# Patient Record
Sex: Female | Born: 1935 | Race: White | Hispanic: No | Smoking: Never smoker
Health system: Southern US, Community
[De-identification: ages and names within clinical notes are randomized; demographics above are authoritative.]

## PROBLEM LIST (undated history)

## (undated) DIAGNOSIS — F329 Major depressive disorder, single episode, unspecified: Secondary | ICD-10-CM

## (undated) DIAGNOSIS — F32A Depression, unspecified: Secondary | ICD-10-CM

## (undated) DIAGNOSIS — C300 Malignant neoplasm of nasal cavity: Secondary | ICD-10-CM

## (undated) DIAGNOSIS — E785 Hyperlipidemia, unspecified: Secondary | ICD-10-CM

## (undated) DIAGNOSIS — I10 Essential (primary) hypertension: Secondary | ICD-10-CM

## (undated) HISTORY — PX: CARPAL TUNNEL RELEASE: SHX101

## (undated) HISTORY — DX: Major depressive disorder, single episode, unspecified: F32.9

## (undated) HISTORY — PX: CATARACT EXTRACTION: SUR2

## (undated) HISTORY — DX: Hyperlipidemia, unspecified: E78.5

## (undated) HISTORY — DX: Depression, unspecified: F32.A

## (undated) HISTORY — DX: Essential (primary) hypertension: I10

---

## 2004-11-05 ENCOUNTER — Ambulatory Visit: Payer: Self-pay | Admitting: Internal Medicine

## 2005-03-05 ENCOUNTER — Ambulatory Visit: Payer: Self-pay | Admitting: Internal Medicine

## 2005-07-12 ENCOUNTER — Ambulatory Visit: Payer: Self-pay | Admitting: Internal Medicine

## 2005-08-28 ENCOUNTER — Ambulatory Visit: Payer: Self-pay | Admitting: Internal Medicine

## 2005-11-12 ENCOUNTER — Ambulatory Visit: Payer: Self-pay | Admitting: Internal Medicine

## 2006-02-11 ENCOUNTER — Ambulatory Visit: Payer: Self-pay | Admitting: Internal Medicine

## 2006-05-13 ENCOUNTER — Ambulatory Visit: Payer: Self-pay | Admitting: Internal Medicine

## 2006-08-18 ENCOUNTER — Encounter (INDEPENDENT_AMBULATORY_CARE_PROVIDER_SITE_OTHER): Payer: Self-pay | Admitting: Specialist

## 2006-08-18 ENCOUNTER — Ambulatory Visit (HOSPITAL_COMMUNITY): Admission: RE | Admit: 2006-08-18 | Discharge: 2006-08-18 | Payer: Self-pay | Admitting: Ophthalmology

## 2006-09-14 ENCOUNTER — Ambulatory Visit: Payer: Self-pay | Admitting: Internal Medicine

## 2006-09-30 ENCOUNTER — Ambulatory Visit: Payer: Self-pay | Admitting: Internal Medicine

## 2006-09-30 LAB — CONVERTED CEMR LAB
ALT: 19 units/L (ref 0–40)
AST: 20 units/L (ref 0–37)
Albumin: 3.9 g/dL (ref 3.5–5.2)
Alkaline Phosphatase: 85 units/L (ref 39–117)
BUN: 26 mg/dL — ABNORMAL HIGH (ref 6–23)
Bilirubin, Direct: 0.2 mg/dL (ref 0.0–0.3)
CO2: 24 meq/L (ref 19–32)
Calcium: 9.5 mg/dL (ref 8.4–10.5)
Chloride: 107 meq/L (ref 96–112)
Chol/HDL Ratio, serum: 3
Cholesterol: 188 mg/dL (ref 0–200)
Creatinine, Ser: 1.2 mg/dL (ref 0.4–1.2)
GFR calc non Af Amer: 47 mL/min
Glomerular Filtration Rate, Af Am: 57 mL/min/{1.73_m2}
Glucose, Bld: 142 mg/dL — ABNORMAL HIGH (ref 70–99)
HDL: 62.3 mg/dL (ref >39.0)
Hgb A1c MFr Bld: 7.1 % — ABNORMAL HIGH (ref 4.6–6.0)
LDL Cholesterol: 111 mg/dL — ABNORMAL HIGH (ref 0–99)
Potassium: 5.4 meq/L — ABNORMAL HIGH (ref 3.5–5.1)
Sodium: 139 meq/L (ref 135–145)
Total Bilirubin: 0.8 mg/dL (ref 0.3–1.2)
Total Protein: 6.7 g/dL (ref 6.0–8.3)
Triglyceride fasting, serum: 75 mg/dL (ref 0–149)
VLDL: 15 mg/dL (ref 0–40)

## 2006-12-30 ENCOUNTER — Encounter: Payer: Self-pay | Admitting: Internal Medicine

## 2006-12-30 ENCOUNTER — Ambulatory Visit: Payer: Self-pay

## 2007-02-10 ENCOUNTER — Ambulatory Visit: Payer: Self-pay | Admitting: Internal Medicine

## 2007-02-10 LAB — CONVERTED CEMR LAB
ALT: 18 units/L (ref 0–40)
AST: 21 units/L (ref 0–37)
Albumin: 3.8 g/dL (ref 3.5–5.2)
Alkaline Phosphatase: 91 units/L (ref 39–117)
BUN: 22 mg/dL (ref 6–23)
Basophils Absolute: 0 10*3/uL (ref 0.0–0.1)
Basophils Relative: 0.5 % (ref 0.0–1.0)
Bilirubin, Direct: 0.1 mg/dL (ref 0.0–0.3)
CO2: 28 meq/L (ref 19–32)
Calcium: 9.2 mg/dL (ref 8.4–10.5)
Chloride: 109 meq/L (ref 96–112)
Cholesterol: 184 mg/dL (ref 0–200)
Creatinine, Ser: 1.1 mg/dL (ref 0.4–1.2)
Eosinophils Absolute: 0.1 10*3/uL (ref 0.0–0.6)
Eosinophils Relative: 1.9 % (ref 0.0–5.0)
GFR calc Af Amer: 63 mL/min
GFR calc non Af Amer: 52 mL/min
Glucose, Bld: 155 mg/dL — ABNORMAL HIGH (ref 70–99)
HCT: 36.9 % (ref 36.0–46.0)
HDL: 57.2 mg/dL (ref >39.0)
Hemoglobin: 12.7 g/dL (ref 12.0–15.0)
LDL Cholesterol: 105 mg/dL — ABNORMAL HIGH (ref 0–99)
Lymphocytes Relative: 25.7 % (ref 12.0–46.0)
MCHC: 34.2 g/dL (ref 30.0–36.0)
MCV: 87.6 fL (ref 78.0–100.0)
Monocytes Absolute: 0.5 10*3/uL (ref 0.2–0.7)
Monocytes Relative: 7 % (ref 3.0–11.0)
Neutro Abs: 4.8 10*3/uL (ref 1.4–7.7)
Neutrophils Relative %: 64.9 % (ref 43.0–77.0)
Platelets: 245 10*3/uL (ref 150–400)
Potassium: 4.9 meq/L (ref 3.5–5.1)
RBC: 4.22 M/uL (ref 3.87–5.11)
RDW: 12.5 % (ref 11.5–14.6)
Sodium: 145 meq/L (ref 135–145)
Total Bilirubin: 0.8 mg/dL (ref 0.3–1.2)
Total CHOL/HDL Ratio: 3.2
Total Protein: 7 g/dL (ref 6.0–8.3)
Triglycerides: 109 mg/dL (ref 0–149)
VLDL: 22 mg/dL (ref 0–40)
WBC: 7.3 10*3/uL (ref 4.5–10.5)

## 2007-05-15 ENCOUNTER — Encounter: Payer: Self-pay | Admitting: Internal Medicine

## 2007-05-15 DIAGNOSIS — E1169 Type 2 diabetes mellitus with other specified complication: Secondary | ICD-10-CM | POA: Insufficient documentation

## 2007-05-15 DIAGNOSIS — E11319 Type 2 diabetes mellitus with unspecified diabetic retinopathy without macular edema: Secondary | ICD-10-CM | POA: Insufficient documentation

## 2007-05-15 DIAGNOSIS — I10 Essential (primary) hypertension: Secondary | ICD-10-CM

## 2007-05-15 DIAGNOSIS — I152 Hypertension secondary to endocrine disorders: Secondary | ICD-10-CM | POA: Insufficient documentation

## 2007-05-15 DIAGNOSIS — E785 Hyperlipidemia, unspecified: Secondary | ICD-10-CM

## 2007-06-23 ENCOUNTER — Ambulatory Visit: Payer: Self-pay | Admitting: Internal Medicine

## 2007-06-23 LAB — CONVERTED CEMR LAB
Cholesterol, target level: 200 mg/dL
HDL goal, serum: 40 mg/dL
LDL Goal: 100 mg/dL

## 2007-06-27 LAB — CONVERTED CEMR LAB
ALT: 14 units/L (ref 0–35)
AST: 19 units/L (ref 0–37)
Albumin: 4.1 g/dL (ref 3.5–5.2)
Alkaline Phosphatase: 85 units/L (ref 39–117)
BUN: 15 mg/dL (ref 6–23)
Bilirubin, Direct: 0.1 mg/dL (ref 0.0–0.3)
CO2: 27 meq/L (ref 19–32)
Calcium: 9.7 mg/dL (ref 8.4–10.5)
Chloride: 106 meq/L (ref 96–112)
Cholesterol: 188 mg/dL (ref 0–200)
Creatinine, Ser: 1.1 mg/dL (ref 0.4–1.2)
GFR calc Af Amer: 62 mL/min
GFR calc non Af Amer: 52 mL/min
Glucose, Bld: 153 mg/dL — ABNORMAL HIGH (ref 70–99)
HDL: 55.8 mg/dL (ref >39.0)
Hgb A1c MFr Bld: 7.6 % — ABNORMAL HIGH (ref 4.6–6.0)
LDL Cholesterol: 113 mg/dL — ABNORMAL HIGH (ref 0–99)
Potassium: 5.5 meq/L — ABNORMAL HIGH (ref 3.5–5.1)
Sodium: 139 meq/L (ref 135–145)
Total Bilirubin: 0.8 mg/dL (ref 0.3–1.2)
Total CHOL/HDL Ratio: 3.4
Total Protein: 7.3 g/dL (ref 6.0–8.3)
Triglycerides: 95 mg/dL (ref 0–149)
VLDL: 19 mg/dL (ref 0–40)

## 2007-08-25 ENCOUNTER — Ambulatory Visit: Payer: Self-pay | Admitting: Internal Medicine

## 2007-11-10 ENCOUNTER — Ambulatory Visit: Payer: Self-pay | Admitting: Internal Medicine

## 2007-11-10 LAB — CONVERTED CEMR LAB
Albumin: 4.1 g/dL (ref 3.5–5.2)
Alkaline Phosphatase: 73 units/L (ref 39–117)
BUN: 19 mg/dL (ref 6–23)
CO2: 28 meq/L (ref 19–32)
GFR calc Af Amer: 56 mL/min
LDL Cholesterol: 84 mg/dL (ref 0–99)
Potassium: 4.5 meq/L (ref 3.5–5.1)
Total CHOL/HDL Ratio: 3
Total Protein: 7 g/dL (ref 6.0–8.3)
Triglycerides: 98 mg/dL (ref 0–149)
VLDL: 20 mg/dL (ref 0–40)

## 2007-12-24 ENCOUNTER — Ambulatory Visit: Payer: Self-pay | Admitting: Internal Medicine

## 2007-12-24 ENCOUNTER — Inpatient Hospital Stay (HOSPITAL_COMMUNITY): Admission: EM | Admit: 2007-12-24 | Discharge: 2007-12-26 | Payer: Self-pay | Admitting: Emergency Medicine

## 2007-12-25 ENCOUNTER — Encounter: Payer: Self-pay | Admitting: Internal Medicine

## 2007-12-25 ENCOUNTER — Ambulatory Visit: Payer: Self-pay | Admitting: Vascular Surgery

## 2008-01-03 ENCOUNTER — Ambulatory Visit: Payer: Self-pay | Admitting: Internal Medicine

## 2008-01-03 DIAGNOSIS — D649 Anemia, unspecified: Secondary | ICD-10-CM | POA: Insufficient documentation

## 2008-01-04 LAB — CONVERTED CEMR LAB
Eosinophils Absolute: 0.1 10*3/uL (ref 0.0–0.6)
Eosinophils Relative: 1.3 % (ref 0.0–5.0)
Ferritin: 52.2 ng/mL (ref 10.0–291.0)
HCT: 35.6 % — ABNORMAL LOW (ref 36.0–46.0)
Iron: 29 ug/dL — ABNORMAL LOW (ref 42–145)
MCV: 90.5 fL (ref 78.0–100.0)
Neutrophils Relative %: 67.9 % (ref 43.0–77.0)
RBC: 3.93 M/uL (ref 3.87–5.11)
Saturation Ratios: 9 % — ABNORMAL LOW (ref 20.0–50.0)
Transferrin: 230.8 mg/dL (ref >=212.0)
WBC: 8.4 10*3/uL (ref 4.5–10.5)

## 2008-01-08 LAB — CONVERTED CEMR LAB
Lymphocytes Relative: 24 % (ref 12–46)
Lymphs Abs: 2 10*3/uL (ref 0.7–4.0)
Neutro Abs: 5.6 10*3/uL (ref 1.7–7.7)
Neutrophils Relative %: 67 % (ref 43–77)
Platelets: 300 10*3/uL (ref 150–400)
WBC: 8.4 10*3/uL (ref 4.0–10.5)

## 2008-01-31 ENCOUNTER — Telehealth: Payer: Self-pay | Admitting: Internal Medicine

## 2008-01-31 ENCOUNTER — Ambulatory Visit: Payer: Self-pay | Admitting: Internal Medicine

## 2008-02-01 ENCOUNTER — Telehealth: Payer: Self-pay | Admitting: Internal Medicine

## 2008-02-01 ENCOUNTER — Ambulatory Visit: Payer: Self-pay | Admitting: Internal Medicine

## 2008-02-01 LAB — CONVERTED CEMR LAB: Potassium: 5.5 meq/L — ABNORMAL HIGH (ref 3.5–5.1)

## 2008-02-06 LAB — CONVERTED CEMR LAB
ALT: 16 units/L (ref 0–35)
Basophils Absolute: 0 10*3/uL (ref 0.0–0.1)
Bilirubin, Direct: 0.1 mg/dL (ref 0.0–0.3)
CO2: 28 meq/L (ref 19–32)
Calcium: 9.5 mg/dL (ref 8.4–10.5)
Chloride: 111 meq/L (ref 96–112)
Cholesterol: 169 mg/dL (ref 0–200)
Eosinophils Absolute: 0.1 10*3/uL (ref 0.0–0.7)
GFR calc non Af Amer: 47 mL/min
Hgb A1c MFr Bld: 6.9 % — ABNORMAL HIGH (ref 4.6–6.0)
LDL Cholesterol: 93 mg/dL (ref 0–99)
Lymphocytes Relative: 28.1 % (ref 12.0–46.0)
MCHC: 32.6 g/dL (ref 30.0–36.0)
MCV: 89.3 fL (ref 78.0–100.0)
Neutro Abs: 4.8 10*3/uL (ref 1.4–7.7)
Neutrophils Relative %: 62.2 % (ref 43.0–77.0)
RDW: 13.4 % (ref 11.5–14.6)
Sodium: 143 meq/L (ref 135–145)
Total Bilirubin: 0.8 mg/dL (ref 0.3–1.2)
Triglycerides: 92 mg/dL (ref 0–149)
VLDL: 18 mg/dL (ref 0–40)

## 2008-02-09 ENCOUNTER — Ambulatory Visit: Payer: Self-pay | Admitting: Internal Medicine

## 2008-02-09 LAB — CONVERTED CEMR LAB
CO2: 30 meq/L (ref 19–32)
Chloride: 104 meq/L (ref 96–112)
Creatinine, Ser: 1 mg/dL (ref 0.4–1.2)
GFR calc non Af Amer: 58 mL/min
Potassium: 4.3 meq/L (ref 3.5–5.1)

## 2008-02-12 ENCOUNTER — Encounter: Payer: Self-pay | Admitting: Internal Medicine

## 2008-03-15 ENCOUNTER — Ambulatory Visit: Payer: Self-pay | Admitting: Gastroenterology

## 2008-03-29 ENCOUNTER — Ambulatory Visit: Payer: Self-pay | Admitting: Gastroenterology

## 2008-03-29 ENCOUNTER — Encounter: Payer: Self-pay | Admitting: Gastroenterology

## 2008-03-29 LAB — HM COLONOSCOPY

## 2008-04-03 ENCOUNTER — Encounter: Payer: Self-pay | Admitting: Gastroenterology

## 2008-05-24 ENCOUNTER — Encounter: Payer: Self-pay | Admitting: Internal Medicine

## 2008-05-31 ENCOUNTER — Ambulatory Visit: Payer: Self-pay | Admitting: Internal Medicine

## 2008-05-31 LAB — CONVERTED CEMR LAB
Albumin: 4 g/dL (ref 3.5–5.2)
Alkaline Phosphatase: 83 units/L (ref 39–117)
BUN: 17 mg/dL (ref 6–23)
Calcium: 9.3 mg/dL (ref 8.4–10.5)
Cholesterol: 150 mg/dL (ref 0–200)
Creatinine, Ser: 1 mg/dL (ref 0.4–1.2)
GFR calc Af Amer: 70 mL/min
GFR calc non Af Amer: 57 mL/min
Glucose, Bld: 118 mg/dL — ABNORMAL HIGH (ref 70–99)
HDL: 59.6 mg/dL (ref >39.0)
Hgb A1c MFr Bld: 7.2 % — ABNORMAL HIGH (ref 4.6–6.0)
LDL Cholesterol: 75 mg/dL (ref 0–99)
Potassium: 4.2 meq/L (ref 3.5–5.1)
Total Protein: 7.2 g/dL (ref 6.0–8.3)
Triglycerides: 75 mg/dL (ref 0–149)
VLDL: 15 mg/dL (ref 0–40)

## 2008-06-14 ENCOUNTER — Ambulatory Visit: Payer: Self-pay | Admitting: Internal Medicine

## 2008-06-14 DIAGNOSIS — G4700 Insomnia, unspecified: Secondary | ICD-10-CM | POA: Insufficient documentation

## 2008-06-14 DIAGNOSIS — F329 Major depressive disorder, single episode, unspecified: Secondary | ICD-10-CM | POA: Insufficient documentation

## 2008-06-14 DIAGNOSIS — Z8659 Personal history of other mental and behavioral disorders: Secondary | ICD-10-CM | POA: Insufficient documentation

## 2008-08-09 ENCOUNTER — Ambulatory Visit: Payer: Self-pay | Admitting: Internal Medicine

## 2008-09-27 ENCOUNTER — Encounter: Payer: Self-pay | Admitting: Internal Medicine

## 2008-10-04 ENCOUNTER — Ambulatory Visit: Payer: Self-pay | Admitting: Internal Medicine

## 2008-10-08 LAB — CONVERTED CEMR LAB
ALT: 12 units/L (ref 0–35)
AST: 19 units/L (ref 0–37)
Bilirubin, Direct: 0.1 mg/dL (ref 0.0–0.3)
CO2: 28 meq/L (ref 19–32)
Calcium: 9.5 mg/dL (ref 8.4–10.5)
Chloride: 106 meq/L (ref 96–112)
Creatinine, Ser: 1 mg/dL (ref 0.4–1.2)
Glucose, Bld: 129 mg/dL — ABNORMAL HIGH (ref 70–99)
HDL: 70.2 mg/dL (ref >39.0)
Hgb A1c MFr Bld: 6.9 % — ABNORMAL HIGH (ref 4.6–6.0)
Total Bilirubin: 0.8 mg/dL (ref 0.3–1.2)
Total CHOL/HDL Ratio: 2.2
Total Protein: 7.1 g/dL (ref 6.0–8.3)
Triglycerides: 77 mg/dL (ref 0–149)

## 2008-10-11 ENCOUNTER — Ambulatory Visit: Payer: Self-pay | Admitting: Internal Medicine

## 2008-10-11 DIAGNOSIS — G252 Other specified forms of tremor: Secondary | ICD-10-CM

## 2008-10-11 DIAGNOSIS — G250 Essential tremor: Secondary | ICD-10-CM | POA: Insufficient documentation

## 2008-11-15 ENCOUNTER — Ambulatory Visit: Payer: Self-pay | Admitting: Internal Medicine

## 2009-02-07 ENCOUNTER — Ambulatory Visit: Payer: Self-pay | Admitting: Internal Medicine

## 2009-02-07 LAB — CONVERTED CEMR LAB
BUN: 16 mg/dL (ref 6–23)
Bilirubin, Direct: 0.1 mg/dL (ref 0.0–0.3)
CO2: 28 meq/L (ref 19–32)
Chloride: 107 meq/L (ref 96–112)
Glucose, Bld: 112 mg/dL — ABNORMAL HIGH (ref 70–99)
HDL: 60.8 mg/dL (ref >39.00)
Potassium: 4.7 meq/L (ref 3.5–5.1)
Sodium: 141 meq/L (ref 135–145)
Total Bilirubin: 1.1 mg/dL (ref 0.3–1.2)
Total CHOL/HDL Ratio: 3
VLDL: 19.2 mg/dL (ref 0.0–40.0)

## 2009-02-10 LAB — CONVERTED CEMR LAB: Nitrite: POSITIVE

## 2009-02-21 ENCOUNTER — Ambulatory Visit: Payer: Self-pay | Admitting: Internal Medicine

## 2009-08-01 ENCOUNTER — Ambulatory Visit: Payer: Self-pay | Admitting: Internal Medicine

## 2009-08-15 ENCOUNTER — Ambulatory Visit: Payer: Self-pay | Admitting: Internal Medicine

## 2009-08-22 ENCOUNTER — Ambulatory Visit: Payer: Self-pay | Admitting: Internal Medicine

## 2009-08-22 LAB — CONVERTED CEMR LAB
AST: 20 units/L (ref 0–37)
Albumin: 4.1 g/dL (ref 3.5–5.2)
Basophils Relative: 0.1 % (ref 0.0–3.0)
CO2: 26 meq/L (ref 19–32)
Calcium: 9.4 mg/dL (ref 8.4–10.5)
Chloride: 100 meq/L (ref 96–112)
Eosinophils Absolute: 0 10*3/uL (ref 0.0–0.7)
Ferritin: 33.7 ng/mL (ref 10.0–291.0)
HCT: 34.2 % — ABNORMAL LOW (ref 36.0–46.0)
HDL: 71.2 mg/dL (ref >39.00)
Hemoglobin: 11.7 g/dL — ABNORMAL LOW (ref 12.0–15.0)
LDL Cholesterol: 92 mg/dL (ref 0–99)
Lymphocytes Relative: 6.1 % — ABNORMAL LOW (ref 12.0–46.0)
MCHC: 34.3 g/dL (ref 30.0–36.0)
Monocytes Relative: 2.3 % — ABNORMAL LOW (ref 3.0–12.0)
Neutro Abs: 12.5 10*3/uL — ABNORMAL HIGH (ref 1.4–7.7)
Potassium: 4.5 meq/L (ref 3.5–5.1)
RBC: 3.72 M/uL — ABNORMAL LOW (ref 3.87–5.11)
Saturation Ratios: 11 % — ABNORMAL LOW (ref 20.0–50.0)
Sodium: 139 meq/L (ref 135–145)
Total CHOL/HDL Ratio: 2
Triglycerides: 48 mg/dL (ref 0.0–149.0)
VLDL: 9.6 mg/dL (ref 0.0–40.0)

## 2009-09-11 ENCOUNTER — Ambulatory Visit: Payer: Self-pay | Admitting: Internal Medicine

## 2009-10-06 LAB — HM DEXA SCAN

## 2009-12-03 ENCOUNTER — Encounter: Payer: Self-pay | Admitting: Internal Medicine

## 2009-12-03 LAB — HM DIABETES EYE EXAM: HM Diabetic Eye Exam: NORMAL

## 2009-12-05 ENCOUNTER — Ambulatory Visit: Payer: Self-pay | Admitting: Internal Medicine

## 2009-12-08 ENCOUNTER — Encounter: Payer: Self-pay | Admitting: Internal Medicine

## 2009-12-10 LAB — CONVERTED CEMR LAB
AST: 20 units/L (ref 0–37)
Albumin: 4.1 g/dL (ref 3.5–5.2)
Alkaline Phosphatase: 77 units/L (ref 39–117)
Calcium: 9.5 mg/dL (ref 8.4–10.5)
Cholesterol: 162 mg/dL (ref 0–200)
Creatinine, Ser: 1.1 mg/dL (ref 0.4–1.2)
Hgb A1c MFr Bld: 7.2 % — ABNORMAL HIGH (ref 4.6–6.5)
Total Protein: 6.8 g/dL (ref 6.0–8.3)
Triglycerides: 89 mg/dL (ref 0.0–149.0)

## 2009-12-12 ENCOUNTER — Ambulatory Visit: Payer: Self-pay | Admitting: Internal Medicine

## 2009-12-12 DIAGNOSIS — M810 Age-related osteoporosis without current pathological fracture: Secondary | ICD-10-CM | POA: Insufficient documentation

## 2009-12-18 ENCOUNTER — Encounter: Payer: Self-pay | Admitting: Internal Medicine

## 2009-12-19 ENCOUNTER — Encounter: Payer: Self-pay | Admitting: Internal Medicine

## 2009-12-26 ENCOUNTER — Ambulatory Visit (HOSPITAL_COMMUNITY): Admission: RE | Admit: 2009-12-26 | Discharge: 2009-12-26 | Payer: Self-pay | Admitting: Internal Medicine

## 2010-01-09 ENCOUNTER — Ambulatory Visit: Payer: Self-pay | Admitting: Family Medicine

## 2010-01-09 LAB — CONVERTED CEMR LAB
Nitrite: NEGATIVE
Protein, U semiquant: 30
pH: 5

## 2010-04-10 ENCOUNTER — Ambulatory Visit: Payer: Self-pay | Admitting: Internal Medicine

## 2010-04-10 LAB — CONVERTED CEMR LAB
ALT: 13 units/L (ref 0–35)
AST: 21 units/L (ref 0–37)
BUN: 24 mg/dL — ABNORMAL HIGH (ref 6–23)
Bilirubin, Direct: 0.1 mg/dL (ref 0.0–0.3)
CO2: 30 meq/L (ref 19–32)
GFR calc non Af Amer: 58.49 mL/min (ref >60)
Glucose, Bld: 117 mg/dL — ABNORMAL HIGH (ref 70–99)
Potassium: 5.9 meq/L — ABNORMAL HIGH (ref 3.5–5.1)
Total Bilirubin: 0.5 mg/dL (ref 0.3–1.2)
VLDL: 15.2 mg/dL (ref 0.0–40.0)

## 2010-04-17 ENCOUNTER — Ambulatory Visit: Payer: Self-pay | Admitting: Internal Medicine

## 2010-05-29 ENCOUNTER — Ambulatory Visit: Payer: Self-pay | Admitting: Internal Medicine

## 2010-05-29 LAB — CONVERTED CEMR LAB
Ketones, urine, test strip: NEGATIVE
Nitrite: POSITIVE
Protein, U semiquant: NEGATIVE
Urobilinogen, UA: 0.2

## 2010-05-30 ENCOUNTER — Encounter: Payer: Self-pay | Admitting: Internal Medicine

## 2010-07-24 ENCOUNTER — Ambulatory Visit: Payer: Self-pay | Admitting: Internal Medicine

## 2010-10-02 ENCOUNTER — Ambulatory Visit: Payer: Self-pay | Admitting: Internal Medicine

## 2010-10-02 LAB — CONVERTED CEMR LAB
Alkaline Phosphatase: 67 units/L (ref 39–117)
Bilirubin, Direct: 0.1 mg/dL (ref 0.0–0.3)
Calcium: 9.3 mg/dL (ref 8.4–10.5)
Creatinine, Ser: 0.9 mg/dL (ref 0.4–1.2)
Direct LDL: 129.9 mg/dL
GFR calc non Af Amer: 65.28 mL/min (ref >60.00)
HDL: 56.7 mg/dL (ref >39.00)
Sodium: 143 meq/L (ref 135–145)
Total Bilirubin: 0.6 mg/dL (ref 0.3–1.2)
Total CHOL/HDL Ratio: 4
Triglycerides: 161 mg/dL — ABNORMAL HIGH (ref 0.0–149.0)
VLDL: 32.2 mg/dL (ref 0.0–40.0)

## 2010-10-09 ENCOUNTER — Ambulatory Visit: Payer: Self-pay | Admitting: Internal Medicine

## 2010-11-24 NOTE — Letter (Signed)
Summary: Diabetic Eye Exam/[facility]  Diabetic Eye Exam/[facility]   Imported By: Maryln Gottron 12/15/2009 14:30:26  _____________________________________________________________________  External Attachment:    Type:   Image     Comment:   External Document

## 2010-11-24 NOTE — Assessment & Plan Note (Signed)
Summary: flu shot/cjr  Nurse Visit   Allergies: 1)  ! Codeine Sulfate (Codeine Sulfate) 2)  ! Fosamax 3)  ! Boniva (Ibandronate Sodium)  Orders Added: 1)  Flu Vaccine 98yrs + MEDICARE PATIENTS [Q2039] 2)  Administration Flu vaccine - MCR [G0008]       Flu Vaccine Consent Questions     Do you have a history of severe allergic reactions to this vaccine? no    Any prior history of allergic reactions to egg and/or gelatin? no    Do you have a sensitivity to the preservative Thimersol? no    Do you have a past history of Guillan-Barre Syndrome? no    Do you currently have an acute febrile illness? no    Have you ever had a severe reaction to latex? no    Vaccine information given and explained to patient? yes    Are you currently pregnant? no    Lot Number:[id number]   Exp Date:04/24/2011   Site Given  Left Deltoid IM

## 2010-11-24 NOTE — Assessment & Plan Note (Signed)
Summary: 4 month fup//ccm   Vital Signs:  Patient profile:   77 year old female Weight:      150 pounds Temp:     98.8 degrees F oral Pulse rate:   80 / minute Pulse rhythm:   irregular Resp:     12 per minute BP sitting:   130 / 68  (left arm) Cuff size:   regular  Vitals Entered By: Gladis Riffle, RN (April 17, 2010 8:55 AM) CC: 4 month rov, labs done--CBGs <120 at home, states up some from knee injection Is Patient Diabetic? Yes Did you bring your meter with you today? No   CC:  4 month rov, labs done--CBGs <120 at home, and states up some from knee injection.  History of Present Illness:  Follow-Up Visit      This is a 77 year old woman who presents for Follow-up visit.  The patient denies chest pain and palpitations.  Since the last visit the patient notes no new problems or concerns and problems with medications.  The patient reports taking meds as prescribed and not taking meds as prescribed.  When questioned about possible medication side effects, the patient notes none.   steroid injection to knee caused CBS to increase to 150---now back to low 100s  All other systems reviewed and were negative   Preventive Screening-Counseling & Management  Alcohol-Tobacco     Smoking Status: never  Current Problems (verified): 1)  Osteoporosis  (ICD-733.00) 2)  Intention Tremor  (ICD-333.1) 3)  Insomnia-sleep Disorder-unspec  (ICD-780.52) 4)  Depression  (ICD-311) 5)  Anemia, Other Unspec  (ICD-285.9) 6)  Family History of Colon Ca 1st Degree Relative <60  (ICD-V16.0) 7)  Hypertension  (ICD-401.9) 8)  Hyperlipidemia  (ICD-272.4) 9)  Diabetes Mellitus, Type II  (ICD-250.00)  Current Medications (verified): 1)  Amlodipine Besylate 5 Mg  Tabs (Amlodipine Besylate) .... Take 1/2 Tablet By Mouth Once A Day 2)  Simvastatin 40 Mg Tabs (Simvastatin) .... Take 1 Tablet By Mouth At Bedtime 3)  Metformin Hcl 850 Mg Tabs (Metformin Hcl) .... Two Times A Day 4)  Caltrate 600+d 600-400  Mg-Unit  Tabs (Calcium Carbonate-Vitamin D) .... Two Times A Day 5)  Aspirin Ec 325 Mg  Tbec (Aspirin) .... Once Daily 6)  Zolpidem Tartrate 10 Mg  Tabs (Zolpidem Tartrate) .... Take 1 Tab By Mouth At Bedtime As Needed or As Directed 7)  Propranolol Hcl 10 Mg Tabs (Propranolol Hcl) .... Take 1 Tablet By Mouth Two Times A Day (Take in The Morning and 6 Hours Later) 8)  Paroxetine Hcl 40 Mg Tabs (Paroxetine Hcl) .... Take 1 Tablet By Mouth Once A Day  Allergies: 1)  ! Codeine Sulfate (Codeine Sulfate) 2)  ! Fosamax 3)  ! Boniva (Ibandronate Sodium)  Physical Exam  General:  Well-developed,well-nourished,in no acute distress; alert,appropriate and cooperative throughout examination Head:  normocephalic and atraumatic.   Eyes:  pupils equal and pupils round.   Ears:  R ear normal and L ear normal.   Neck:  No deformities, masses, or tenderness noted. Lungs:  Normal respiratory effort, chest expands symmetrically. Lungs are clear to auscultation, no crackles or wheezes. Heart:  normal rate and regular rhythm.   Abdomen:  Bowel sounds positive,abdomen soft and non-tender without masses, organomegaly or hernias noted. Msk:  no CVA tenderness. Neurologic:  cranial nerves II-XII intact and gait normal.     Impression & Recommendations:  Problem # 1:  INTENTION TREMOR (ICD-333.1) doing well on meds (propranolol)  Problem # 2:  DIABETES MELLITUS, TYPE II (ICD-250.00) reasonable control Her updated medication list for this problem includes:    Metformin Hcl 850 Mg Tabs (Metformin hcl) .Marland Kitchen..Marland Kitchen Two times a day    Aspirin Ec 325 Mg Tbec (Aspirin) ..... Once daily  Labs Reviewed: Creat: 1.0 (04/10/2010)     Last Eye Exam: normal (12/03/2009) Reviewed HgBA1c results: 7.1 (04/10/2010)  7.2 (12/05/2009)  Problem # 3:  HYPERLIPIDEMIA (ICD-272.4) controlled continue current medications  Her updated medication list for this problem includes:    Simvastatin 40 Mg Tabs (Simvastatin) .Marland Kitchen... Take 1  tablet by mouth at bedtime  Labs Reviewed: SGOT: 21 (04/10/2010)   SGPT: 13 (04/10/2010)  Lipid Goals: Chol Goal: 200 (06/23/2007)   HDL Goal: 40 (06/23/2007)   LDL Goal: 100 (06/23/2007)   TG Goal: 150 (06/23/2007)  Prior 10 Yr Risk Heart Disease: 17 % (06/23/2007)   HDL:68.40 (04/10/2010), 65.70 (12/05/2009)  LDL:84 (04/10/2010), 79 (12/05/2009)  Chol:168 (04/10/2010), 162 (12/05/2009)  Trig:76.0 (04/10/2010), 89.0 (12/05/2009)  Problem # 4:  DEPRESSION (ICD-311) doing well on meds Her updated medication list for this problem includes:    Paroxetine Hcl 40 Mg Tabs (Paroxetine hcl) .Marland Kitchen... Take 1 tablet by mouth once a day  Complete Medication List: 1)  Amlodipine Besylate 5 Mg Tabs (Amlodipine besylate) .... Take 1/2 tablet by mouth once a day 2)  Simvastatin 40 Mg Tabs (Simvastatin) .... Take 1 tablet by mouth at bedtime 3)  Metformin Hcl 850 Mg Tabs (Metformin hcl) .... Two times a day 4)  Caltrate 600+d 600-400 Mg-unit Tabs (Calcium carbonate-vitamin d) .... Two times a day 5)  Aspirin Ec 325 Mg Tbec (Aspirin) .... Once daily 6)  Zolpidem Tartrate 10 Mg Tabs (Zolpidem tartrate) .... Take 1 tab by mouth at bedtime as needed or as directed 7)  Propranolol Hcl 10 Mg Tabs (Propranolol hcl) .... Take 1 tablet by mouth two times a day (take in the morning and 6 hours later) 8)  Paroxetine Hcl 40 Mg Tabs (Paroxetine hcl) .... Take 1 tablet by mouth once a day  Patient Instructions: 1)  Please schedule a follow-up appointment in 6 months. 2)  labs one week prior to visit 3)  lipids---272.4 4)  lfts-995.2 5)  bmet-995.2 6)  A1C-250.02 7)      Appended Document: 4 month fup//ccm   Immunizations Administered:  Tetanus Vaccine:    Vaccine Type: Td    Site: left deltoid    Mfr: Sanofi Pasteur    Dose: 0.5 ml    Route: IM    Given by: Gladis Riffle, RN    Exp. Date: 11/26/2011    Lot #: [id number]  Zostavax # 1:    Vaccine Type: Zostavax    Site: right deltoid    Mfr: Merck     Dose: 0.5 ml    Route: [place]    Given by: Gladis Riffle, RN    Exp. Date: 05/20/2011    Lot #: [id number]    VIS given: 08/06/05 given April 17, 2010.

## 2010-11-24 NOTE — Miscellaneous (Signed)
Summary: diabetic eye exam  Clinical Lists Changes  Observations: Added new observation of EYES COMMENT: 11/2010 (12/08/2009 14:59) Added new observation of EYE EXAM BY: Elpidio Galea MD (12/03/2009 15:01) Added new observation of DMEYEEXMRES: normal (12/03/2009 15:01) Added new observation of DIAB EYE EX: normal (12/03/2009 15:01)      Diabetes Management History:      She has not been enrolled in the "Diabetic Education Program".  She is checking home blood sugars.  She says that she is not exercising regularly.    Diabetes Management Exam:    Eye Exam:       Eye Exam done elsewhere          Date: 12/03/2009          Results: normal          Done by: Elpidio Galea MD  Diabetes Management Assessment/Plan:      The following lipid goals have been established for the patient: Total cholesterol goal of 200; LDL cholesterol goal of 100; HDL cholesterol goal of 40; Triglyceride goal of 150.

## 2010-11-24 NOTE — Op Note (Signed)
Summary: [facility]   Imported By: Maryln Gottron 12/22/2009 14:44:24  _____________________________________________________________________  External Attachment:    Type:   Image     Comment:   External Document

## 2010-11-24 NOTE — Assessment & Plan Note (Signed)
Summary: 4 month rov/njr--discuss bone density/et   Vital Signs:  Patient profile:   77 year old female Height:      62 inches Weight:      149 pounds BMI:     27.35 Temp:     98.5 degrees F oral Pulse rate:   78 / minute Pulse rhythm:   irregular BP sitting:   128 / 80  (left arm) Cuff size:   regular  Vitals Entered By: Kern Reap CMA Duncan Dull) (December 12, 2009 9:28 AM)  Reason for Visit follow up DM and bone density  Is Patient Diabetic? Yes Did you bring your meter with you today? No   Allergies: 1)  ! Codeine Sulfate (Codeine Sulfate)  Past History:  Past Surgical History: Last updated: 05/15/2007 Caesarean section X1 Cataract extraction CT surgery  2007 L CTS  Family History: Last updated: 06/23/2007 Family History of Colon CA 1st degree relative mother Family History Lung cancer  father MI age 78 brother deceased with stomach CA---MI  Social History: Last updated: 06/23/2007 Single Never Smoked Regular exercise-no  Risk Factors: Exercise: no (06/23/2007)  Risk Factors: Smoking Status: never (08/22/2009)  Past Medical History: Diabetes mellitus, type II Hyperlipidemia Hypertension Depression Osteoporosis  Physical Exam  General:  Well-developed,well-nourished,in no acute distress; alert,appropriate and cooperative throughout examination Head:  normocephalic and atraumatic.   Eyes:  pupils equal and pupils round.   Ears:  R ear normal and L ear normal.   Neck:  No deformities, masses, or tenderness noted. Chest Wall:  No deformities, masses, or tenderness noted. Lungs:  Normal respiratory effort, chest expands symmetrically. Lungs are clear to auscultation, no crackles or wheezes. Heart:  Normal rate and regular rhythm. S1 and S2 normal without gallop, murmur, click, rub or other extra sounds. Abdomen:  Bowel sounds positive,abdomen soft and non-tender without masses, organomegaly or hernias noted. Msk:  No deformity or scoliosis noted  of thoracic or lumbar spine.   Neurologic:  cranial nerves II-XII intact and gait normal.    Diabetes Management Exam:    Eye Exam:       Eye Exam done elsewhere   Impression & Recommendations:  Problem # 1:  HYPERTENSION (ICD-401.9) reasonable control Her updated medication list for this problem includes:    Amlodipine Besylate 5 Mg Tabs (Amlodipine besylate) .Marland Kitchen... Take 1/2 tablet by mouth once a day    Propranolol Hcl 10 Mg Tabs (Propranolol hcl) .Marland Kitchen... Take 1 tablet by mouth two times a day (take in the morning and 6 hours later)  BP today: 128/80 Prior BP: 120/80 (08/22/2009)  Prior 10 Yr Risk Heart Disease: 17 % (06/23/2007)  Labs Reviewed: K+: 5.4 (12/05/2009) Creat: : 1.1 (12/05/2009)   Chol: 162 (12/05/2009)   HDL: 65.70 (12/05/2009)   LDL: 79 (12/05/2009)   TG: 89.0 (12/05/2009)  Problem # 2:  DIABETES MELLITUS, TYPE II (ICD-250.00) could be better she admits to dietary noncompliance over the "holidays" Her updated medication list for this problem includes:    Metformin Hcl 850 Mg Tabs (Metformin hcl) .Marland Kitchen..Marland Kitchen Two times a day    Aspirin Ec 325 Mg Tbec (Aspirin) ..... Once daily  Labs Reviewed: Creat: 1.1 (12/05/2009)     Last Eye Exam: normal (12/03/2009) Reviewed HgBA1c results: 7.2 (12/05/2009)  7.0 (08/15/2009)  Problem # 3:  OSTEOPOROSIS (ICD-733.00)  discussed at length- trial reclast side effects discussed  Orders: Short Stay Referral (Short Stay)  Problem # 4:  DEPRESSION (ICD-311) doing well on meds Her updated medication list for  this problem includes:    Paroxetine Hcl 40 Mg Tabs (Paroxetine hcl) .Marland Kitchen... Take 1 tablet by mouth once a day  Problem # 5:  ANEMIA, OTHER UNSPEC (ICD-285.9) will recheck with next labs The following medications were removed from the medication list:    Ferrous Sulfate 325 (65 Fe) Mg Tbec (Ferrous sulfate) ..... Once daily  Hgb: 11.7 (08/15/2009)   Hct: 34.2 (08/15/2009)   Platelets: 231.0 (08/15/2009) RBC: 3.72  (08/15/2009)   RDW: 12.4 (08/15/2009)   WBC: 13.6 (08/15/2009) MCV: 92.0 (08/15/2009)   MCHC: 34.3 (08/15/2009) Ferritin: 33.7 (08/15/2009) Iron: 41 (08/15/2009)   % Sat: 11.0 (08/15/2009) B12: 230 (01/03/2008)   Folate: 10.1 (01/03/2008)     Complete Medication List: 1)  Amlodipine Besylate 5 Mg Tabs (Amlodipine besylate) .... Take 1/2 tablet by mouth once a day 2)  Simvastatin 40 Mg Tabs (Simvastatin) .... Take 1 tablet by mouth at bedtime 3)  Metformin Hcl 850 Mg Tabs (Metformin hcl) .... Two times a day 4)  Caltrate 600+d 600-400 Mg-unit Tabs (Calcium carbonate-vitamin d) .... Two times a day 5)  Aspirin Ec 325 Mg Tbec (Aspirin) .... Once daily 6)  Zolpidem Tartrate 10 Mg Tabs (Zolpidem tartrate) .... Take 1 tab by mouth at bedtime as needed or as directed 7)  Propranolol Hcl 10 Mg Tabs (Propranolol hcl) .... Take 1 tablet by mouth two times a day (take in the morning and 6 hours later) 8)  Paroxetine Hcl 40 Mg Tabs (Paroxetine hcl) .... Take 1 tablet by mouth once a day  Patient Instructions: 1)  Please schedule a follow-up appointment in 4 months. 2)  labs one week prior to visit 3)  lipids---272.4 4)  lfts-995.2 5)  bmet-995.2 6)  A1C-250.02 7)      Appended Document: 4 month rov/njr--discuss bone density/et

## 2010-11-24 NOTE — Assessment & Plan Note (Signed)
Summary: UTI/dm   History of Present Illness: dysuria for several days no fever or chills no back pain  All other systems reviewed and were negative   Current Problems (verified): 1)  Osteoporosis  (ICD-733.00) 2)  Intention Tremor  (ICD-333.1) 3)  Insomnia-sleep Disorder-unspec  (ICD-780.52) 4)  Depression  (ICD-311) 5)  Anemia, Other Unspec  (ICD-285.9) 6)  Family History of Colon Ca 1st Degree Relative <60  (ICD-V16.0) 7)  Hypertension  (ICD-401.9) 8)  Hyperlipidemia  (ICD-272.4) 9)  Diabetes Mellitus, Type II  (ICD-250.00)  Current Medications (verified): 1)  Amlodipine Besylate 5 Mg  Tabs (Amlodipine Besylate) .... Take 1/2 Tablet By Mouth Once A Day 2)  Simvastatin 40 Mg Tabs (Simvastatin) .... Take 1 Tablet By Mouth At Bedtime 3)  Metformin Hcl 850 Mg Tabs (Metformin Hcl) .... Two Times A Day 4)  Caltrate 600+d 600-400 Mg-Unit  Tabs (Calcium Carbonate-Vitamin D) .... Two Times A Day 5)  Aspirin Ec 325 Mg  Tbec (Aspirin) .... Once Daily 6)  Zolpidem Tartrate 10 Mg  Tabs (Zolpidem Tartrate) .... Take 1 Tab By Mouth At Bedtime As Needed or As Directed 7)  Propranolol Hcl 10 Mg Tabs (Propranolol Hcl) .... Take 1 Tablet By Mouth Two Times A Day (Take in The Morning and 6 Hours Later) 8)  Paroxetine Hcl 40 Mg Tabs (Paroxetine Hcl) .... Take 1 Tablet By Mouth Once A Day  Allergies (verified): 1)  ! Codeine Sulfate (Codeine Sulfate) 2)  ! Fosamax 3)  ! Boniva (Ibandronate Sodium)  Past History:  Past Medical History: Last updated: 12/12/2009 Diabetes mellitus, type II Hyperlipidemia Hypertension Depression Osteoporosis  Past Surgical History: Last updated: 05/15/2007 Caesarean section X1 Cataract extraction CT surgery  2007 L CTS  Family History: Last updated: 06/23/2007 Family History of Colon CA 1st degree relative mother Family History Lung cancer  father MI age 67 brother deceased with stomach CA---MI  Social History: Last updated:  06/23/2007 Single Never Smoked Regular exercise-no  Risk Factors: Exercise: no (06/23/2007)  Risk Factors: Smoking Status: never (04/17/2010)  Physical Exam  General:  Well-developed,well-nourished,in no acute distress; alert,appropriate and cooperative throughout examination Head:  normocephalic and atraumatic.   Ears:  R ear normal and L ear normal.   Neck:  No deformities, masses, or tenderness noted. Chest Wall:  No deformities, masses, or tenderness noted. Genitalia:  no cva or suprapubic tenderness   Impression & Recommendations:  Problem # 1:  UTI (ICD-599.0)  see new meds side effects discussed Orders: Urinalysis-dipstick only (Medicare patient) ([id number]) T-Culture, Urine ([id number])  Her updated medication list for this problem includes:    Cipro 250 Mg Tabs (Ciprofloxacin hcl) .Marland Kitchen... 1 by mouth 2 times daily  Complete Medication List: 1)  Amlodipine Besylate 5 Mg Tabs (Amlodipine besylate) .... Take 1/2 tablet by mouth once a day 2)  Simvastatin 40 Mg Tabs (Simvastatin) .... Take 1 tablet by mouth at bedtime 3)  Metformin Hcl 850 Mg Tabs (Metformin hcl) .... Two times a day 4)  Caltrate 600+d 600-400 Mg-unit Tabs (Calcium carbonate-vitamin d) .... Two times a day 5)  Aspirin Ec 325 Mg Tbec (Aspirin) .... Once daily 6)  Zolpidem Tartrate 10 Mg Tabs (Zolpidem tartrate) .... Take 1 tab by mouth at bedtime as needed or as directed 7)  Propranolol Hcl 10 Mg Tabs (Propranolol hcl) .... Take 1 tablet by mouth two times a day (take in the morning and 6 hours later) 8)  Paroxetine Hcl 40 Mg Tabs (Paroxetine hcl) .... Take 1 tablet by  mouth once a day 9)  Cipro 250 Mg Tabs (Ciprofloxacin hcl) .Marland Kitchen.. 1 by mouth 2 times daily Prescriptions: CIPRO 250 MG TABS (CIPROFLOXACIN HCL) 1 by mouth 2 times daily  #10 x 0   Entered and Authorized by:   Birdie Sons MD   Signed by:   Birdie Sons MD on 05/29/2010   Method used:   Electronically to        [facility]  [phone]* (retail)       [address]       [place], [place]  [address]       Ph: [phone] or [phone]       Fax: [phone]   RxID:   [id number]   Laboratory Results   Urine Tests    Routine Urinalysis   Color: yellow Appearance: Clear Glucose: negative   (Normal Range: Negative) Bilirubin: negative   (Normal Range: Negative) Ketone: negative   (Normal Range: Negative) Spec. Gravity: 1.020   (Normal Range: 1.003-1.035) Blood: 1+   (Normal Range: Negative) pH: 5.5   (Normal Range: 5.0-8.0) Protein: negative   (Normal Range: Negative) Urobilinogen: 0.2   (Normal Range: 0-1) Nitrite: positive   (Normal Range: Negative) Leukocyte Esterace: 1+   (Normal Range: Negative)    Comments: Rita Ohara  May 29, 2010 11:45 AM

## 2010-11-24 NOTE — Miscellaneous (Signed)
Summary: Waiver of Liability for Shingles Vaccine  Waiver of Liability for Shingles Vaccine   Imported By: Maryln Gottron 04/20/2010 13:07:21  _____________________________________________________________________  External Attachment:    Type:   Image     Comment:   External Document

## 2010-11-24 NOTE — Medication Information (Signed)
Summary: Reimbursement Assistance for [facility]  Reimbursement Assistance for Re[facility]ast   Imported By: Maryln Gottron 12/22/2009 14:47:02  _____________________________________________________________________  External Attachment:    Type:   Image     Comment:   External Document

## 2010-11-24 NOTE — Assessment & Plan Note (Signed)
Summary: SW PT URINARY SYM/BACK PAIN/PS   Vital Signs:  Patient profile:   77 year old female Temp:     99.0 degrees F oral  Vitals Entered By: Sid Falcon LPN (January 09, 2010 9:51 AM) CC: UTI symptoms, back discomfort   History of Present Illness: Patient seen as a work in with one-week history of urinary symptoms of frequency and some mild burning. Denies any nausea, vomiting, or fever. Has had some nonspecific lower lumbar back pains since urinary symptoms started. No history of antibiotic allergy. Drinking fluids well.  Allergies: 1)  ! Codeine Sulfate (Codeine Sulfate) 2)  ! Fosamax 3)  ! Boniva (Ibandronate Sodium)  Past History:  Past Medical History: Last updated: 12/12/2009 Diabetes mellitus, type II Hyperlipidemia Hypertension Depression Osteoporosis PMH reviewed for relevance  Review of Systems  The patient denies anorexia, fever, weight loss, hematuria, and incontinence.    Physical Exam  General:  Well-developed,well-nourished,in no acute distress; alert,appropriate and cooperative throughout examination Lungs:  Normal respiratory effort, chest expands symmetrically. Lungs are clear to auscultation, no crackles or wheezes. Heart:  normal rate and regular rhythm.   Msk:  no CVA tenderness.   Impression & Recommendations:  Problem # 1:  ACUTE CYSTITIS (ICD-595.0) urine cx and start antibiotics. Her updated medication list for this problem includes:    Ciprofloxacin Hcl 250 Mg Tabs (Ciprofloxacin hcl) ..... One by mouth two times a day for 7 days  Orders: UA Dipstick w/o Micro (manual) ([id number]) T-Culture, Urine ([id number])  Complete Medication List: 1)  Amlodipine Besylate 5 Mg Tabs (Amlodipine besylate) .... Take 1/2 tablet by mouth once a day 2)  Simvastatin 40 Mg Tabs (Simvastatin) .... Take 1 tablet by mouth at bedtime 3)  Metformin Hcl 850 Mg Tabs (Metformin hcl) .... Two times a day 4)  Caltrate 600+d 600-400 Mg-unit Tabs (Calcium  carbonate-vitamin d) .... Two times a day 5)  Aspirin Ec 325 Mg Tbec (Aspirin) .... Once daily 6)  Zolpidem Tartrate 10 Mg Tabs (Zolpidem tartrate) .... Take 1 tab by mouth at bedtime as needed or as directed 7)  Propranolol Hcl 10 Mg Tabs (Propranolol hcl) .... Take 1 tablet by mouth two times a day (take in the morning and 6 hours later) 8)  Paroxetine Hcl 40 Mg Tabs (Paroxetine hcl) .... Take 1 tablet by mouth once a day 9)  Ciprofloxacin Hcl 250 Mg Tabs (Ciprofloxacin hcl) .... One by mouth two times a day for 7 days  Patient Instructions: 1)  Drink plenty of fluids up to 3-4 quarts a day. Cranberry juice is especially recommended in addition to large amounts of water. Avoid caffeine & carbonated drinks, they tend to irritate the bladder, Return in 3-5 days if you're not better: sooner if you're feeling worse.  Prescriptions: CIPROFLOXACIN HCL 250 MG TABS (CIPROFLOXACIN HCL) one by mouth two times a day for 7 days  #14 x 0   Entered and Authorized by:   Evelena Peat MD   Signed by:   Evelena Peat MD on 01/09/2010   Method used:   Electronically to        [facility] [phone]* (retail)       [address]       [place], [place]  [address]       Ph: [phone] or [phone]       Fax: [phone]   RxID:   [id number]   Laboratory Results   Urine  Tests    Routine Urinalysis   Color: yellow Appearance: Hazy Glucose: negative   (Normal Range: Negative) Bilirubin: negative   (Normal Range: Negative) Ketone: negative   (Normal Range: Negative) Spec. Gravity: 1.025   (Normal Range: 1.003-1.035) Blood: large   (Normal Range: Negative) pH: 5.0   (Normal Range: 5.0-8.0) Protein: 30   (Normal Range: Negative) Urobilinogen: 0.2   (Normal Range: 0-1) Nitrite: negative   (Normal Range: Negative) Leukocyte Esterace: large   (Normal Range: Negative)    Comments: Sid Falcon LPN  January 09, 2010 10:00 AM

## 2010-11-26 NOTE — Assessment & Plan Note (Signed)
Summary: 6 month rov./njr   Vital Signs:  Patient profile:   77 year old female Weight:      151 pounds BMI:     27.72 Temp:     98.1 degrees F oral Pulse rate:   80 / minute Pulse rhythm:   regular Resp:     12 per minute BP sitting:   140 / 62  Vitals Entered By: Alfred Levins, CMA (October 09, 2010 9:36 AM) CC: rov Is Patient Diabetic? Yes Pain Assessment Patient in pain? no        CC:  rov.  History of Present Illness:  Follow-Up Visit      This is a 77 year old woman who presents for Follow-up visit.  The patient denies chest pain and palpitations.  Since the last visit the patient notes no new problems or concerns, except she has noticed some aches and pains related to arthritis sees Dr. Beverely Low.  The patient reports taking meds as prescribed and not monitoring BP.  When questioned about possible medication side effects, the patient notes none.    All other systems reviewed and were negative   Current Problems (verified): 1)  Osteoporosis  (ICD-733.00) 2)  Intention Tremor  (ICD-333.1) 3)  Insomnia-sleep Disorder-unspec  (ICD-780.52) 4)  Depression  (ICD-311) 5)  Anemia, Other Unspec  (ICD-285.9) 6)  Hypertension  (ICD-401.9) 7)  Hyperlipidemia  (ICD-272.4) 8)  Diabetes Mellitus, Type II  (ICD-250.00)  Current Medications (verified): 1)  Amlodipine Besylate 5 Mg  Tabs (Amlodipine Besylate) .... Take 1/2 Tablet By Mouth Once A Day 2)  Simvastatin 40 Mg Tabs (Simvastatin) .... Take 1 Tablet By Mouth At Bedtime 3)  Metformin Hcl 850 Mg Tabs (Metformin Hcl) .... Two Times A Day 4)  Caltrate 600+d 600-400 Mg-Unit  Tabs (Calcium Carbonate-Vitamin D) .... Two Times A Day 5)  Aspirin Ec 325 Mg  Tbec (Aspirin) .... Once Daily 6)  Zolpidem Tartrate 10 Mg  Tabs (Zolpidem Tartrate) .... Take 1 Tab By Mouth At Bedtime As Needed or As Directed 7)  Propranolol Hcl 10 Mg Tabs (Propranolol Hcl) .... Take 1 Tablet By Mouth Two Times A Day (Take in The Morning and 6 Hours  Later) 8)  Paroxetine Hcl 40 Mg Tabs (Paroxetine Hcl) .... Take 1 Tablet By Mouth Once A Day  Allergies (verified): 1)  ! Codeine Sulfate (Codeine Sulfate) 2)  ! Fosamax 3)  ! Boniva (Ibandronate Sodium)  Past History:  Past Medical History: Last updated: 12/12/2009 Diabetes mellitus, type II Hyperlipidemia Hypertension Depression Osteoporosis  Past Surgical History: Last updated: 05/15/2007 Caesarean section X1 Cataract extraction CT surgery  2007 L CTS  Family History: Last updated: 06/23/2007 Family History of Colon CA 1st degree relative mother Family History Lung cancer  father MI age 64 brother deceased with stomach CA---MI  Social History: Last updated: 06/23/2007 Single Never Smoked Regular exercise-no  Risk Factors: Exercise: no (06/23/2007)  Risk Factors: Smoking Status: never (04/17/2010)  Physical Exam  General:  pleasant, well-developed female in no acute distress. HEENT exam atraumatic, normocephalic symmetric her muscles are intact. Figure venous distention is absent. Chest clear to auscultation cardiac exam S1-S2 are regular. Extremities no edema. Neurologic exam she is alert with a normal gait.   Impression & Recommendations:  Problem # 1:  HYPERTENSION (ICD-401.9) fair control continue current medications  Her updated medication list for this problem includes:    Amlodipine Besylate 5 Mg Tabs (Amlodipine besylate) .Marland Kitchen... Take 1/2 tablet by mouth once a day  Propranolol Hcl 10 Mg Tabs (Propranolol hcl) .Marland Kitchen... Take 1 tablet by mouth two times a day (take in the morning and 6 hours later)  BP today: 140/62 Prior BP: 130/68 (04/17/2010)  Prior 10 Yr Risk Heart Disease: 17 % (06/23/2007)  Labs Reviewed: K+: 5.0 (10/02/2010) Creat: : 0.9 (10/02/2010)   Chol: 223 (10/02/2010)   HDL: 56.70 (10/02/2010)   LDL: 84 (04/10/2010)   TG: 161.0 (10/02/2010)  Problem # 2:  DIABETES MELLITUS, TYPE II (ICD-250.00) controlled continue current  medications  Her updated medication list for this problem includes:    Metformin Hcl 850 Mg Tabs (Metformin hcl) .Marland Kitchen..Marland Kitchen Two times a day    Aspirin Ec 325 Mg Tbec (Aspirin) ..... Once daily  Labs Reviewed: Creat: 0.9 (10/02/2010)     Last Eye Exam: normal (12/03/2009) Reviewed HgBA1c results: 7.1 (10/02/2010)  7.1 (04/10/2010)  Problem # 3:  HYPERLIPIDEMIA (ICD-272.4) controlled continue current medications  Her updated medication list for this problem includes:    Simvastatin 40 Mg Tabs (Simvastatin) .Marland Kitchen... Take 1 tablet by mouth at bedtime  Labs Reviewed: SGOT: 18 (10/02/2010)   SGPT: 13 (10/02/2010)  Lipid Goals: Chol Goal: 200 (06/23/2007)   HDL Goal: 40 (06/23/2007)   LDL Goal: 100 (06/23/2007)   TG Goal: 150 (06/23/2007)  Prior 10 Yr Risk Heart Disease: 17 % (06/23/2007)   HDL:56.70 (10/02/2010), 68.40 (04/10/2010)  LDL:84 (04/10/2010), 79 (12/05/2009)  Chol:223 (10/02/2010), 168 (04/10/2010)  Trig:161.0 (10/02/2010), 76.0 (04/10/2010)  Complete Medication List: 1)  Amlodipine Besylate 5 Mg Tabs (Amlodipine besylate) .... Take 1/2 tablet by mouth once a day 2)  Simvastatin 40 Mg Tabs (Simvastatin) .... Take 1 tablet by mouth at bedtime 3)  Metformin Hcl 850 Mg Tabs (Metformin hcl) .... Two times a day 4)  Caltrate 600+d 600-400 Mg-unit Tabs (Calcium carbonate-vitamin d) .... Two times a day 5)  Aspirin Ec 325 Mg Tbec (Aspirin) .... Once daily 6)  Zolpidem Tartrate 10 Mg Tabs (Zolpidem tartrate) .... Take 1 tab by mouth at bedtime as needed or as directed 7)  Propranolol Hcl 10 Mg Tabs (Propranolol hcl) .... Take 1 tablet by mouth two times a day (take in the morning and 6 hours later) 8)  Paroxetine Hcl 40 Mg Tabs (Paroxetine hcl) .... Take 1 tablet by mouth once a day  Patient Instructions: 1)  Please schedule a follow-up appointment in 6 months. 2)  labs one week prior to visit 3)  lipids---272.4 4)  lfts-995.2 5)  bmet-995.2 6)  A1C-250.02 7)       Orders  Added: 1)  Est. Patient Level IV [16109]     Preventive Care Screening  Mammogram:    Date:  09/24/2010    Next Due:  09/2012    Results:  normalpt's report

## 2011-01-15 ENCOUNTER — Encounter: Payer: Self-pay | Admitting: Internal Medicine

## 2011-02-17 ENCOUNTER — Other Ambulatory Visit: Payer: Self-pay | Admitting: Internal Medicine

## 2011-03-09 NOTE — H&P (Signed)
NAME:  Laura Salas, Laura Salas NO.:  [id number]   MEDICAL RECORD NO.:  [id number]          PATIENT TYPE:  INP   LOCATION:  1824                         FACILITY:  [facility]   PHYSICIAN:  Donalynn Furlong, MD      DATE OF BIRTH:  24-Apr-1931   DATE OF ADMISSION:  12/23/2007  DATE OF DISCHARGE:                              HISTORY & PHYSICAL   PRIMARY CARE PHYSICIAN:  Valetta Mole. Swords, MD   CHIEF COMPLAINT:  Near syncopal episode, acute renal insufficiency.   HISTORY OF PRESENT ILLNESS:  Ms. Kolander is a 77 year old Caucasian  female.  She was in the grocery store with her daughter-in-law today.  She went to the grocery store after taking lunch. She was feeling tired  at the time after finishing the shopping at the time of checking out.  At that time, she felt a little dizzy and she fell on the ground,  hitting her elbow on the floor with some bleeding.  She did not loose  consciousness.  She did not have seizures.  This was a witnessed episode  by her daughter-in-law.  She immediately stood up and she was completely  normal after that.  She did not have any dizzy episodes or any weakness  before or after the episode.  She denied any history of fainting or loss  of consciousness in the past.  She does have a history of diabetes  mellitus, hypertension, depression, hyperlipidemia, along with carotid  artery stenosis for which she gets ultrasound every three years.  She  denied any history of urinary tract infection, but she was told by  primary care that she has some bacteria in the urine in the past.  She  has not received any antibiotic for urinary tract infection recently  either.  She denied any history of renal insufficiency in the past.  She  denied any history of coronary artery disease, stroke in the past.  She  did not have any fever, chills, headache, abdominal pain, flank pain,  dysuria, burning pain, foul smell of urine in the recent past.   PAST MEDICAL HISTORY:  1.  Diabetes mellitus.  2. Hypertension.  3. Depression.  4. Hyperlipidemia.  5. Carotid artery stenosis.   PAST SURGICAL HISTORY:  1. Cesarean section.  2. Carpal tunnel release.  3. Left eye surgery.   ALLERGIES:  CODEINE.   MEDICATIONS AT HOME:  1. Lisinopril 40 mg daily.  2. Metformin 850 mg daily.  3. Paroxetine 20 mg daily.  4. Simvastatin 40 mg daily.   REVIEW OF SYSTEMS:  Positive as per HPI, the rest negative. Review of  systems done for 14 systems today.   SOCIAL HISTORY:  She recently moved into the [place] area.  She lives  with her son and daughter-in-law.  She denied any use of alcohol, drug  or tobacco use.   FAMILY HISTORY:  Noncontributory.   PHYSICAL EXAMINATION:  VITAL SIGNS:  Normal.  Blood pressure 111/68,  temperature 97.8, pulse 77, respirations 12, oxygen saturation 98% on  room air.  GENERAL:  Alert and oriented x3, no acute distress.  CARDIOVASCULAR:  S1, S2 regular.  No murmurs, rubs or gallops.  LUNGS:  Clear to auscultation bilaterally.  No wheezes or rhonchi.  NECK:  Supple, nontender, nondistended.  Bowel sounds present.  No flank  tenderness appreciated.  EXTREMITIES:  No cyanosis, clubbing or edema. Power is 5 in all four  extremities.  HEENT:  Normocephalic, atraumatic.  Eyes:  Pupils equal, round and  reactive to light and accommodation.  Extraocular movements intact.  Oral cavity: Oral mucosa moist.  No thrush noted.  NECK:  Supple, nontender, no elevated jugular venous pressure, no  thyromegaly.  The patient does have bilaterally enlarged submandibular  lymph nodes on both sides which are nontender.  NEUROLOGICAL:  Intact cranial nerves.  Motor strength is intact in all  four extremities.  Sensations are normal.  Reflexes are normal with  normal coordination, speech, no nystagmus and no facial drop.  SKIN:  No rash or bruits noted except laceration over the elbow.   LABORATORY DATA:  EKG shows sinus tachycardia at 104 beats per  minute,  but no acute ST-T wave changes.  Urinalysis shows turbid urine along  with trace hemoglobin, small amount of bilirubin, ketones 15, total  protein 30, nitrite negative, leukocyte esterase large, WBC too numerous  to count, RBC 0-2, bacteria many.  Sodium 137, potassium 5.6, chloride  107, BUN 45, glucose 166, bicarbonate 20.4, creatinine 2.4.  WBC 12.4,  hemoglobin 12.3, platelets 266.   ASSESSMENT/PLAN:  1. Near syncopal episodes.  2. Asymptomatic bacteriuria.  3. Hyperkalemia.  4. Acute renal insufficiency.  5. Hypertension, but no orthostatic hypertension.  6. Diabetes mellitus.  7. Depression.  8. Hyperlipidemia.   PLAN:  1. We will admit the patient to telemetry bed.  We will rule out MI      with cardiac enzymes and troponins x3.  We will check CBC, CMP,      magnesium phosphate, EKG, chest x-ray in the morning.  2. Will get neuro checks q.4 h for her dizziness.  3. Will check urine culture to rule out urinary tract infection.  The      patient has already received Bactrim and Rocephin in the ER.  She      does not need any more antibiotics at this time.  If her culture is      positive with bacteria in significant amount, will treat further.      Will start IV fluid normal saline at 100 cc per hour.  She has      already received IV fluids in the ER.  She also received tetanus      toxoid in the ER along with wound care and stitches for her      laceration.  4. We will check orthostatic blood pressure in the morning and record      on the chart.  5. Will give Kayexalate for hyperkalemia, one dose.  6. Will check CPK level also.  7. Will continue sliding scale insulin for coverage of diabetes and      will continue her home paroxetine.  8. Will hold her Metformin and antihypertensive medication at this      time due to renal insufficiency.  9. Further plan according to the lab and workup pending in the      morning.      Donalynn Furlong, MD  Electronically  Signed     TVP/MEDQ  D:  12/24/2007  T:  12/24/2007  Job:  [id number]   cc:  Valerie A. Felicity Coyer, MD  Valetta Mole Swords, MD

## 2011-03-12 NOTE — Discharge Summary (Signed)
NAME:  Laura Salas, Laura Salas               ACCOUNT NO.:  [id number]   MEDICAL RECORD NO.:  [id number]          PATIENT TYPE:  INP   LOCATION:  4714                         FACILITY:  [facility]   PHYSICIAN:  Raenette Rover. Felicity Coyer, MDDATE OF BIRTH:  Mar 09, 1931   DATE OF ADMISSION:  12/24/2007  DATE OF DISCHARGE:  12/26/2007                               DISCHARGE SUMMARY   DISCHARGE DIAGNOSES:  1. Status post near syncopal episode with orthostatic hypotension.  2. Acute renal insufficiency/dehydration/hyperkalemia on admission.  3. Diabetes type 2.  4. Hypertension.  5. Depression.  6. Hyperlipidemia.  7. Carotid artery stenosis with 40-60% bilateral ICA stenosis   HISTORY OF PRESENT ILLNESS:  Laura Salas is a 77 year old female  admitted on December 24, 2007, with chief complaint of near syncopal  episode.  She was in the grocery store with her daughter-in-law and was  feeling tired after finishing the shopping at which time she felt dizzy  and fell and on the ground, hitting her elbow on the floor.  She  apparently did not lose consciousness nor have a seizure at that time,  and this was a witnessed fall.  She was admitted for further evaluation  and treatment.   PAST MEDICAL HISTORY:  1. Diabetes type 2.  2. Hypertension.  3. Depression.  4. Hyperlipidemia.  5. Carotid artery Stenosis.   COURSE OF HOSPITALIZATION.:  1. NEAR SYNCOPAL EPISODE WITH ORTHOSTATIC HYPOTENSION.  The patient      was admitted and was noted to be quite orthostatic.  She was given      IV hydration and her symptoms improved.  She is also noted to have      acute renal insufficiency on admission with a creatinine of 2.4.      Her ACE inhibitor was held.  She was mildly hyperkalemic at that      time as well.  Her potassium normalized.  She also underwent a 2-D      echo performed during this admission which noted a normal left      ventricular ejection fraction.  It appeared that the patient's      primary issue with  dehydration on admission although there was no      clear indication for why she was dehydrated at that time.  She did      have carotid duplex performed during this admission which noted      bilateral ICA stenosis of 40-60% and aspirin was added.  This will      need continued outpatient monitoring.  2. E coli UTI.  The patient was placed on oral Cipro which was      continued at time of discharge.  3. Diabetes type 2.  The patient's metformin was held during this      admission due to acute renal insufficiency but was resumed at time      of discharge as her creatinine had normalized   PERTINENT LABORATORY DATA:  At time of discharge, hemoglobin 7.6,  hematocrit 31.7, BUN 16, creatinine 1.04, potassium 4.6.   MEDICATIONS:  At time of discharge.  1. Paxil  20 mg p.o. daily.  2. Cipro 250 mg p.o. b.i.d. times 7 days.  3. Simvastatin 40 mg p.o. daily.  4. Metformin 850 mg p.o. daily.  5. Aspirin 325 mg p.o. daily.   FOLLOW UP:  The patient was instructed to follow up with Dr. Smitty Cords  swords on March 11 at 1:45 p.m.      Sandford Craze, NP      Raenette Rover. Felicity Coyer, MD  Electronically Signed    MO/MEDQ  D:  01/11/2008  T:  01/11/2008  Job:  [id number]

## 2011-03-12 NOTE — Op Note (Signed)
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   PROCEDURES:  1. Excision of conjunctival tumor.  2. Lamellar keratectomy.  3. Cryotherapy.   ANESTHESIA:  MAC.   COMPLICATIONS:  None.   SPECIMENS:  Sent to pathology.   DESCRIPTION OF PROCEDURE:  The patient was brought to the operating room  after all the risks, benefits, and alternatives were fully explained to the  patient.  The patient was then prepped and draped in the usual sterile  fashion, taken care to drape eyelashes out of the operative field.  The  lesion was then marked with marking pen, and measured about 2.5 mm x 10 mm  vertically.  The lesion was marked with marking pen, then injected with  lidocaine and epinephrine.  The lesion was then dissected free from the  sclera.  No adhesions to the sclera was noted, and removed to the corneal  edge.  The borders were then marked with short Vicryl sutures and plain gut  sutures.  The patient then underwent a lamellar keratectomy of the cornea to  remove any corneal remnants of the tumor.  Cryotherapy was then applied to  the corneal surface in a double-freeze technique.  Cryotherapy was also  applied to the scleral bed, and also the peripheral conjunctiva of margins  in the typical double-freeze technique.  The conjunctival was then sutured  using multiple Vicryl sutures.  The corneal burr was also place on the  corneal surface to smooth out the lamellar edges of the keratectomy.  Antibiotic steroid  ointment was placed on the eye.  The patient was also  given a subconjunctival injection of Solu-Medrol.  There were no  complications.           ______________________________  Haskel Khan, M.D.     ST/MEDQ  D:  08/22/2006  T:  08/23/2006  Job:  [id number]

## 2011-03-12 NOTE — Op Note (Signed)
NAME:  Laura Salas, Laura Salas               ACCOUNT NO.:  [id number]   MEDICAL RECORD NO.:  [id number]          PATIENT TYPE:  AMB   LOCATION:  SDS                          FACILITY:  [facility]   PHYSICIAN:  Haskel Khan, M.D.DATE OF BIRTH:  09-03-1931   DATE OF PROCEDURE:  08/18/2006  DATE OF DISCHARGE:  08/18/2006                                 OPERATIVE REPORT   RE-DICTATION OF OP REPORT:  Please use this dictation and disregard the  prior one.   PREOPERATIVE DIAGNOSIS:  Conjunctival tumor with extension into the cornea.   POSTOPERATIVE DIAGNOSIS:  Conjunctival tumor with extension into the cornea.   ANESTHESIA:  MAC.   COMPLICATIONS:  None.   PROCEDURE:  1. Excision of conjunctival tumor of the left eye.  2. Lamellar keratectomy of the left eye and cryotherapy of the scleral      bed, left eye.   SPECIMENS:  Were sent to the pathology.   The patient was brought to the operating room after all risks, benefits and  alternatives were fully explained to the patient.  The patient has seen  anesthesia and her medical doctor and was cleared for MAC anesthesia.  The  patient was then prepped and draped in the usual sterile fashion, taking  care to drape the eyelashes out of the operative field.  The lesion was  marked with a marking pen.  It measured 2.5 millimeters horizontally and 10  millimeters vertically and was a rectangular-shaped lesion lying against the  limbus against the cornea.  There was a small extension of less than 0.3  millimeters of lesion on the cornea.  The lesion was marked with a marking  pen, leaving at least one millimeter of normal conjunctiva.  The lesion was  marked with the superior, inferior and lateral ends with at least a one-  millimeter margin of visually normal conjunctiva.  After the lesion was  marked, the lesion was then injected with lidocaine with epinephrine for  more anesthesia.  The lesion was dissected in one piece from superior to  inferior and was excised and sent to pathology.  As the lesion was  dissected, it was noted there were no adhesions between the conjunctiva and  the scleral surface.  The corneal edge of the lesion was also removed.  A  lamellar keratectomy was also performed using  0.124 secs and a Deaver  blade.  The lamellar edge of the cornea was dissected to a 10% thickness to  make sure any residual tumor was removed from the corneal surface.  Then a  corneal burr was then used to smooth out the corneal surface.  The lesion  was marked with multiple Vicryl and silk sutures, labeling the superior,  inferior, temporal and nasal edges and also the lateral edges of the lesion.  This was sent to pathology along with the specimen.  Cryotherapy was then  applied to the corneal surface and also to the scleral surface and the  conjunctival margin of the lesion that was dissected free.  The cryotherapy  was done in the typical double-freeze technique throughout the areas  described.  After adequate cryotherapy, the conjunctiva was then sutured  closed using multiple Vicryl sutures.  Care was taken to make sure the  scleral surface was fully covered.  Antibiotic steroid ointment was placed  in the eye and a subconjunctival injection of 20 mg of Solu-Medrol was given  to the patient in the inferior fornix.  The eye was then pressured-patched  and the patient was discharged to recovery room in stable condition and  there were no complications.   ADDENDUM:  Dr. Noel Gerold went down to pathology to meet with the pathologist on  the day after the operation performed to clarify all the edges of the  conjunctival tumor to the pathologist.           ______________________________  Haskel Khan, M.D.     ST/MEDQ  D:  08/30/2006  T:  08/31/2006  Job:  [id number]

## 2011-04-02 ENCOUNTER — Other Ambulatory Visit (INDEPENDENT_AMBULATORY_CARE_PROVIDER_SITE_OTHER): Payer: Medicare Other

## 2011-04-02 DIAGNOSIS — E785 Hyperlipidemia, unspecified: Secondary | ICD-10-CM

## 2011-04-02 DIAGNOSIS — IMO0001 Reserved for inherently not codable concepts without codable children: Secondary | ICD-10-CM

## 2011-04-02 DIAGNOSIS — T887XXA Unspecified adverse effect of drug or medicament, initial encounter: Secondary | ICD-10-CM

## 2011-04-02 LAB — HEPATIC FUNCTION PANEL
AST: 32 U/L (ref 0–37)
Albumin: 4.2 g/dL (ref 3.5–5.2)
Alkaline Phosphatase: 86 U/L (ref 39–117)
Bilirubin, Direct: 0.1 mg/dL (ref 0.0–0.3)
Total Protein: 7.1 g/dL (ref 6.0–8.3)

## 2011-04-02 LAB — HEMOGLOBIN A1C: Hgb A1c MFr Bld: 7.6 % — ABNORMAL HIGH (ref 4.6–6.5)

## 2011-04-02 LAB — LIPID PANEL: HDL: 70.4 mg/dL (ref >39.00)

## 2011-04-02 LAB — BASIC METABOLIC PANEL
CO2: 29 mEq/L (ref 19–32)
Glucose, Bld: 132 mg/dL — ABNORMAL HIGH (ref 70–99)
Potassium: 6.3 mEq/L (ref 3.5–5.1)
Sodium: 143 mEq/L (ref 135–145)

## 2011-04-05 ENCOUNTER — Other Ambulatory Visit (INDEPENDENT_AMBULATORY_CARE_PROVIDER_SITE_OTHER): Payer: Medicare Other

## 2011-04-05 DIAGNOSIS — E875 Hyperkalemia: Secondary | ICD-10-CM

## 2011-04-05 LAB — BASIC METABOLIC PANEL
CO2: 27 mEq/L (ref 19–32)
Calcium: 9.5 mg/dL (ref 8.4–10.5)
Chloride: 104 mEq/L (ref 96–112)
Glucose, Bld: 163 mg/dL — ABNORMAL HIGH (ref 70–99)
Potassium: 5 mEq/L (ref 3.5–5.1)
Sodium: 139 mEq/L (ref 135–145)

## 2011-04-06 ENCOUNTER — Encounter: Payer: Self-pay | Admitting: Internal Medicine

## 2011-04-09 ENCOUNTER — Encounter: Payer: Self-pay | Admitting: Internal Medicine

## 2011-04-09 ENCOUNTER — Ambulatory Visit (INDEPENDENT_AMBULATORY_CARE_PROVIDER_SITE_OTHER): Payer: Medicare Other | Admitting: Internal Medicine

## 2011-04-09 DIAGNOSIS — I10 Essential (primary) hypertension: Secondary | ICD-10-CM

## 2011-04-09 DIAGNOSIS — E785 Hyperlipidemia, unspecified: Secondary | ICD-10-CM

## 2011-04-09 DIAGNOSIS — E119 Type 2 diabetes mellitus without complications: Secondary | ICD-10-CM

## 2011-04-09 MED ORDER — ZOLPIDEM TARTRATE 10 MG PO TABS: 5.0000 mg | ORAL_TABLET | Freq: Every evening | ORAL | Status: DC | PRN

## 2011-04-09 NOTE — Progress Notes (Signed)
  Subjective:    Patient ID: Laura Salas, female    DOB: Mar 07, 1931, 77 y.o.   MRN: [id number]  HPI  Patient Active Problem List  Diagnoses  . DIABETES MELLITUS, TYPE II--tolerating meds  . HYPERLIPIDEMIA---no sxs on meds  .   Marland Kitchen DEPRESSION--tolerating meds  . INTENTION TREMOR----doing well on beta-blocker  . HYPERTENSION-tolerating meds  .   Marland Kitchen    Has had evaluation by dr Devonne Doughty for knee pain---no further evaluation at this time. Pain is mild and does not interrupt her ADLs  Past Medical History  Diagnosis Date  . Diabetes mellitus   . Hyperlipidemia   . Hypertension   . Osteoporosis   . Depression    Past Surgical History  Procedure Date  . Cesarean section   . Cataract extraction   . Carpal tunnel release     left    reports that she has never smoked. She has never used smokeless tobacco. She reports that she does not drink alcohol or use illicit drugs. family history includes Cancer in her brother and mother and Heart attack in her brother and father. Allergies  Allergen Reactions  . Alendronate Sodium     REACTION: upset stomach, joint and muscle pain  . Codeine Sulfate     REACTION: dizziness, "put her to bed"  . Ibandronate Sodium     REACTION: upset stomach, joint and muscle pain     Review of Systems  patient denies chest pain, shortness of breath, orthopnea. Denies lower extremity edema, abdominal pain, change in appetite, change in bowel movements. Patient denies rashes, musculoskeletal complaints. No other specific complaints in a complete review of systems.      Objective:   Physical Exam  Well-developed well-nourished female in no acute distress. HEENT exam atraumatic, normocephalic, extraocular muscles are intact. Neck is supple. No jugular venous distention no thyromegaly. Chest clear to auscultation without increased work of breathing. Cardiac exam S1 and S2 are regular. Abdominal exam active bowel sounds, soft, nontender. Extremities no edema.  Neurologic exam she is alert without any motor sensory deficits. Gait is normal.        Assessment & Plan:  Hyperkalemia---reviewed-normal and rechecked.

## 2011-04-09 NOTE — Assessment & Plan Note (Signed)
Repeat BP is better

## 2011-04-09 NOTE — Assessment & Plan Note (Signed)
Reasonable control Continue same meds 

## 2011-04-09 NOTE — Assessment & Plan Note (Signed)
Reasonable control No reason to change meds

## 2011-05-24 ENCOUNTER — Other Ambulatory Visit: Payer: Self-pay | Admitting: Internal Medicine

## 2011-06-10 ENCOUNTER — Encounter (INDEPENDENT_AMBULATORY_CARE_PROVIDER_SITE_OTHER): Payer: Medicare Other | Admitting: Ophthalmology

## 2011-06-10 DIAGNOSIS — H3530 Unspecified macular degeneration: Secondary | ICD-10-CM

## 2011-06-10 DIAGNOSIS — E11359 Type 2 diabetes mellitus with proliferative diabetic retinopathy without macular edema: Secondary | ICD-10-CM

## 2011-06-10 DIAGNOSIS — E11319 Type 2 diabetes mellitus with unspecified diabetic retinopathy without macular edema: Secondary | ICD-10-CM

## 2011-06-10 DIAGNOSIS — H43819 Vitreous degeneration, unspecified eye: Secondary | ICD-10-CM

## 2011-06-11 ENCOUNTER — Encounter (INDEPENDENT_AMBULATORY_CARE_PROVIDER_SITE_OTHER): Payer: Medicare Other | Admitting: Ophthalmology

## 2011-07-16 LAB — I-STAT 8, (EC8 V) (CONVERTED LAB)
BUN: 45 — ABNORMAL HIGH
Bicarbonate: 24.2 — ABNORMAL HIGH
Chloride: 107
Glucose, Bld: 166 — ABNORMAL HIGH
HCT: 40
Hemoglobin: 13.6
Operator id: 151321
Potassium: 5.6 — ABNORMAL HIGH
Sodium: 137

## 2011-07-16 LAB — DIFFERENTIAL
Basophils Absolute: 0
Basophils Relative: 0
Eosinophils Relative: 0
Lymphocytes Relative: 14
Monocytes Absolute: 0.7
Monocytes Relative: 6
Neutro Abs: 9.8 — ABNORMAL HIGH

## 2011-07-16 LAB — CBC
HCT: 36.6
Hemoglobin: 12.3
MCHC: 33.6
MCV: 88.6
RDW: 13.8

## 2011-07-16 LAB — URINE CULTURE: Colony Count: >=100000

## 2011-07-16 LAB — URINALYSIS, ROUTINE W REFLEX MICROSCOPIC
Ketones, ur: 15 — AB
Nitrite: NEGATIVE
Specific Gravity, Urine: 1.023
pH: 5

## 2011-07-16 LAB — URINE MICROSCOPIC-ADD ON

## 2011-07-19 LAB — COMPREHENSIVE METABOLIC PANEL
ALT: 15
Alkaline Phosphatase: 86
BUN: 37 — ABNORMAL HIGH
CO2: 22
Calcium: 9.3
GFR calc non Af Amer: 28 — ABNORMAL LOW
Glucose, Bld: 132 — ABNORMAL HIGH
Sodium: 136

## 2011-07-19 LAB — URINE MICROSCOPIC-ADD ON

## 2011-07-19 LAB — DIFFERENTIAL
Basophils Relative: 1
Eosinophils Absolute: 0.1
Lymphs Abs: 2.3
Neutro Abs: 7.9 — ABNORMAL HIGH
Neutrophils Relative %: 70

## 2011-07-19 LAB — BASIC METABOLIC PANEL
Calcium: 8.7
GFR calc Af Amer: >60
GFR calc non Af Amer: 52 — ABNORMAL LOW
Sodium: 139

## 2011-07-19 LAB — URINALYSIS, ROUTINE W REFLEX MICROSCOPIC
Glucose, UA: NEGATIVE
Hgb urine dipstick: NEGATIVE
Specific Gravity, Urine: 1.017

## 2011-07-19 LAB — CBC
Hemoglobin: 10.6 — ABNORMAL LOW
MCHC: 33.6
MCV: 88.9
RBC: 3.57 — ABNORMAL LOW
RBC: 3.83 — ABNORMAL LOW
RDW: 13.5
RDW: 14.1
WBC: 8.5

## 2011-07-19 LAB — CK TOTAL AND CKMB (NOT AT ARMC)
CK, MB: 1.1
Relative Index: INVALID
Relative Index: INVALID
Total CK: 34
Total CK: 40

## 2011-07-19 LAB — PHOSPHORUS: Phosphorus: 3.9

## 2011-07-19 LAB — MAGNESIUM: Magnesium: 2

## 2011-07-19 LAB — TROPONIN I: Troponin I: 0.01

## 2011-08-06 ENCOUNTER — Ambulatory Visit (INDEPENDENT_AMBULATORY_CARE_PROVIDER_SITE_OTHER): Payer: Medicare Other | Admitting: Internal Medicine

## 2011-08-06 ENCOUNTER — Encounter: Payer: Self-pay | Admitting: Internal Medicine

## 2011-08-06 ENCOUNTER — Other Ambulatory Visit (INDEPENDENT_AMBULATORY_CARE_PROVIDER_SITE_OTHER): Payer: Medicare Other | Admitting: Internal Medicine

## 2011-08-06 VITALS — BP 140/80 | HR 72 | Temp 98.1°F | Resp 16 | Ht 65.0 in | Wt 146.0 lb

## 2011-08-06 VITALS — BP 140/80 | HR 76 | Temp 98.0°F | Resp 16 | Ht 65.0 in | Wt 146.0 lb

## 2011-08-06 DIAGNOSIS — N390 Urinary tract infection, site not specified: Secondary | ICD-10-CM

## 2011-08-06 DIAGNOSIS — E119 Type 2 diabetes mellitus without complications: Secondary | ICD-10-CM

## 2011-08-06 DIAGNOSIS — R350 Frequency of micturition: Secondary | ICD-10-CM

## 2011-08-06 DIAGNOSIS — N3000 Acute cystitis without hematuria: Secondary | ICD-10-CM

## 2011-08-06 DIAGNOSIS — Z23 Encounter for immunization: Secondary | ICD-10-CM

## 2011-08-06 LAB — POCT URINALYSIS DIPSTICK
Bilirubin, UA: NEGATIVE
Nitrite, UA: POSITIVE
Protein, UA: NEGATIVE
Urobilinogen, UA: 0.2
pH, UA: 5.5

## 2011-08-06 LAB — BASIC METABOLIC PANEL
GFR: 59.69 mL/min — ABNORMAL LOW (ref >60.00)
Glucose, Bld: 147 mg/dL — ABNORMAL HIGH (ref 70–99)
Potassium: 5 mEq/L (ref 3.5–5.1)
Sodium: 140 mEq/L (ref 135–145)

## 2011-08-06 LAB — LIPID PANEL
HDL: 78.2 mg/dL (ref >39.00)
Total CHOL/HDL Ratio: 2
Triglycerides: 106 mg/dL (ref 0.0–149.0)

## 2011-08-06 LAB — HEPATIC FUNCTION PANEL
ALT: 18 U/L (ref 0–35)
AST: 21 U/L (ref 0–37)
Albumin: 4.2 g/dL (ref 3.5–5.2)

## 2011-08-06 MED ORDER — CIPROFLOXACIN HCL 500 MG PO TABS: 500.0000 mg | ORAL_TABLET | Freq: Two times a day (BID) | ORAL | Status: AC

## 2011-08-06 NOTE — Progress Notes (Signed)
  Subjective:    Laura Salas is a 77 y.o. female who complains of burning with urination, nocturia, suprapubic pressure and urgency. She has had symptoms for 2 weeks. Patient also complains of back pain. Patient denies fever. Patient does not have a history of recurrent UTI. Patient does not have a history of pyelonephritis.   The following portions of the patient's history were reviewed and updated as appropriate: allergies, current medications, past family history, past medical history, past social history, past surgical history and problem list.  Review of Systems Pertinent items are noted in HPI.    Objective:    BP 140/80  Pulse 76  Temp 98 F (36.7 C)  Resp 16  Ht 5\' 5"  (1.651 m)  Wt 146 lb (66.225 kg)  BMI 24.30 kg/m2  General Appearance:    Alert, cooperative, no distress, appears stated age  Head:    Normocephalic, without obvious abnormality, atraumatic  Eyes:    PERRL, conjunctiva/corneas clear, EOM's intact, fundi    benign, both eyes  Ears:    Normal TM's and external ear canals, both ears  Nose:   Nares normal, septum midline, mucosa normal, no drainage    or sinus tenderness  Throat:   Lips, mucosa, and tongue normal; teeth and gums normal  Neck:   Supple, symmetrical, trachea midline, no adenopathy;    thyroid:  no enlargement/tenderness/nodules; no carotid   bruit or JVD  Back:     Symmetric, no curvature, ROM normal, no CVA tenderness  Lungs:     Clear to auscultation bilaterally, respirations unlabored  Chest Wall:    No tenderness or deformity   Heart:    Regular rate and rhythm, S1 and S2 normal, no murmur, rub   or gallop  Breast Exam:    No tenderness, masses, or nipple abnormality  Abdomen:     Soft, non-tender, bowel sounds active all four quadrants,    no masses, no organomegaly  Genitalia:    Normal female without lesion, discharge or tenderness  Rectal:    Normal tone, normal prostate, no masses or tenderness;   guaiac negative stool    Extremities:   Extremities normal, atraumatic, no cyanosis or edema  Pulses:   2+ and symmetric all extremities  Skin:   Skin color, texture, turgor normal, no rashes or lesions  Lymph nodes:   Cervical, supraclavicular, and axillary nodes normal  Neurologic:   CNII-XII intact, normal strength, sensation and reflexes    throughout    Laboratory:  Urine dipstick: 1+ for leukocyte esterase and 1+ for nitrites.   Micro exam: not done.    Assessment:    Acute cystitis     Plan:    Medications: ciprofloxacin. Maintain adequate hydration. Follow up if symptoms not improving, and as needed.

## 2011-08-06 NOTE — Progress Notes (Signed)
Addended by: Rita Ohara R on: 08/06/2011 09:57 AM   Modules accepted: Orders

## 2011-08-06 NOTE — Patient Instructions (Signed)
Patient was instructed to continue all medications as prescribed. To stop at the checkout desk and schedule a followup appointment  

## 2011-08-09 NOTE — Progress Notes (Signed)
Quick Note:  Call patient, labs ok Can cancel upcoming appointment See me 4 months with  Bmet, lfts, a1c, lipids ______

## 2011-08-13 ENCOUNTER — Ambulatory Visit: Payer: Medicare Other | Admitting: Internal Medicine

## 2011-08-13 ENCOUNTER — Ambulatory Visit (INDEPENDENT_AMBULATORY_CARE_PROVIDER_SITE_OTHER): Payer: Medicare Other | Admitting: Internal Medicine

## 2011-08-13 DIAGNOSIS — Z23 Encounter for immunization: Secondary | ICD-10-CM

## 2011-09-27 ENCOUNTER — Other Ambulatory Visit: Payer: Self-pay | Admitting: Internal Medicine

## 2011-09-27 DIAGNOSIS — E875 Hyperkalemia: Secondary | ICD-10-CM

## 2011-10-08 ENCOUNTER — Ambulatory Visit (INDEPENDENT_AMBULATORY_CARE_PROVIDER_SITE_OTHER): Payer: Medicare Other | Admitting: Ophthalmology

## 2011-10-08 DIAGNOSIS — E11359 Type 2 diabetes mellitus with proliferative diabetic retinopathy without macular edema: Secondary | ICD-10-CM

## 2011-10-08 DIAGNOSIS — E1139 Type 2 diabetes mellitus with other diabetic ophthalmic complication: Secondary | ICD-10-CM

## 2011-10-08 DIAGNOSIS — H3530 Unspecified macular degeneration: Secondary | ICD-10-CM

## 2011-10-08 DIAGNOSIS — H43819 Vitreous degeneration, unspecified eye: Secondary | ICD-10-CM

## 2011-11-11 ENCOUNTER — Other Ambulatory Visit: Payer: Self-pay | Admitting: Internal Medicine

## 2011-12-03 ENCOUNTER — Other Ambulatory Visit (INDEPENDENT_AMBULATORY_CARE_PROVIDER_SITE_OTHER): Payer: Medicare Other

## 2011-12-03 DIAGNOSIS — E785 Hyperlipidemia, unspecified: Secondary | ICD-10-CM

## 2011-12-03 DIAGNOSIS — IMO0001 Reserved for inherently not codable concepts without codable children: Secondary | ICD-10-CM

## 2011-12-03 LAB — BASIC METABOLIC PANEL
Chloride: 104 mEq/L (ref 96–112)
Creatinine, Ser: 1 mg/dL (ref 0.4–1.2)
Potassium: 6 mEq/L — ABNORMAL HIGH (ref 3.5–5.1)

## 2011-12-03 LAB — LIPID PANEL
Cholesterol: 174 mg/dL (ref 0–200)
LDL Cholesterol: 78 mg/dL (ref 0–99)
VLDL: 24.6 mg/dL (ref 0.0–40.0)

## 2011-12-03 LAB — HEPATIC FUNCTION PANEL
ALT: 25 U/L (ref 0–35)
Bilirubin, Direct: 0.1 mg/dL (ref 0.0–0.3)
Total Bilirubin: 0.7 mg/dL (ref 0.3–1.2)

## 2011-12-09 ENCOUNTER — Other Ambulatory Visit (INDEPENDENT_AMBULATORY_CARE_PROVIDER_SITE_OTHER): Payer: Medicare Other

## 2011-12-09 DIAGNOSIS — E875 Hyperkalemia: Secondary | ICD-10-CM

## 2011-12-09 LAB — BASIC METABOLIC PANEL
BUN: 26 mg/dL — ABNORMAL HIGH (ref 6–23)
Calcium: 9.5 mg/dL (ref 8.4–10.5)
Creatinine, Ser: 1.2 mg/dL (ref 0.4–1.2)
GFR: 44.8 mL/min — ABNORMAL LOW (ref >60.00)
Potassium: 5.2 mEq/L — ABNORMAL HIGH (ref 3.5–5.1)

## 2011-12-10 ENCOUNTER — Ambulatory Visit (INDEPENDENT_AMBULATORY_CARE_PROVIDER_SITE_OTHER): Payer: Medicare Other | Admitting: Internal Medicine

## 2011-12-10 ENCOUNTER — Encounter: Payer: Self-pay | Admitting: Internal Medicine

## 2011-12-10 VITALS — BP 137/76 | Temp 98.2°F | Wt 150.0 lb

## 2011-12-10 DIAGNOSIS — I798 Other disorders of arteries, arterioles and capillaries in diseases classified elsewhere: Secondary | ICD-10-CM

## 2011-12-10 DIAGNOSIS — E1159 Type 2 diabetes mellitus with other circulatory complications: Secondary | ICD-10-CM

## 2011-12-10 MED ORDER — MELOXICAM 7.5 MG PO TABS: 7.5000 mg | ORAL_TABLET | Freq: Every day | ORAL | Status: DC

## 2011-12-10 NOTE — Progress Notes (Signed)
Quick Note:  Left a message for pt to return call. ______ 

## 2011-12-10 NOTE — Progress Notes (Signed)
Pt informed and was here this am and was given results

## 2011-12-10 NOTE — Progress Notes (Signed)
Patient ID: Laura Salas, female   DOB: [birth year], 78 y.o.   MRN: [id number] Has intermittent leg pain---seeing dr. Ranell Patrick. She is requesting a pain medication other than tylenol  htn---tolerating meds without difficulty...home BPs 120s/60s  Lipids---tolerating meds  DM2---takes metformin  Past Medical History  Diagnosis Date  . Diabetes mellitus   . Hyperlipidemia   . Hypertension   . Osteoporosis   . Depression     History   Social History  . Marital Status: Widowed    Spouse Name: N/A    Number of Children: N/A  . Years of Education: N/A   Occupational History  . Not on file.   Social History Main Topics  . Smoking status: Never Smoker   . Smokeless tobacco: Never Used  . Alcohol Use: No  . Drug Use: No  . Sexually Active:    Other Topics Concern  . Not on file   Social History Narrative  . No narrative on file    Past Surgical History  Procedure Date  . Cesarean section   . Cataract extraction   . Carpal tunnel release     left    Family History  Problem Relation Age of Onset  . Cancer Mother     colon  . Heart attack Father   . Heart attack Brother   . Cancer Brother     stomach    Allergies  Allergen Reactions  . Alendronate Sodium     REACTION: upset stomach, joint and muscle pain  . Codeine Sulfate     REACTION: dizziness, "put her to bed"  . Ibandronate Sodium     REACTION: upset stomach, joint and muscle pain    Current Outpatient Prescriptions on File Prior to Visit  Medication Sig Dispense Refill  . amLODipine (NORVASC) 5 MG tablet TAKE 1 TABLET ONCE DAILY  30 tablet  2  . aspirin 325 MG tablet Take 325 mg by mouth daily.        . Calcium Carbonate-Vit D-Min (CALTRATE 600+D PLUS) 600-400 MG-UNIT per tablet Take 1 tablet by mouth daily.        . metFORMIN (GLUCOPHAGE) 850 MG tablet take 1 tablet by mouth twice a day  60 tablet  4  . PARoxetine (PAXIL) 40 MG tablet Take 40 mg by mouth every morning.        . propranolol  (INDERAL) 10 MG tablet TAKE 1 TABLET BY MOUTH TWICE A DAY (TAKE IN THE MORNING AND 6 HOURS LATER)  60 tablet  2  . simvastatin (ZOCOR) 40 MG tablet TAKE 1 TABLET BY MOUTH AT BEDTIME  90 tablet  2  . zolpidem (AMBIEN) 10 MG tablet take 1/2 tablet by mouth at bedtime if needed for sleep  30 tablet  1     patient denies chest pain, shortness of breath, orthopnea. Denies lower extremity edema, abdominal pain, change in appetite, change in bowel movements. Patient denies rashes, musculoskeletal complaints. No other specific complaints in a complete review of systems.   BP 160/80  Temp(Src) 98.2 F (36.8 C) (Oral)  Wt 150 lb (68.04 kg)  Well-developed well-nourished female in no acute distress. HEENT exam atraumatic, normocephalic, extraocular muscles are intact. Neck is supple. No jugular venous distention no thyromegaly. Chest clear to auscultation without increased work of breathing. Cardiac exam S1 and S2 are regular. Abdominal exam active bowel sounds, soft, nontender. Extremities no edema.

## 2012-01-24 ENCOUNTER — Other Ambulatory Visit: Payer: Self-pay | Admitting: Internal Medicine

## 2012-01-25 ENCOUNTER — Telehealth: Payer: Self-pay | Admitting: Internal Medicine

## 2012-01-25 NOTE — Telephone Encounter (Signed)
Pt req refill of zolpidem (AMBIEN) 10 MG tablet to [facility] on [address]. Pt req this med yesterday from pharmacy and have not gotten a response back.

## 2012-01-25 NOTE — Telephone Encounter (Signed)
rx called into pharmacy

## 2012-02-24 ENCOUNTER — Other Ambulatory Visit: Payer: Self-pay | Admitting: Internal Medicine

## 2012-05-24 ENCOUNTER — Other Ambulatory Visit: Payer: Self-pay | Admitting: Internal Medicine

## 2012-06-02 ENCOUNTER — Other Ambulatory Visit (INDEPENDENT_AMBULATORY_CARE_PROVIDER_SITE_OTHER): Payer: Medicare Other

## 2012-06-02 DIAGNOSIS — E1159 Type 2 diabetes mellitus with other circulatory complications: Secondary | ICD-10-CM

## 2012-06-02 DIAGNOSIS — I798 Other disorders of arteries, arterioles and capillaries in diseases classified elsewhere: Secondary | ICD-10-CM

## 2012-06-02 LAB — HEPATIC FUNCTION PANEL
ALT: 17 U/L (ref 0–35)
AST: 19 U/L (ref 0–37)
Albumin: 4.2 g/dL (ref 3.5–5.2)

## 2012-06-02 LAB — LIPID PANEL
HDL: 71.1 mg/dL (ref >39.00)
Total CHOL/HDL Ratio: 2
Triglycerides: 106 mg/dL (ref 0.0–149.0)
VLDL: 21.2 mg/dL (ref 0.0–40.0)

## 2012-06-02 LAB — BASIC METABOLIC PANEL
CO2: 28 mEq/L (ref 19–32)
Chloride: 104 mEq/L (ref 96–112)
GFR: 55.54 mL/min — ABNORMAL LOW (ref >60.00)
Glucose, Bld: 122 mg/dL — ABNORMAL HIGH (ref 70–99)
Potassium: 5 mEq/L (ref 3.5–5.1)
Sodium: 140 mEq/L (ref 135–145)

## 2012-06-09 ENCOUNTER — Ambulatory Visit (INDEPENDENT_AMBULATORY_CARE_PROVIDER_SITE_OTHER): Payer: Medicare Other | Admitting: Internal Medicine

## 2012-06-09 ENCOUNTER — Encounter: Payer: Self-pay | Admitting: Internal Medicine

## 2012-06-09 VITALS — BP 172/82 | HR 76 | Temp 98.2°F | Wt 148.0 lb

## 2012-06-09 DIAGNOSIS — E785 Hyperlipidemia, unspecified: Secondary | ICD-10-CM

## 2012-06-09 DIAGNOSIS — E119 Type 2 diabetes mellitus without complications: Secondary | ICD-10-CM

## 2012-06-09 DIAGNOSIS — I10 Essential (primary) hypertension: Secondary | ICD-10-CM

## 2012-06-09 NOTE — Assessment & Plan Note (Signed)
She will monitor at home-- goal bp < 135/85 She will call if bp higher than that.  Previous bps have been bettter controlled

## 2012-06-09 NOTE — Assessment & Plan Note (Signed)
Not as well controlled.

## 2012-06-09 NOTE — Assessment & Plan Note (Signed)
Lab Results  Component Value Date   HGBA1C 6.8* 06/02/2012   Well controlled Continue same meds

## 2012-06-09 NOTE — Progress Notes (Signed)
Patient ID: Laura Salas, female   DOB: 1931-09-26, 78 y.o.   MRN: [id number]   patient comes in for followup of multiple medical problems including type 2 diabetes, hyperlipidemia, hypertension. The patient does not check blood pressure at home. The patetient does not follow an exercise or diet program. The patient denies any polyuria, polydipsia.  In the past the patient has gone to diabetic treatment center. The patient is tolerating medications  Without difficulty. The patient does admit to medication compliance.   CBGs daily at home 90-110 (fasting)  Past Medical History  Diagnosis Date  . Diabetes mellitus   . Hyperlipidemia   . Hypertension   . Osteoporosis   . Depression     History   Social History  . Marital Status: Widowed    Spouse Name: N/A    Number of Children: N/A  . Years of Education: N/A   Occupational History  . Not on file.   Social History Main Topics  . Smoking status: Never Smoker   . Smokeless tobacco: Never Used  . Alcohol Use: No  . Drug Use: No  . Sexually Active:    Other Topics Concern  . Not on file   Social History Narrative  . No narrative on file    Past Surgical History  Procedure Date  . Cesarean section   . Cataract extraction   . Carpal tunnel release     left    Family History  Problem Relation Age of Onset  . Cancer Mother     colon  . Heart attack Father   . Heart attack Brother   . Cancer Brother     stomach    Allergies  Allergen Reactions  . Alendronate Sodium     REACTION: upset stomach, joint and muscle pain  . Codeine Sulfate     REACTION: dizziness, "put her to bed"  . Ibandronate Sodium     REACTION: upset stomach, joint and muscle pain    Current Outpatient Prescriptions on File Prior to Visit  Medication Sig Dispense Refill  . amLODipine (NORVASC) 5 MG tablet take 1 tablet by mouth once daily  30 tablet  2  . aspirin 325 MG tablet Take 325 mg by mouth daily.        . Calcium Carbonate-Vit D-Min  (CALTRATE 600+D PLUS) 600-400 MG-UNIT per tablet Take 1 tablet by mouth daily.        . metFORMIN (GLUCOPHAGE) 850 MG tablet take 1 tablet by mouth twice a day  60 tablet  4  . PARoxetine (PAXIL) 40 MG tablet Take 40 mg by mouth every morning.        . propranolol (INDERAL) 10 MG tablet TAKE 1 TABLET BY MOUTH TWICE A DAY (TAKE IN THE MORNING AND 6 HOURS LATER)  60 tablet  2  . simvastatin (ZOCOR) 40 MG tablet take 1 tablet by mouth at bedtime  90 tablet  2  . zolpidem (AMBIEN) 10 MG tablet take 1 tablet by mouth at bedtime if needed  30 tablet  3     patient denies chest pain, shortness of breath, orthopnea. Denies lower extremity edema, abdominal pain, change in appetite, change in bowel movements. Patient denies rashes, musculoskeletal complaints. No other specific complaints in a complete review of systems.   BP 172/82  Pulse 76  Temp 98.2 F (36.8 C) (Oral)  Wt 148 lb (67.132 kg)  Well-developed well-nourished female in no acute distress. HEENT exam atraumatic, normocephalic, extraocular muscles are intact.  Neck is supple. No jugular venous distention no thyromegaly. Chest clear to auscultation without increased work of breathing. Cardiac exam S1 and S2 are regular. Abdominal exam active bowel sounds, soft, nontender. Extremities no edema. Neurologic exam she is alert without any motor sensory deficits. Gait is normal.

## 2012-07-14 ENCOUNTER — Ambulatory Visit (INDEPENDENT_AMBULATORY_CARE_PROVIDER_SITE_OTHER): Payer: Medicare Other | Admitting: Ophthalmology

## 2012-07-28 ENCOUNTER — Ambulatory Visit (INDEPENDENT_AMBULATORY_CARE_PROVIDER_SITE_OTHER): Payer: Medicare Other | Admitting: Ophthalmology

## 2012-07-28 DIAGNOSIS — E11359 Type 2 diabetes mellitus with proliferative diabetic retinopathy without macular edema: Secondary | ICD-10-CM

## 2012-07-28 DIAGNOSIS — H43819 Vitreous degeneration, unspecified eye: Secondary | ICD-10-CM

## 2012-07-28 DIAGNOSIS — H26499 Other secondary cataract, unspecified eye: Secondary | ICD-10-CM

## 2012-07-28 DIAGNOSIS — H3530 Unspecified macular degeneration: Secondary | ICD-10-CM

## 2012-07-28 DIAGNOSIS — E1139 Type 2 diabetes mellitus with other diabetic ophthalmic complication: Secondary | ICD-10-CM

## 2012-08-03 ENCOUNTER — Other Ambulatory Visit: Payer: Self-pay | Admitting: Internal Medicine

## 2012-08-04 ENCOUNTER — Ambulatory Visit (INDEPENDENT_AMBULATORY_CARE_PROVIDER_SITE_OTHER): Payer: Medicare Other | Admitting: Ophthalmology

## 2012-08-04 DIAGNOSIS — H2700 Aphakia, unspecified eye: Secondary | ICD-10-CM

## 2012-08-11 ENCOUNTER — Ambulatory Visit (INDEPENDENT_AMBULATORY_CARE_PROVIDER_SITE_OTHER): Payer: Medicare Other

## 2012-08-11 DIAGNOSIS — Z23 Encounter for immunization: Secondary | ICD-10-CM

## 2012-08-22 ENCOUNTER — Other Ambulatory Visit: Payer: Self-pay | Admitting: Internal Medicine

## 2012-09-18 ENCOUNTER — Other Ambulatory Visit: Payer: Self-pay | Admitting: Internal Medicine

## 2012-12-08 ENCOUNTER — Other Ambulatory Visit: Payer: Medicare Other

## 2012-12-11 ENCOUNTER — Other Ambulatory Visit (INDEPENDENT_AMBULATORY_CARE_PROVIDER_SITE_OTHER): Payer: Medicare Other

## 2012-12-11 DIAGNOSIS — E119 Type 2 diabetes mellitus without complications: Secondary | ICD-10-CM

## 2012-12-11 LAB — HEPATIC FUNCTION PANEL
ALT: 17 U/L (ref 0–35)
AST: 19 U/L (ref 0–37)
Alkaline Phosphatase: 84 U/L (ref 39–117)
Bilirubin, Direct: 0.1 mg/dL (ref 0.0–0.3)
Total Bilirubin: 0.8 mg/dL (ref 0.3–1.2)

## 2012-12-11 LAB — BASIC METABOLIC PANEL
BUN: 21 mg/dL (ref 6–23)
Chloride: 106 mEq/L (ref 96–112)
GFR: 64.92 mL/min (ref >60.00)
Potassium: 5 mEq/L (ref 3.5–5.1)
Sodium: 141 mEq/L (ref 135–145)

## 2012-12-11 LAB — LIPID PANEL
Cholesterol: 158 mg/dL (ref 0–200)
LDL Cholesterol: 74 mg/dL (ref 0–99)
Total CHOL/HDL Ratio: 3
VLDL: 21.4 mg/dL (ref 0.0–40.0)

## 2012-12-11 LAB — HEMOGLOBIN A1C: Hgb A1c MFr Bld: 7.3 % — ABNORMAL HIGH (ref 4.6–6.5)

## 2012-12-11 LAB — MICROALBUMIN / CREATININE URINE RATIO
Microalb Creat Ratio: 2.8 mg/g (ref 0.0–30.0)
Microalb, Ur: 3.2 mg/dL — ABNORMAL HIGH (ref 0.0–1.9)

## 2012-12-15 ENCOUNTER — Telehealth: Payer: Self-pay | Admitting: Internal Medicine

## 2012-12-15 ENCOUNTER — Ambulatory Visit: Payer: Medicare Other | Admitting: Internal Medicine

## 2012-12-15 MED ORDER — METFORMIN HCL 850 MG PO TABS: ORAL_TABLET | ORAL | Status: DC

## 2012-12-15 NOTE — Telephone Encounter (Signed)
Pt needs new script for metFORMIN (GLUCOPHAGE) 850 MG tablet. Instructions states "take 1 tablet by mouth twice a day" But it was only disp for 30 tablets. (need 60 for that to work!) [employer]

## 2012-12-15 NOTE — Telephone Encounter (Signed)
rx sent in electronically 

## 2012-12-29 ENCOUNTER — Ambulatory Visit: Payer: Medicare Other | Admitting: Internal Medicine

## 2013-01-03 ENCOUNTER — Ambulatory Visit (INDEPENDENT_AMBULATORY_CARE_PROVIDER_SITE_OTHER): Payer: Medicare Other | Admitting: Internal Medicine

## 2013-01-03 ENCOUNTER — Encounter: Payer: Self-pay | Admitting: Internal Medicine

## 2013-01-03 VITALS — BP 124/70 | HR 88 | Temp 98.6°F | Wt 146.0 lb

## 2013-01-03 DIAGNOSIS — I10 Essential (primary) hypertension: Secondary | ICD-10-CM

## 2013-01-03 DIAGNOSIS — E785 Hyperlipidemia, unspecified: Secondary | ICD-10-CM

## 2013-01-03 DIAGNOSIS — E119 Type 2 diabetes mellitus without complications: Secondary | ICD-10-CM

## 2013-01-03 NOTE — Progress Notes (Signed)
Patient ID: Laura Salas, female   DOB: 16-Jun-1931, 79 y.o.   MRN: [id number]   patient comes in for followup of multiple medical problems including type 2 diabetes, hyperlipidemia, hypertension. The patient does not check blood sugar. BPs at home 110s.70s. The patetient does not follow an exercise or diet program. The patient denies any polyuria, polydipsia.  In the past the patient has gone to diabetic treatment center. The patient is tolerating medications  Without difficulty. The patient does admit to medication compliance.   Past Medical History  Diagnosis Date  . Diabetes mellitus   . Hyperlipidemia   . Hypertension   . Osteoporosis   . Depression     History   Social History  . Marital Status: Widowed    Spouse Name: N/A    Number of Children: N/A  . Years of Education: N/A   Occupational History  . Not on file.   Social History Main Topics  . Smoking status: Never Smoker   . Smokeless tobacco: Never Used  . Alcohol Use: No  . Drug Use: No  . Sexually Active:    Other Topics Concern  . Not on file   Social History Narrative  . No narrative on file    Past Surgical History  Procedure Laterality Date  . Cesarean section    . Cataract extraction    . Carpal tunnel release      left    Family History  Problem Relation Age of Onset  . Cancer Mother     colon  . Heart attack Father   . Heart attack Brother   . Cancer Brother     stomach    Allergies  Allergen Reactions  . Alendronate Sodium     REACTION: upset stomach, joint and muscle pain  . Codeine Sulfate     REACTION: dizziness, "put her to bed"  . Ibandronate Sodium     REACTION: upset stomach, joint and muscle pain    Current Outpatient Prescriptions on File Prior to Visit  Medication Sig Dispense Refill  . amLODipine (NORVASC) 5 MG tablet take 1 tablet by mouth once daily  30 tablet  5  . aspirin 325 MG tablet Take 325 mg by mouth daily.        . Calcium Carbonate-Vit D-Min (CALTRATE  600+D PLUS) 600-400 MG-UNIT per tablet Take 1 tablet by mouth daily.        . metFORMIN (GLUCOPHAGE) 850 MG tablet take 1 tablet by mouth twice a day  60 tablet  4  . propranolol (INDERAL) 10 MG tablet TAKE 1 TABLET BY MOUTH TWICE A DAY (TAKE IN THE MORNING AND 6 HOURS LATER)  60 tablet  2  . simvastatin (ZOCOR) 40 MG tablet take 1 tablet by mouth at bedtime  90 tablet  2  . zolpidem (AMBIEN) 10 MG tablet take 1 tablet by mouth at bedtime if needed  30 tablet  3   No current facility-administered medications on file prior to visit.     patient denies chest pain, shortness of breath, orthopnea. Denies lower extremity edema, abdominal pain, change in appetite, change in bowel movements. Patient denies rashes, musculoskeletal complaints. No other specific complaints in a complete review of systems.   BP 152/74  Pulse 88  Temp(Src) 98.6 F (37 C) (Oral)  Wt 146 lb (66.225 kg)  BMI 24.3 kg/m2  Well-developed well-nourished female in no acute distress. HEENT exam atraumatic, normocephalic, extraocular muscles are intact. Neck is supple. No jugular venous  distention no thyromegaly. Chest clear to auscultation without increased work of breathing. Cardiac exam S1 and S2 are regular. Abdominal exam active bowel sounds, soft, nontender. Extremities no edema. Neurologic exam she is alert without any motor sensory deficits. Gait is normal.

## 2013-01-04 NOTE — Assessment & Plan Note (Signed)
BP Readings from Last 3 Encounters:  01/03/13 124/70  06/09/12 172/82  12/10/11 137/76   Well controlled. Continue current medications.

## 2013-01-04 NOTE — Assessment & Plan Note (Signed)
She needs laboratories today. We'll check blood work. For the time being continue current medications.

## 2013-01-04 NOTE — Assessment & Plan Note (Signed)
We will check laboratory work today. 

## 2013-01-18 ENCOUNTER — Other Ambulatory Visit: Payer: Self-pay | Admitting: Internal Medicine

## 2013-02-19 ENCOUNTER — Telehealth: Payer: Self-pay | Admitting: Internal Medicine

## 2013-02-19 NOTE — Telephone Encounter (Signed)
Prior auth submitted to [facility]. Waiting on response. Encounter closed.

## 2013-02-19 NOTE — Telephone Encounter (Signed)
Patient Information:  Caller Name: [name]  Phone: [phone]  Patient: , Laura Salas  Gender: Female  DOB: September 07, 1931  Age: 79 Years  PCP: Birdie Sons (Adults only)  Office Follow Up:  Does the office need to follow up with this patient?: Yes  Instructions For The Office: OFFICE PLEASE REVIEW REGARDING AMBIEN PRE AUTHORIZATION AND CALL PHARMACY- pt was given a refill in March 2014 with 3 refills but insurance will not approve this without pre auth from physician  RN Note:  Instructed pt that message will be sent to MD and for her to check back at pharmacy later this afternoon; Please call back if she has any difficulties with the refill  Symptoms  Reason For Call & Symptoms: Pt is calling and states that she takes Ambien and called on 02/15/13 for a refill;  pharmacy instructed that insurance refused to honor the refill because it needs pre approved; pharmacy called office for approval but has not heard anything back; pt feels that this should have been handled since this was called last Thursday;  pt would like to know when office will approve this so pharmacy can fill this; denies any sx atthistime; just needing a refill;  Reviewed Health History In EMR: Yes  Reviewed Medications In EMR: Yes  Reviewed Allergies In EMR: Yes  Reviewed Surgeries / Procedures: Yes  Date of Onset of Symptoms: Unknown  Guideline(s) Used:  No Protocol Available - Information Only  Disposition Per Guideline:   Discuss with PCP and Callback by Nurse Today  Reason For Disposition Reached:   Nursing judgment  Advice Given:  N/A  Patient Will Follow Care Advice:  YES

## 2013-03-05 ENCOUNTER — Encounter: Payer: Self-pay | Admitting: Gastroenterology

## 2013-05-14 ENCOUNTER — Other Ambulatory Visit: Payer: Self-pay | Admitting: Internal Medicine

## 2013-06-07 ENCOUNTER — Other Ambulatory Visit: Payer: Self-pay | Admitting: Internal Medicine

## 2013-06-22 ENCOUNTER — Telehealth: Payer: Self-pay | Admitting: Internal Medicine

## 2013-06-22 ENCOUNTER — Ambulatory Visit (INDEPENDENT_AMBULATORY_CARE_PROVIDER_SITE_OTHER): Payer: Medicare Other | Admitting: Family

## 2013-06-22 ENCOUNTER — Telehealth: Payer: Self-pay

## 2013-06-22 ENCOUNTER — Other Ambulatory Visit: Payer: Self-pay | Admitting: Family

## 2013-06-22 ENCOUNTER — Encounter: Payer: Self-pay | Admitting: Family

## 2013-06-22 VITALS — BP 116/60 | HR 112 | Wt 147.0 lb

## 2013-06-22 DIAGNOSIS — R635 Abnormal weight gain: Secondary | ICD-10-CM

## 2013-06-22 DIAGNOSIS — R410 Disorientation, unspecified: Secondary | ICD-10-CM

## 2013-06-22 DIAGNOSIS — F29 Unspecified psychosis not due to a substance or known physiological condition: Secondary | ICD-10-CM

## 2013-06-22 LAB — POCT URINALYSIS DIPSTICK: pH, UA: 5.5

## 2013-06-22 LAB — CBC WITH DIFFERENTIAL/PLATELET
Basophils Absolute: 0 10*3/uL (ref 0.0–0.1)
Basophils Relative: 0.1 % (ref 0.0–3.0)
HCT: 33.9 % — ABNORMAL LOW (ref 36.0–46.0)
Hemoglobin: 11.5 g/dL — ABNORMAL LOW (ref 12.0–15.0)
Lymphs Abs: 0.9 10*3/uL (ref 0.7–4.0)
MCHC: 33.8 g/dL (ref 30.0–36.0)
Monocytes Relative: 7.1 % (ref 3.0–12.0)
Neutro Abs: 17.8 10*3/uL — ABNORMAL HIGH (ref 1.4–7.7)
RBC: 3.8 Mil/uL — ABNORMAL LOW (ref 3.87–5.11)
RDW: 13.2 % (ref 11.5–14.6)

## 2013-06-22 LAB — BASIC METABOLIC PANEL
BUN: 18 mg/dL (ref 6–23)
CO2: 24 mEq/L (ref 19–32)
Calcium: 9.2 mg/dL (ref 8.4–10.5)
Creatinine, Ser: 1.1 mg/dL (ref 0.4–1.2)

## 2013-06-22 MED ORDER — SULFAMETHOXAZOLE-TRIMETHOPRIM 800-160 MG PO TABS: 1.0000 | ORAL_TABLET | Freq: Two times a day (BID) | ORAL | Status: DC

## 2013-06-22 NOTE — Progress Notes (Signed)
Subjective:    Patient ID: Laura Salas, female    DOB: 1931-03-22, 79 y.o.   MRN: [id number]  HPI 79 year old white female, nonsmoker patient of Dr. Lovell Sheehan is in today with an episode of confusion that occurred last night. Her son reports that she called him into her room and told him the room was a mass and needed to be clean. Meanwhile, she was laying in the floor. Unsure of she fallen. However, no physical injury. Reports a fall 3-4 weeks prior when she got her foot caught in the comforter on the bed. She is now alert and oriented. Denies any lightheadedness, dizziness, shortness of breath, chest pain, palpitations or edema. Son reports that she has been dehydrated in the past that caused similar symptoms. Her eating habits have decreased over the last several days. He has been encouraging her to drink more water.  Patient has a history of type 2 diabetes. Blood glucose this morning 151.  Review of Systems  Constitutional: Negative.   HENT: Negative.   Respiratory: Negative.   Cardiovascular: Negative.   Gastrointestinal: Negative.   Endocrine: Negative.   Genitourinary: Negative.   Musculoskeletal: Negative.   Skin: Negative.   Allergic/Immunologic: Negative.   Neurological: Negative.  Negative for speech difficulty, weakness and light-headedness.  Hematological: Negative.   Psychiatric/Behavioral: Positive for confusion.   Past Medical History  Diagnosis Date  . Diabetes mellitus   . Hyperlipidemia   . Hypertension   . Osteoporosis   . Depression     History   Social History  . Marital Status: Widowed    Spouse Name: N/A    Number of Children: N/A  . Years of Education: N/A   Occupational History  . Not on file.   Social History Main Topics  . Smoking status: Never Smoker   . Smokeless tobacco: Never Used  . Alcohol Use: No  . Drug Use: No  . Sexual Activity:    Other Topics Concern  . Not on file   Social History Narrative  . No narrative on file     Past Surgical History  Procedure Laterality Date  . Cesarean section    . Cataract extraction    . Carpal tunnel release      left    Family History  Problem Relation Age of Onset  . Cancer Mother     colon  . Heart attack Father   . Heart attack Brother   . Cancer Brother     stomach    Allergies  Allergen Reactions  . Alendronate Sodium     REACTION: upset stomach, joint and muscle pain  . Codeine Sulfate     REACTION: dizziness, "put her to bed"  . Ibandronate Sodium     REACTION: upset stomach, joint and muscle pain    Current Outpatient Prescriptions on File Prior to Visit  Medication Sig Dispense Refill  . amLODipine (NORVASC) 5 MG tablet TAKE 1 TABLET BY MOUTH ONCE DAILY  30 tablet  5  . aspirin 325 MG tablet Take 325 mg by mouth daily.        . Calcium Carbonate-Vit D-Min (CALTRATE 600+D PLUS) 600-400 MG-UNIT per tablet Take 1 tablet by mouth daily.        . metFORMIN (GLUCOPHAGE) 850 MG tablet take 1 tablet by mouth twice a day  60 tablet  4  . propranolol (INDERAL) 10 MG tablet TAKE 1 TABLET BY MOUTH TWICE A DAY (TAKE IN THE MORNING AND 6 HOURS LATER)  60 tablet  2  . simvastatin (ZOCOR) 40 MG tablet take 1 tablet by mouth at bedtime  90 tablet  2  . zolpidem (AMBIEN) 10 MG tablet TAKE 1 TABLET BY MOUTH AT BEDTIME AS NEEDED  30 tablet  3   No current facility-administered medications on file prior to visit.    BP 116/60  Pulse 112  Wt 147 lb (66.679 kg)  BMI 24.46 kg/m2chart    Objective:   Physical Exam  Constitutional: She is oriented to person, place, and time. She appears well-developed and well-nourished.  HENT:  Right Ear: External ear normal.  Left Ear: External ear normal.  Nose: Nose normal.  Mouth/Throat: Oropharynx is clear and moist.  Neck: Normal range of motion. Neck supple.  Cardiovascular: Normal rate, regular rhythm and normal heart sounds.   Pulmonary/Chest: Effort normal and breath sounds normal.  Abdominal: Soft. Bowel  sounds are normal.  Musculoskeletal: Normal range of motion.  Neurological: She is alert and oriented to person, place, and time. She has normal reflexes.  Skin: Skin is warm and dry.  Psychiatric: She has a normal mood and affect.          Assessment & Plan:  Assessment: 1. Confusion 2. Hypertension 3. Type 2 diabetes  Plan: Labs sent to include BMP, CBC, TSH, urin poc, will notify patient of results. If symptoms worsen, report to the emergency department for further evaluation. Increase intake of water. Encouraged her to drink a couple glasses of water when she gets home today. Patient agrees.

## 2013-06-22 NOTE — Telephone Encounter (Signed)
Patient Information:  Caller Name: Chanetta Marshall  Phone: [phone]  Patient: Laura Salas, Laura Salas  Gender: Female  DOB: [birth year]  Age: 79 Years  PCP: Birdie Sons (Adults only)  Office Follow Up:  Does the office need to follow up with this patient?: Yes  Instructions For The Office: DR. Cato Mulligan booked full.  Patient to be seen now.   PLEASE CONTACT SON AT  [phone]   Symptoms  Reason For Call & Symptoms: Son states twice in last three weeks she has fallen. She lives with her son. The last fall was last night, disoriented and unsure how she "got there".  Appeared confused at first and then became oriented.  He did not check her GBS at the time. This morniing #157.   Generalized weakness this morning.  son states not eating as of late. Awake oriented.  Reviewed Health History In EMR: Yes  Reviewed Medications In EMR: Yes  Reviewed Allergies In EMR: Yes  Reviewed Surgeries / Procedures: Yes  Date of Onset of Symptoms: 06/21/2013  Guideline(s) Used:  Weakness (Generalized) and Fatigue  Disposition Per Guideline:   Go to Office Now  Reason For Disposition Reached:   Moderate weakness (i.e., interferes with work, school, normal activities) and cause unknown  Advice Given:  Call Back If:  Unable to stand or walk  Passes out  Breathing difficulty occurs  You become worse.  Patient Will Follow Care Advice:  YES

## 2013-06-22 NOTE — Telephone Encounter (Signed)
Per Dr Cato Mulligan pt can see [name], appt scheduled

## 2013-06-22 NOTE — Patient Instructions (Signed)
Confusion °Confusion is the inability to think with your usual speed or clarity. Confusion may come on quickly or slowly over time. How quickly the confusion comes on depends on the cause. Confusion can be due to any number of causes. °CAUSES  °· Concussion, head injury, or head trauma. °· Seizures. °· Stroke. °· Fever. °· Senility. °· Heightened emotional states like rage or terror. °· Mental illness in which the person loses the ability to determine what is real and what is not (hallucinations). °· Infections. °· Toxic effects from alcohol, drugs, or prescription medicines. °· Dehydration and an imbalance of salts in the body (electrolytes). °· Lack of sleep. °· Low blood sugar (diabetes). °· Low levels of oxygen (for example from chronic lung disorders). °· Drug interactions or other medication side effects. °· Nutritional deficiencies, especially niacin, thiamine, vitamin C, or vitamin B. °· Sudden drop in body temperature (hypothermia). °· Illness in the elderly. Constipation can result in confusion. An elderly person who is hospitalized may become confused due to change in daily routine. °SYMPTOMS  °People often describe their thinking as cloudy or unclear when they are confused. Confusion can also include feeling disoriented. That means you are unaware of where or who you are. You may also not know what the date or time is. If confused, you may also have difficulty paying attention, remembering and making decisions. Some people also act aggressively when they are confused.  °DIAGNOSIS  °The medical evaluation of confusion may include: °· Blood and urine tests. °· X-rays. °· Brain and nervous system tests. °· Analyzing your brain waves (electroencphalogram or EEG). °· A special X-ray (MRI) of your head or other special studies. °Your physician will ask questions such as: °· Do you get days and nights mixed up? °· Are you awake during regular sleep times? °· Do you have trouble recognizing people? °· Do you  know where you are? °· Do you know the date and time? °· Does the confusion come and go? °· Is the confusion quickly getting worse? °· Has there been a recent illness? °· Has there been a recent head injury? °· Are you diabetic? °· Do you have a lung disorder? °· What medication are you taking? °· Have you taken drugs or alcohol? °TREATMENT  °An admission to the hospital may not be needed, but a confused person should not be left alone. Stay with a family member or friend until the confusion clears. Avoid alcohol, pain relievers or sedative drugs until you have fully recovered. Do not drive until your caregiver says it is okay. °HOME CARE INSTRUCTIONS °What family and friends can do: °· To find out if someone is confused ask him or her their name, age, and the date. If the person is unsure or answers incorrectly, he or she is confused. °· Always introduce yourself, no matter how well the person knows you. °· Often remind the person of his or her location. °· Place a calendar and clock near the confused person. °· Talk about current events and plans for the day. °· Try to keep the environment calm, quiet and peaceful. °· Make sure the patient keeps follow up appointments with their physician. °PREVENTION  °Ways to prevent confusion: °· Avoid alcohol. °· Eat a balanced diet. °· Get enough sleep. °· Do not become isolated. Spend time with other people and make plans for your days. °· Keep careful watch on your blood sugar levels if you are diabetic. °SEEK IMMEDIATE MEDICAL CARE IF:  °· You develop   severe headaches, repeated vomiting, seizures, blackouts or slurred speech. °· There is increasing confusion, weakness, numbness, restlessness or personality changes. °· You develop a loss of balance, have marked dizziness, feel uncoordinated or fall. °· You have delusions, hallucinations or develop severe anxiety. °· Your family members think you need to be rechecked. °Document Released: 11/18/2004 Document Revised:  01/03/2012 Document Reviewed: 07/16/2008 °[employer]® Patient Information ©2014 [employer]. ° °

## 2013-06-22 NOTE — Telephone Encounter (Signed)
Laura Salas from [facility] called to report: WBC is 20.2 and the lab will fax the orders over after smear.

## 2013-06-24 ENCOUNTER — Encounter (HOSPITAL_COMMUNITY): Payer: Self-pay

## 2013-06-24 ENCOUNTER — Inpatient Hospital Stay (HOSPITAL_COMMUNITY)
Admission: EM | Admit: 2013-06-24 | Discharge: 2013-06-25 | DRG: 690 | Disposition: A | Discharge status: Home Health | Payer: Medicare Other | Attending: Internal Medicine | Admitting: Internal Medicine

## 2013-06-24 DIAGNOSIS — E119 Type 2 diabetes mellitus without complications: Secondary | ICD-10-CM

## 2013-06-24 DIAGNOSIS — R531 Weakness: Secondary | ICD-10-CM

## 2013-06-24 DIAGNOSIS — E11319 Type 2 diabetes mellitus with unspecified diabetic retinopathy without macular edema: Secondary | ICD-10-CM | POA: Diagnosis present

## 2013-06-24 DIAGNOSIS — Z8659 Personal history of other mental and behavioral disorders: Secondary | ICD-10-CM | POA: Diagnosis present

## 2013-06-24 DIAGNOSIS — E1169 Type 2 diabetes mellitus with other specified complication: Secondary | ICD-10-CM | POA: Diagnosis present

## 2013-06-24 DIAGNOSIS — F29 Unspecified psychosis not due to a substance or known physiological condition: Secondary | ICD-10-CM

## 2013-06-24 DIAGNOSIS — E1159 Type 2 diabetes mellitus with other circulatory complications: Secondary | ICD-10-CM | POA: Diagnosis present

## 2013-06-24 DIAGNOSIS — R410 Disorientation, unspecified: Secondary | ICD-10-CM

## 2013-06-24 DIAGNOSIS — G250 Essential tremor: Secondary | ICD-10-CM

## 2013-06-24 DIAGNOSIS — G4700 Insomnia, unspecified: Secondary | ICD-10-CM | POA: Diagnosis present

## 2013-06-24 DIAGNOSIS — D72829 Elevated white blood cell count, unspecified: Secondary | ICD-10-CM | POA: Diagnosis present

## 2013-06-24 DIAGNOSIS — F3289 Other specified depressive episodes: Secondary | ICD-10-CM | POA: Diagnosis present

## 2013-06-24 DIAGNOSIS — I152 Hypertension secondary to endocrine disorders: Secondary | ICD-10-CM | POA: Diagnosis present

## 2013-06-24 DIAGNOSIS — I10 Essential (primary) hypertension: Secondary | ICD-10-CM | POA: Diagnosis present

## 2013-06-24 DIAGNOSIS — E860 Dehydration: Secondary | ICD-10-CM | POA: Diagnosis present

## 2013-06-24 DIAGNOSIS — D649 Anemia, unspecified: Secondary | ICD-10-CM

## 2013-06-24 DIAGNOSIS — N179 Acute kidney failure, unspecified: Secondary | ICD-10-CM | POA: Diagnosis present

## 2013-06-24 DIAGNOSIS — Z7982 Long term (current) use of aspirin: Secondary | ICD-10-CM

## 2013-06-24 DIAGNOSIS — R5381 Other malaise: Secondary | ICD-10-CM

## 2013-06-24 DIAGNOSIS — Z79899 Other long term (current) drug therapy: Secondary | ICD-10-CM

## 2013-06-24 DIAGNOSIS — E785 Hyperlipidemia, unspecified: Secondary | ICD-10-CM | POA: Diagnosis present

## 2013-06-24 DIAGNOSIS — N12 Tubulo-interstitial nephritis, not specified as acute or chronic: Secondary | ICD-10-CM

## 2013-06-24 DIAGNOSIS — F329 Major depressive disorder, single episode, unspecified: Secondary | ICD-10-CM

## 2013-06-24 DIAGNOSIS — N390 Urinary tract infection, site not specified: Secondary | ICD-10-CM

## 2013-06-24 DIAGNOSIS — R404 Transient alteration of awareness: Secondary | ICD-10-CM | POA: Diagnosis present

## 2013-06-24 DIAGNOSIS — M810 Age-related osteoporosis without current pathological fracture: Secondary | ICD-10-CM

## 2013-06-24 LAB — URINE MICROSCOPIC-ADD ON

## 2013-06-24 LAB — CG4 I-STAT (LACTIC ACID): Lactic Acid, Venous: 1.79 mmol/L (ref 0.5–2.2)

## 2013-06-24 LAB — CBC WITH DIFFERENTIAL/PLATELET
Basophils Absolute: 0 10*3/uL (ref 0.0–0.1)
Eosinophils Absolute: 0 10*3/uL (ref 0.0–0.7)
Eosinophils Relative: 0 % (ref 0–5)
Lymphs Abs: 0.7 10*3/uL (ref 0.7–4.0)
MCH: 30.1 pg (ref 26.0–34.0)
MCV: 86.5 fL (ref 78.0–100.0)
Platelets: 252 10*3/uL (ref 150–400)
RDW: 13.4 % (ref 11.5–15.5)

## 2013-06-24 LAB — BASIC METABOLIC PANEL
CO2: 24 mEq/L (ref 19–32)
Calcium: 9.4 mg/dL (ref 8.4–10.5)
Creatinine, Ser: 1.16 mg/dL — ABNORMAL HIGH (ref 0.50–1.10)
Glucose, Bld: 220 mg/dL — ABNORMAL HIGH (ref 70–99)

## 2013-06-24 LAB — HEPATIC FUNCTION PANEL
Albumin: 3.2 g/dL — ABNORMAL LOW (ref 3.5–5.2)
Alkaline Phosphatase: 95 U/L (ref 39–117)
Total Protein: 7.5 g/dL (ref 6.0–8.3)

## 2013-06-24 LAB — URINALYSIS, ROUTINE W REFLEX MICROSCOPIC
Bilirubin Urine: NEGATIVE
Glucose, UA: NEGATIVE mg/dL
Ketones, ur: 15 mg/dL — AB
Protein, ur: 100 mg/dL — AB

## 2013-06-24 LAB — GLUCOSE, CAPILLARY
Glucose-Capillary: 175 mg/dL — ABNORMAL HIGH (ref 70–99)
Glucose-Capillary: 105 mg/dL — ABNORMAL HIGH (ref 70–99)

## 2013-06-24 MED ORDER — SIMVASTATIN 40 MG PO TABS
40.0000 mg | ORAL_TABLET | Freq: Every evening | ORAL | Status: DC
  Administered 2013-06-24: 40 mg via ORAL
  Filled 2013-06-24 (×2): qty 1

## 2013-06-24 MED ORDER — ZOLPIDEM TARTRATE 5 MG PO TABS
5.0000 mg | ORAL_TABLET | Freq: Every evening | ORAL | Status: DC | PRN
  Administered 2013-06-24: 5 mg via ORAL
  Filled 2013-06-24: qty 1

## 2013-06-24 MED ORDER — ACETAMINOPHEN 500 MG PO TABS
1000.0000 mg | ORAL_TABLET | Freq: Once | ORAL | Status: AC
Start: 2013-06-24 — End: 2013-06-24
  Administered 2013-06-24: 1000 mg via ORAL
  Filled 2013-06-24: qty 2

## 2013-06-24 MED ORDER — INSULIN ASPART 100 UNIT/ML ~~LOC~~ SOLN
0.0000 [IU] | Freq: Three times a day (TID) | SUBCUTANEOUS | Status: DC
  Administered 2013-06-24 (×2): 2 [IU] via SUBCUTANEOUS

## 2013-06-24 MED ORDER — DOCUSATE SODIUM 100 MG PO CAPS
100.0000 mg | ORAL_CAPSULE | Freq: Two times a day (BID) | ORAL | Status: DC
  Administered 2013-06-24 – 2013-06-25 (×3): 100 mg via ORAL
  Filled 2013-06-24 (×4): qty 1

## 2013-06-24 MED ORDER — ENOXAPARIN SODIUM 40 MG/0.4ML ~~LOC~~ SOLN
40.0000 mg | SUBCUTANEOUS | Status: DC
  Administered 2013-06-24: 40 mg via SUBCUTANEOUS
  Filled 2013-06-24 (×2): qty 0.4

## 2013-06-24 MED ORDER — ONDANSETRON HCL 4 MG PO TABS: 4.0000 mg | ORAL_TABLET | Freq: Four times a day (QID) | ORAL | Status: DC | PRN

## 2013-06-24 MED ORDER — ONDANSETRON HCL 4 MG/2ML IJ SOLN: 4.0000 mg | Freq: Four times a day (QID) | INTRAMUSCULAR | Status: DC | PRN

## 2013-06-24 MED ORDER — ASPIRIN EC 325 MG PO TBEC
325.0000 mg | DELAYED_RELEASE_TABLET | Freq: Every day | ORAL | Status: DC
  Administered 2013-06-24 – 2013-06-25 (×2): 325 mg via ORAL
  Filled 2013-06-24 (×2): qty 1

## 2013-06-24 MED ORDER — ZOLPIDEM TARTRATE 5 MG PO TABS: 10.0000 mg | ORAL_TABLET | Freq: Every evening | ORAL | Status: DC | PRN

## 2013-06-24 MED ORDER — SODIUM CHLORIDE 0.9 % IV BOLUS (SEPSIS)
1000.0000 mL | Freq: Once | INTRAVENOUS | Status: AC
  Administered 2013-06-24: 1000 mL via INTRAVENOUS

## 2013-06-24 MED ORDER — DEXTROSE 5 % IV SOLN
1.0000 g | Freq: Once | INTRAVENOUS | Status: AC
  Administered 2013-06-24: 1 g via INTRAVENOUS
  Filled 2013-06-24: qty 10

## 2013-06-24 MED ORDER — ACETAMINOPHEN 650 MG RE SUPP: 650.0000 mg | Freq: Four times a day (QID) | RECTAL | Status: DC | PRN

## 2013-06-24 MED ORDER — ACETAMINOPHEN 325 MG PO TABS: 650.0000 mg | ORAL_TABLET | Freq: Four times a day (QID) | ORAL | Status: DC | PRN

## 2013-06-24 MED ORDER — AMLODIPINE BESYLATE 5 MG PO TABS
5.0000 mg | ORAL_TABLET | Freq: Every day | ORAL | Status: DC
  Administered 2013-06-24 – 2013-06-25 (×2): 5 mg via ORAL
  Filled 2013-06-24 (×2): qty 1

## 2013-06-24 MED ORDER — SODIUM CHLORIDE 0.9 % IV SOLN
INTRAVENOUS | Status: AC
  Administered 2013-06-24: 13:00:00 via INTRAVENOUS

## 2013-06-24 MED ORDER — LEVOFLOXACIN IN D5W 750 MG/150ML IV SOLN
750.0000 mg | INTRAVENOUS | Status: DC
  Administered 2013-06-24: 750 mg via INTRAVENOUS
  Filled 2013-06-24: qty 150

## 2013-06-24 MED ORDER — ASPIRIN 325 MG PO TABS: 325.0000 mg | ORAL_TABLET | Freq: Every day | ORAL | Status: DC

## 2013-06-24 NOTE — ED Provider Notes (Signed)
CSN: [id number]     Arrival date & time 06/24/13  4098 History   First MD Initiated Contact with Patient 06/24/13 0825     Chief Complaint  Patient presents with  . Emesis  . Weakness   (Consider location/radiation/quality/duration/timing/severity/associated sxs/prior Treatment) HPI Comments: 79 year old female with 3-4 days of generalized weakness. She is also had intermittent confusion that resolved a couple days ago. She's had a couple falls were she's not sure how she fell. Her son took her to the doctor 2 days ago and they found her to have an elevated white count and a UTI. She was started on an unknown medication. She's taken about 4 pills per something she is up to them. She's had subjective fevers at home during this time. As her weakness has worsened they decided to bring her back in to the ER. The son denies any obvious trauma besides finding her on the floor. The patient denies any dysuria but does say she's been having some urinary frequency. He denies any shortness of breath or cough. No chest pain.  The history is provided by the patient and a relative.    Past Medical History  Diagnosis Date  . Diabetes mellitus   . Hyperlipidemia   . Hypertension   . Osteoporosis   . Depression    Past Surgical History  Procedure Laterality Date  . Cesarean section    . Cataract extraction    . Carpal tunnel release      left   Family History  Problem Relation Age of Onset  . Cancer Mother     colon  . Heart attack Father   . Heart attack Brother   . Cancer Brother     stomach   History  Substance Use Topics  . Smoking status: Never Smoker   . Smokeless tobacco: Never Used  . Alcohol Use: No   OB History   Grav Para Term Preterm Abortions TAB SAB Ect Mult Living                 Review of Systems  Respiratory: Negative for cough, shortness of breath and wheezing.   Cardiovascular: Negative for chest pain.  Gastrointestinal: Positive for vomiting. Negative for  nausea, abdominal pain and diarrhea.  Genitourinary: Positive for frequency. Negative for dysuria.  Musculoskeletal: Negative for back pain.  Psychiatric/Behavioral: Positive for confusion (intermittent).  All other systems reviewed and are negative.    Allergies  Alendronate sodium; Codeine sulfate; and Ibandronate sodium  Home Medications   Current Outpatient Rx  Name  Route  Sig  Dispense  Refill  . amLODipine (NORVASC) 5 MG tablet      TAKE 1 TABLET BY MOUTH ONCE DAILY   30 tablet   5     Refill Approved   . aspirin 325 MG tablet   Oral   Take 325 mg by mouth daily.           . Calcium Carbonate-Vit D-Min (CALTRATE 600+D PLUS) 600-400 MG-UNIT per tablet   Oral   Take 1 tablet by mouth daily.           . metFORMIN (GLUCOPHAGE) 850 MG tablet      take 1 tablet by mouth twice a day   60 tablet   4   . propranolol (INDERAL) 10 MG tablet      TAKE 1 TABLET BY MOUTH TWICE A DAY (TAKE IN THE MORNING AND 6 HOURS LATER)   60 tablet   2   .  simvastatin (ZOCOR) 40 MG tablet      take 1 tablet by mouth at bedtime   90 tablet   2   . sulfamethoxazole-trimethoprim (BACTRIM DS,SEPTRA DS) 800-160 MG per tablet   Oral   Take 1 tablet by mouth 2 (two) times daily.   10 tablet   0   . zolpidem (AMBIEN) 10 MG tablet      TAKE 1 TABLET BY MOUTH AT BEDTIME AS NEEDED   30 tablet   3    BP 134/47  Pulse 101  Temp(Src) 99.2 F (37.3 C) (Oral)  Resp 24  SpO2 95% Physical Exam  Vitals reviewed. Constitutional: She is oriented to person, place, and time. She appears well-developed and well-nourished.  HENT:  Head: Normocephalic and atraumatic.  Right Ear: External ear normal.  Left Ear: External ear normal.  Nose: Nose normal.  Eyes: Right eye exhibits no discharge. Left eye exhibits no discharge.  Cardiovascular: Regular rhythm, normal heart sounds and intact distal pulses.  Tachycardia present.   Pulmonary/Chest: Effort normal and breath sounds normal.   Abdominal: Soft. There is no tenderness. There is no CVA tenderness.  Neurological: She is alert and oriented to person, place, and time. She has normal strength. No cranial nerve deficit or sensory deficit. GCS eye subscore is 4. GCS verbal subscore is 5. GCS motor subscore is 6.  Skin: Skin is warm and dry.    ED Course  Procedures (including critical care time) Labs Review Labs Reviewed  BASIC METABOLIC PANEL - Abnormal; Notable for the following:    Sodium 128 (*)    Chloride 91 (*)    Glucose, Bld 220 (*)    Creatinine, Ser 1.16 (*)    GFR calc non Af Amer 43 (*)    GFR calc Af Amer 50 (*)    All other components within normal limits  CBC WITH DIFFERENTIAL - Abnormal; Notable for the following:    WBC 16.5 (*)    RBC 3.85 (*)    Hemoglobin 11.6 (*)    HCT 33.3 (*)    Neutrophils Relative % 86 (*)    Neutro Abs 14.3 (*)    Lymphocytes Relative 5 (*)    Monocytes Absolute 1.5 (*)    All other components within normal limits  HEPATIC FUNCTION PANEL - Abnormal; Notable for the following:    Albumin 3.2 (*)    AST 40 (*)    ALT 37 (*)    Indirect Bilirubin 0.2 (*)    All other components within normal limits  URINE CULTURE  CULTURE, BLOOD (ROUTINE X 2)  CULTURE, BLOOD (ROUTINE X 2)  URINALYSIS, ROUTINE W REFLEX MICROSCOPIC  CG4 I-STAT (LACTIC ACID)    Date: 06/24/2013  Rate: 96  Rhythm: normal sinus rhythm  QRS Axis: left  Intervals: normal  ST/T Wave abnormalities: nonspecific ST/T changes  Conduction Disutrbances:none  Narrative Interpretation: No acute ischemia  Old EKG Reviewed: unchanged   Imaging Review No results found.  MDM   1. Pyelonephritis   2. Weakness    Patient is alert and oriented here, has no focal neurologic signs. Is mildly tachycardic and a rectal temperature show she is afebrile. Given Tylenol, blood cultures and urine culture obtained. Will give Rocephin. Due to her systemic symptoms, comorbidities including age as well as dehydration  I will admit for IV antibiotics and IV fluids.    Audree Camel, MD 06/24/13 [phone]

## 2013-06-24 NOTE — H&P (Signed)
[facility] History and Physical  Laura Salas WUJ:[id number] DOB: Dec 24, 1930 DOA: 06/24/2013  Referring physician:  PCP: Judie Petit, MD  Specialists:   Chief Complaint: weakness, confusion   HPI: Laura Salas is a 79 y.o. female with PMH of HTN, DM, depression, HPL, h/o UTIs presented with 3-4 days of generalized weakness and intermittent confusion that resolved a couple days ago. She saw hew PCP few days ago who prescribed atx/bactrim for UTI. But she di not improve much, ? Not taking meds per her son. She also had a fall with out LOC, no trauma;  He denies any shortness of breath or cough. No chest pain.no focal symptoms    Review of Systems: The patient denies anorexia, fever, weight loss,, vision loss, decreased hearing, hoarseness, chest pain, syncope, dyspnea on exertion, peripheral edema, balance deficits, hemoptysis, abdominal pain, melena, hematochezia, severe indigestion/heartburn, hematuria, incontinence, genital sores, muscle weakness, suspicious skin lesions, transient blindness, difficulty walking, depression, unusual weight change, abnormal bleeding, enlarged lymph nodes, angioedema, and breast masses.    Past Medical History  Diagnosis Date  . Diabetes mellitus   . Hyperlipidemia   . Hypertension   . Osteoporosis   . Depression    Past Surgical History  Procedure Laterality Date  . Cesarean section    . Cataract extraction    . Carpal tunnel release      left   Social History:  reports that she has never smoked. She has never used smokeless tobacco. She reports that she does not drink alcohol or use illicit drugs. Home  With her son   Allergies  Allergen Reactions  . Alendronate Sodium     REACTION: upset stomach, joint and muscle pain  . Codeine Sulfate     REACTION: dizziness, "put her to bed"  . Ibandronate Sodium     REACTION: upset stomach, joint and muscle pain    Family History  Problem Relation Age of Onset  . Cancer Mother     colon  . Heart attack Father   . Heart attack Brother   . Cancer Brother     stomach    (be sure to complete)  Prior to Admission medications   Medication Sig Start Date End Date Taking? Authorizing Provider  amLODipine (NORVASC) 5 MG tablet Take 5 mg by mouth daily.   Yes Historical Provider, MD  aspirin 325 MG tablet Take 325 mg by mouth daily.     Yes Historical Provider, MD  metFORMIN (GLUCOPHAGE) 850 MG tablet Take 850 mg by mouth 2 (two) times daily with a meal.   Yes Historical Provider, MD  simvastatin (ZOCOR) 40 MG tablet Take 40 mg by mouth every evening.   Yes Historical Provider, MD  sulfamethoxazole-trimethoprim (BACTRIM DS) 800-160 MG per tablet Take 1 tablet by mouth 2 (two) times daily. For 5 days; Start date 06/22/13   Yes Historical Provider, MD  zolpidem (AMBIEN) 10 MG tablet Take 10 mg by mouth at bedtime as needed for sleep.   Yes Historical Provider, MD   Physical Exam: Filed Vitals:   06/24/13 1022  BP: 131/52  Pulse: 97  Temp: 99.8 F (37.7 C)  Resp: 18     General:  Alert, oriented   Eyes: perrla   ENT: no oral ucers   Neck: supple   Cardiovascular: s1, s2 rrr  Respiratory: CTA bl   Abdomen: soft, NT   Skin: few abrasion sin extremities   Musculoskeletal: no LE edema   Psychiatric: confusion   Neurologic:  CN 2-12 intact, motor grossly intact   Labs on Admission:  Basic Metabolic Panel:  Recent Labs Lab 06/22/13 1137 06/24/13 0851  NA 133* 128*  K 4.1 3.7  CL 98 91*  CO2 24 24  GLUCOSE 263* 220*  BUN 18 22  CREATININE 1.1 1.16*  CALCIUM 9.2 9.4   Liver Function Tests:  Recent Labs Lab 06/24/13 0851  AST 40*  ALT 37*  ALKPHOS 95  BILITOT 0.4  PROT 7.5  ALBUMIN 3.2*   No results found for this basename: LIPASE, AMYLASE,  in the last 168 hours No results found for this basename: AMMONIA,  in the last 168 hours CBC:  Recent Labs Lab 06/22/13 1137 06/24/13 0851  WBC 20.2* 16.5*  NEUTROABS 17.8* 14.3*  HGB 11.5*  11.6*  HCT 33.9* 33.3*  MCV 89.2 86.5  PLT 221.0 252   Cardiac Enzymes: No results found for this basename: CKTOTAL, CKMB, CKMBINDEX, TROPONINI,  in the last 168 hours  BNP (last 3 results) No results found for this basename: PROBNP,  in the last 8760 hours CBG: No results found for this basename: GLUCAP,  in the last 168 hours  Radiological Exams on Admission: No results found.  EKG: Independently reviewed. NSR, LAD, no acute ST changes   Assessment/Plan Active Problems:   UTI (lower urinary tract infection)   DIABETES MELLITUS, TYPE II   HYPERLIPIDEMIA   DEPRESSION   HYPERTENSION   INSOMNIA-SLEEP DISORDER-UNSPEC    79 y.o. female with PMH of HTN, DM, depression, HPL, h/o UTIs presented with 3-4 days of generalized weakness and intermittent confusion  1.UTI/Sepsis/leukocytosis; hemodynamically stable; previous urine c/s Klebsiella  -start IV atx, IVF; f/u blood, urine c/s  2. AKI with Hypo NA, ? Bactrim effect; with dehydration;  -IVF, monitor renal function  3. HTN stable; resume amlodipine;  4. DM on metformin; HA1C -7.3 (11/2012); hold metformin with AKI, ISS for now  5. Confusion likely UTI related; neuro exam non focal; fall likely due to generalized weakness, no obvious trauma; monitor  -fal precaution    Code Status: full Family Communication: son at the bedside  Disposition Plan: home  Time spent: > 30 minutes   Esperanza Sheets [facility] Pager [phone]   If 7PM-7AM, please contact night-coverage [email or web address] Password [email or web address] 06/24/2013, 11:11 AM

## 2013-06-24 NOTE — Progress Notes (Signed)
Patient arrived to floor from ED with son. Patient A/Ox3. Bed alarm placed. Patient oriented to room. Because of miscommunication ED was not called from unit.

## 2013-06-24 NOTE — ED Notes (Signed)
Pt's son found pt on floor Friday morning.  Pt's son reports pt  was somewhat confused and not aware she was on floor until "she got her wits about her".  Pt was taken to her PCP where she was diagnosed with a UTI and dehydration.  Pt's son reports that pt has vomited 2-3 times in the last 24 hours.  Pt reports she is weaker than normal.

## 2013-06-25 DIAGNOSIS — N12 Tubulo-interstitial nephritis, not specified as acute or chronic: Principal | ICD-10-CM

## 2013-06-25 LAB — GLUCOSE, CAPILLARY: Glucose-Capillary: 115 mg/dL — ABNORMAL HIGH (ref 70–99)

## 2013-06-25 LAB — BASIC METABOLIC PANEL
BUN: 17 mg/dL (ref 6–23)
Chloride: 98 mEq/L (ref 96–112)
GFR calc Af Amer: 61 mL/min — ABNORMAL LOW (ref >90)
Potassium: 3.5 mEq/L (ref 3.5–5.1)
Sodium: 134 mEq/L — ABNORMAL LOW (ref 135–145)

## 2013-06-25 LAB — CBC
HCT: 31.5 % — ABNORMAL LOW (ref 36.0–46.0)
Hemoglobin: 10.9 g/dL — ABNORMAL LOW (ref 12.0–15.0)
RDW: 13.6 % (ref 11.5–15.5)
WBC: 11.4 10*3/uL — ABNORMAL HIGH (ref 4.0–10.5)

## 2013-06-25 LAB — URINE CULTURE

## 2013-06-25 MED ORDER — LEVOFLOXACIN 750 MG PO TABS: 750.0000 mg | ORAL_TABLET | ORAL | Status: DC

## 2013-06-25 MED ORDER — SODIUM CHLORIDE 0.9 % IV SOLN: INTRAVENOUS | Status: DC

## 2013-06-25 NOTE — Progress Notes (Signed)
Pt. Got d/C orders and prescription was call to pharmacy.IV was d/C.pt. Ready to d/C home.

## 2013-06-25 NOTE — Progress Notes (Signed)
[facility]                                                                                   Laura Salas, is a 79 y.o. female  DOB November 24, 1930  MRN [id number].  Admission date:  06/24/2013  Admitting Physician  Esperanza Sheets, MD  Discharge Date:  06/25/2013   Primary MD  Judie Petit, MD  Admission Diagnosis  Type II or unspecified type diabetes mellitus without mention of complication, not stated as uncontrolled [250.00] Confusion [298.9] Weakness [780.79] Pyelonephritis [590.80] UTI (lower urinary tract infection) [599.0]  Discharge Diagnosis     Active Problems:   DIABETES MELLITUS, TYPE II   HYPERLIPIDEMIA   DEPRESSION   HYPERTENSION   INSOMNIA-SLEEP DISORDER-UNSPEC   UTI (lower urinary tract infection)    Past Medical History  Diagnosis Date  . Diabetes mellitus   . Hyperlipidemia   . Hypertension   . Osteoporosis   . Depression     Past Surgical History  Procedure Laterality Date  . Cesarean section    . Cataract extraction    . Carpal tunnel release      left     Recommendations for primary care physician for things to follow:   Kindly follow final blood and urine culture results   Discharge Diagnoses:   Active Problems:   DIABETES MELLITUS, TYPE II   HYPERLIPIDEMIA   DEPRESSION   HYPERTENSION   INSOMNIA-SLEEP DISORDER-UNSPEC   UTI (lower urinary tract infection)    Discharge Condition: stable   Discharge Medications     Medication List    STOP taking these medications       sulfamethoxazole-trimethoprim 800-160 MG per tablet  Commonly known as:  BACTRIM DS      TAKE these medications       amLODipine 5 MG tablet  Commonly known as:  NORVASC  Take 5 mg by mouth daily.     aspirin 325 MG tablet  Take 325 mg by mouth daily.     levofloxacin 750 MG tablet  Commonly known as:  LEVAQUIN  Take 1 tablet (750 mg total) by mouth every other day.     metFORMIN 850 MG tablet  Commonly known as:  GLUCOPHAGE  Take  850 mg by mouth 2 (two) times daily with a meal.     simvastatin 40 MG tablet  Commonly known as:  ZOCOR  Take 40 mg by mouth every evening.     zolpidem 10 MG tablet  Commonly known as:  AMBIEN  Take 10 mg by mouth at bedtime as needed for sleep.         Diet and Activity recommendation: See Discharge Instructions below   Discharge Instructions     Follow with Primary MD Judie Petit, MD in 3 days low on final urine and blood culture results  Get CBC, CMP, checked 3 days by Primary MD and again as instructed by your Primary MD.  Get Medicines reviewed and adjusted.  Please request your Prim.MD to go over all Hospital Tests and Procedure/Radiological results at the follow up, please get all Hospital records sent to your Prim MD by signing hospital release before you  go home.  Activity: As tolerated with Full fall precautions use walker/cane & assistance as needed   Diet:  Heart healthy-low carbohydrate  For Heart failure patients - Check your Weight same time everyday, if you gain over 2 pounds, or you develop in leg swelling, experience more shortness of breath or chest pain, call your Primary MD immediately. Follow Cardiac Low Salt Diet and 1.8 lit/day fluid restriction.  Disposition Home    If you experience worsening of your admission symptoms, develop shortness of breath, life threatening emergency, suicidal or homicidal thoughts you must seek medical attention immediately by calling 911 or calling your MD immediately  if symptoms less severe.  You Must read complete instructions/literature along with all the possible adverse reactions/side effects for all the Medicines you take and that have been prescribed to you. Take any new Medicines after you have completely understood and accpet all the possible adverse reactions/side effects.   Do not drive and provide baby sitting services if your were admitted for syncope or siezures until you have seen by Primary MD or a  Neurologist and advised to do so again.  Do not drive when taking Pain medications.    Do not take more than prescribed Pain, Sleep and Anxiety Medications  Special Instructions: If you have smoked or chewed Tobacco  in the last 2 yrs please stop smoking, stop any regular Alcohol  and or any Recreational drug use.  Wear Seat belts while driving.   Please note  You were cared for by a hospitalist during your hospital stay. If you have any questions about your discharge medications or the care you received while you were in the hospital after you are discharged, you can call the unit and asked to speak with the hospitalist on call if the hospitalist that took care of you is not available. Once you are discharged, your primary care physician will handle any further medical issues. Please note that NO REFILLS for any discharge medications will be authorized once you are discharged, as it is imperative that you return to your primary care physician (or establish a relationship with a primary care physician if you do not have one) for your aftercare needs so that they can reassess your need for medications and monitor your lab values.       Follow-up Information   Follow up with Judie Petit, MD. Schedule an appointment as soon as possible for a visit in 3 days.   Specialties:  Internal Medicine, Radiology   Contact information:   [address] [place] [address] [phone]         Consults      History of present illness and  Hospital Course:     Kindly see H&P for history of present illness and admission details, please review complete Labs, Consult reports and Test reports for all details in brief Laura Salas, is a 78 y.o. female, patient with history of DM type II, hypertension, dyslipidemia was admitted to the hospital yesterday with chief complaints of generalized weakness and confusion secondary to UTI, she had recently been diagnosed with UTI in the  outpatient setting in placed on Bactrim, she did have leukocytosis which was responding well to Bactrim, however overall symptoms were not much better. When she was admitted to the hospital she had evidence of UTI, delirium along with acute renal insufficiency likely due to Bactrim.   He was admitted to the hospital she was provided with IV fluids, Bactrim was discontinued and she  was placed on Levaquin with excellent results, this morning she feels back to baseline, she ambulated in the hallway without any assistance and felt great, she is requesting to be discharged home, her generalized weakness is much better however I will request a home physical therapy if she qualifies, will have physical therapy with the patient her prior to discharge. Note her blood and urine cultures are still pending and I have requested her to follow with her primary care physician in 3 days to follow on final and updated culture results.    Her acute renal insufficiency has almost completely resolved and she is back to her baseline renal function. She will commence her home medications for her diabetes mellitus and hypertension.     Today   Subjective:   Cai Flott today has no headache,no chest abdominal pain,no new weakness tingling or numbness, feels much better wants to go home today.   Objective:   Blood pressure 111/43, pulse 96 (bedside), temperature 99.8 F (37.7 C), temperature source Oral, resp. rate 18, height 5\' 2"  (1.575 m), weight 65.273 kg (143 lb 14.4 oz), SpO2 95.00%.   Intake/Output Summary (Last 24 hours) at 06/25/13 1009 Last data filed at 06/25/13 0843  Gross per 24 hour  Intake 953.33 ml  Output      0 ml  Net 953.33 ml    Exam Awake Alert, Oriented *3, No new F.N deficits, Normal affect Rives.AT,PERRAL Supple Neck,No JVD, No cervical lymphadenopathy appriciated.  Symmetrical Chest wall movement, Good air movement bilaterally, CTAB RRR,No Gallops,Rubs or new Murmurs, No  Parasternal Heave +ve B.Sounds, Abd Soft, Non tender, No organomegaly appriciated, No rebound -guarding or rigidity. No Cyanosis, Clubbing or edema, No new Rash or bruise  Data Review   Major procedures and Radiology Reports - PLEASE review detailed and final reports for all details, in brief -       No results found.  Micro Results      Recent Results (from the past 240 hour(s))  CULTURE, BLOOD (ROUTINE X 2)     Status: None   Collection Time    06/24/13  9:45 AM      Result Value Range Status   Specimen Description BLOOD LEFT ARM   Final   Special Requests BOTTLES DRAWN AEROBIC ONLY 10CC   Final   Culture  Setup Time     Final   Value: 06/24/2013 16:34     Performed at [facility]   Culture     Final   Value:        BLOOD CULTURE RECEIVED NO GROWTH TO DATE CULTURE WILL BE HELD FOR 5 DAYS BEFORE ISSUING A FINAL NEGATIVE REPORT     Performed at [facility]   Report Status PENDING   Incomplete  CULTURE, BLOOD (ROUTINE X 2)     Status: None   Collection Time    06/24/13  9:50 AM      Result Value Range Status   Specimen Description BLOOD RIGHT ARM   Final   Special Requests BOTTLES DRAWN AEROBIC AND ANAEROBIC 10CC   Final   Culture  Setup Time     Final   Value: 06/24/2013 19:06     Performed at [facility]   Culture     Final   Value:        BLOOD CULTURE RECEIVED NO GROWTH TO DATE CULTURE WILL BE HELD FOR 5 DAYS BEFORE ISSUING A FINAL NEGATIVE REPORT     Performed at  [facility]   Report Status PENDING   Incomplete     CBC w Diff: Lab Results  Component Value Date   WBC 11.4* 06/25/2013   HGB 10.9* 06/25/2013   HCT 31.5* 06/25/2013   PLT 246 06/25/2013   LYMPHOPCT 5* 06/24/2013   MONOPCT 9 06/24/2013   EOSPCT 0 06/24/2013   BASOPCT 0 06/24/2013    CMP: Lab Results  Component Value Date   NA 134* 06/25/2013   K 3.5 06/25/2013   CL 98 06/25/2013   CO2 23 06/25/2013   BUN 17 06/25/2013   CREATININE 0.98 06/25/2013   PROT 7.5 06/24/2013    ALBUMIN 3.2* 06/24/2013   BILITOT 0.4 06/24/2013   ALKPHOS 95 06/24/2013   AST 40* 06/24/2013   ALT 37* 06/24/2013  .   Total Time in preparing paper work, data evaluation and todays exam - 35 minutes  Leroy Sea M.D on 06/25/2013 at 10:09 AM  [facility] Office  [phone]

## 2013-06-25 NOTE — Progress Notes (Signed)
Spoke with patient and son in pt's room about HHPT and DME needs. Pt felt  RW for ambulation was necessary as her falls tend to be at night when she wakes up to use the bathroom. Offered bedside commode but pt declined at this time.  Offered choice of HH agencies locally and pt chose [facility] as she has heard good things about them. Confirmed address and phone number as listed in EPIC.  Name and phone number of pt's son are also correct.

## 2013-06-26 ENCOUNTER — Telehealth: Payer: Self-pay | Admitting: Internal Medicine

## 2013-06-26 NOTE — Discharge Summary (Signed)
[facility]                                                                                   Laura Salas, is a 79 y.o. female  DOB 26-Aug-1931  MRN [id number].  Admission date:  06/24/2013  Admitting Physician  Esperanza Sheets, MD  Discharge Date:  06/25/2013  Primary MD  Judie Petit, MD  Admission Diagnosis  Type II or unspecified type diabetes mellitus without mention of complication, not stated as uncontrolled [250.00] Confusion [298.9] Weakness [780.79] Pyelonephritis [590.80] UTI (lower urinary tract infection) [599.0]  Discharge Diagnosis     Active Problems:   DIABETES MELLITUS, TYPE II   HYPERLIPIDEMIA   DEPRESSION   HYPERTENSION   INSOMNIA-SLEEP DISORDER-UNSPEC   UTI (lower urinary tract infection)    Past Medical History  Diagnosis Date  . Diabetes mellitus   . Hyperlipidemia   . Hypertension   . Osteoporosis   . Depression     Past Surgical History  Procedure Laterality Date  . Cesarean section    . Cataract extraction    . Carpal tunnel release      left     Recommendations for primary care physician for things to follow:   Kindly follow final blood and urine culture results   Discharge Diagnoses:   Active Problems:   DIABETES MELLITUS, TYPE II   HYPERLIPIDEMIA   DEPRESSION   HYPERTENSION   INSOMNIA-SLEEP DISORDER-UNSPEC   UTI (lower urinary tract infection)    Discharge Condition: stable   Discharge Medications     Medication List    STOP taking these medications       sulfamethoxazole-trimethoprim 800-160 MG per tablet  Commonly known as:  BACTRIM DS      TAKE these medications       amLODipine 5 MG tablet  Commonly known as:  NORVASC  Take 5 mg by mouth daily.     aspirin 325 MG tablet  Take 325 mg by mouth daily.     levofloxacin 750 MG tablet  Commonly known as:  LEVAQUIN  Take 1 tablet (750 mg total) by mouth every other day.     metFORMIN 850 MG tablet  Commonly known as:  GLUCOPHAGE  Take 850  mg by mouth 2 (two) times daily with a meal.     simvastatin 40 MG tablet  Commonly known as:  ZOCOR  Take 40 mg by mouth every evening.     zolpidem 10 MG tablet  Commonly known as:  AMBIEN  Take 10 mg by mouth at bedtime as needed for sleep.         Diet and Activity recommendation: See Discharge Instructions below   Discharge Instructions     Follow with Primary MD Judie Petit, MD in 3 days low on final urine and blood culture results  Get CBC, CMP, checked 3 days by Primary MD and again as instructed by your Primary MD.  Get Medicines reviewed and adjusted.  Please request your Prim.MD to go over all Hospital Tests and Procedure/Radiological results at the follow up, please get all Hospital records sent to your Prim MD by signing hospital release before you go  home.  Activity: As tolerated with Full fall precautions use walker/cane & assistance as needed   Diet:  Heart healthy-low carbohydrate  For Heart failure patients - Check your Weight same time everyday, if you gain over 2 pounds, or you develop in leg swelling, experience more shortness of breath or chest pain, call your Primary MD immediately. Follow Cardiac Low Salt Diet and 1.8 lit/day fluid restriction.  Disposition Home    If you experience worsening of your admission symptoms, develop shortness of breath, life threatening emergency, suicidal or homicidal thoughts you must seek medical attention immediately by calling 911 or calling your MD immediately  if symptoms less severe.  You Must read complete instructions/literature along with all the possible adverse reactions/side effects for all the Medicines you take and that have been prescribed to you. Take any new Medicines after you have completely understood and accpet all the possible adverse reactions/side effects.   Do not drive and provide baby sitting services if your were admitted for syncope or siezures until you have seen by Primary MD or a  Neurologist and advised to do so again.  Do not drive when taking Pain medications.    Do not take more than prescribed Pain, Sleep and Anxiety Medications  Special Instructions: If you have smoked or chewed Tobacco  in the last 2 yrs please stop smoking, stop any regular Alcohol  and or any Recreational drug use.  Wear Seat belts while driving.   Please note  You were cared for by a hospitalist during your hospital stay. If you have any questions about your discharge medications or the care you received while you were in the hospital after you are discharged, you can call the unit and asked to speak with the hospitalist on call if the hospitalist that took care of you is not available. Once you are discharged, your primary care physician will handle any further medical issues. Please note that NO REFILLS for any discharge medications will be authorized once you are discharged, as it is imperative that you return to your primary care physician (or establish a relationship with a primary care physician if you do not have one) for your aftercare needs so that they can reassess your need for medications and monitor your lab values.   Follow-up Information   Follow up with Judie Petit, MD. Schedule an appointment as soon as possible for a visit in 3 days.   Specialties:  Internal Medicine, Radiology   Contact information:   [address] [place] [address] [phone]         Consults      History of present illness and  Hospital Course:     Kindly see H&P for history of present illness and admission details, please review complete Labs, Consult reports and Test reports for all details in brief Laura Salas, is a 79 y.o. female, patient with history of DM type II, hypertension, dyslipidemia was admitted to the hospital yesterday with chief complaints of generalized weakness and confusion secondary to UTI, she had recently been diagnosed with UTI in the outpatient  setting in placed on Bactrim, she did have leukocytosis which was responding well to Bactrim, however overall symptoms were not much better. When she was admitted to the hospital she had evidence of UTI, delirium along with acute renal insufficiency likely due to Bactrim.   He was admitted to the hospital she was provided with IV fluids, Bactrim was discontinued and she was placed on Levaquin with  excellent results, this morning she feels back to baseline, she ambulated in the hallway without any assistance and felt great, she is requesting to be discharged home, her generalized weakness is much better however I will request a home physical therapy if she qualifies, will have physical therapy with the patient her prior to discharge. Note her blood and urine cultures are still pending and I have requested her to follow with her primary care physician in 3 days to follow on final and updated culture results.    Her acute renal insufficiency has almost completely resolved and she is back to her baseline renal function. She will commence her home medications for her diabetes mellitus and hypertension.     Today   Subjective:   Santosha Jividen today has no headache,no chest abdominal pain,no new weakness tingling or numbness, feels much better wants to go home today.   Objective:   Blood pressure 111/43, pulse 96 (bedside), temperature 99.8 F (37.7 C), temperature source Oral, resp. rate 18, height 5\' 2"  (1.575 m), weight 65.273 kg (143 lb 14.4 oz), SpO2 95.00%.   Intake/Output Summary (Last 24 hours) at 06/26/13 0837 Last data filed at 06/25/13 0843  Gross per 24 hour  Intake    120 ml  Output      0 ml  Net    120 ml    Exam Awake Alert, Oriented *3, No new F.N deficits, Normal affect Farley.AT,PERRAL Supple Neck,No JVD, No cervical lymphadenopathy appriciated.  Symmetrical Chest wall movement, Good air movement bilaterally, CTAB RRR,No Gallops,Rubs or new Murmurs, No Parasternal  Heave +ve B.Sounds, Abd Soft, Non tender, No organomegaly appriciated, No rebound -guarding or rigidity. No Cyanosis, Clubbing or edema, No new Rash or bruise  Data Review   Major procedures and Radiology Reports - PLEASE review detailed and final reports for all details, in brief -       No results found.  Micro Results      Recent Results (from the past 240 hour(s))  URINE CULTURE     Status: None   Collection Time    06/24/13  9:43 AM      Result Value Range Status   Specimen Description URINE, RANDOM   Final   Special Requests NONE   Final   Culture  Setup Time     Final   Value: 06/24/2013 15:07     Performed at [facility] Count     Final   Value: NO GROWTH     Performed at [facility]   Culture     Final   Value: NO GROWTH     Performed at [facility]   Report Status 06/25/2013 FINAL   Final  CULTURE, BLOOD (ROUTINE X 2)     Status: None   Collection Time    06/24/13  9:45 AM      Result Value Range Status   Specimen Description BLOOD LEFT ARM   Final   Special Requests BOTTLES DRAWN AEROBIC ONLY 10CC   Final   Culture  Setup Time     Final   Value: 06/24/2013 16:34     Performed at [facility]   Culture     Final   Value:        BLOOD CULTURE RECEIVED NO GROWTH TO DATE CULTURE WILL BE HELD FOR 5 DAYS BEFORE ISSUING A FINAL NEGATIVE REPORT     Performed at [facility]   Report Status PENDING  Incomplete  CULTURE, BLOOD (ROUTINE X 2)     Status: None   Collection Time    06/24/13  9:50 AM      Result Value Range Status   Specimen Description BLOOD RIGHT ARM   Final   Special Requests BOTTLES DRAWN AEROBIC AND ANAEROBIC 10CC   Final   Culture  Setup Time     Final   Value: 06/24/2013 19:06     Performed at [facility]   Culture     Final   Value:        BLOOD CULTURE RECEIVED NO GROWTH TO DATE CULTURE WILL BE HELD FOR 5 DAYS BEFORE ISSUING A FINAL NEGATIVE REPORT     Performed at [facility]   Report Status PENDING   Incomplete     CBC w Diff: Lab Results  Component Value Date   WBC 11.4* 06/25/2013   HGB 10.9* 06/25/2013   HCT 31.5* 06/25/2013   PLT 246 06/25/2013   LYMPHOPCT 5* 06/24/2013   MONOPCT 9 06/24/2013   EOSPCT 0 06/24/2013   BASOPCT 0 06/24/2013    CMP: Lab Results  Component Value Date   NA 134* 06/25/2013   K 3.5 06/25/2013   CL 98 06/25/2013   CO2 23 06/25/2013   Salas 17 06/25/2013   CREATININE 0.98 06/25/2013   PROT 7.5 06/24/2013   ALBUMIN 3.2* 06/24/2013   BILITOT 0.4 06/24/2013   ALKPHOS 95 06/24/2013   AST 40* 06/24/2013   ALT 37* 06/24/2013  .   Total Time in preparing paper work, data evaluation and todays exam - 35 minutes  Leroy Sea M.D on 06/26/2013 at 8:37 AM  [facility] Office  [phone]

## 2013-06-26 NOTE — Telephone Encounter (Signed)
[facility] Triage Call Report Triage Record Num: [id number] Operator: Maryfrances Bunnell Patient Name: Laura Salas Call Date & Time: 06/22/2013 6:58:46PM Patient Phone: [phone] PCP: Valetta Mole. Swords Patient Gender: Female PCP Fax : [phone] Patient DOB: [birth year] [facility] Name[facility] Reason for Call: Caller: James/Other; PCP: Birdie Sons (Adults only); CB#: [phone]; Call regarding Urinary Pain/Bleeding; Seen 06/22/13, emr reviewed; Diagnosed with UTI; Bactrim ordered; Vomiting after starting Bactrim this evening last around 1800; Had medication in her for 2 hours prior to throwing up; Feels feverish but no thermometer; Seems weaker since coming home; Maybe has had 13-14 oz of water since coming home today prior to throwing up; Son thinks she has urinated since being seen; Vomited last about 1800; Sipping on water; BS 169; Concerned about the vomiting; Per Urinary Symptoms - Female Protocol all emergent symptoms ruled out with exception of Evaluated by provider and has question/concern about their condition, treatment plan, treatment options, follow-up appointments, or other follow-up care." Reviewed with patient s/s dehydration; fall precautions and encouraged to arrange for communication with patient during night so she won't get up alone; Per emr note if patient worsens over the wknd to see ed. Call for questions or needs; Protocol(s) Used: Urinary Symptoms - Female Recommended Outcome per Protocol: Call Provider within 72 Hours Reason for Outcome: Evaluated by provider and has question/concern about their condition, treatment plan, treatment options, follow-up appointments, or other follow-up care. Care Advice: ~ HEALTH PROMOTION / MAINTENANCE ~ Tell your provider about any other symptoms that you are currently experiencing or any that have concerned you. Continue to follow your treatment plan until seeing or talking with your provider. Take all  medications as prescribed. If you have questions or concerns about your treatment plan, ask about them when you call for your appointment. ~ Participating in support groups with other individuals with chronic conditions may be beneficial. If you are interested in doing so ask your provider for a local resource. ~ 08/29/

## 2013-06-26 NOTE — Progress Notes (Signed)
UR COMPLETED  

## 2013-06-28 ENCOUNTER — Telehealth: Payer: Self-pay | Admitting: Internal Medicine

## 2013-06-28 NOTE — Telephone Encounter (Signed)
Put on injection schedule

## 2013-06-28 NOTE — Telephone Encounter (Signed)
PT is requesting orders for a nurse to see the pt in regards to medication teaching. Please assist.

## 2013-06-29 ENCOUNTER — Encounter: Payer: Self-pay | Admitting: Internal Medicine

## 2013-06-29 ENCOUNTER — Telehealth: Payer: Self-pay | Admitting: Internal Medicine

## 2013-06-29 ENCOUNTER — Ambulatory Visit (INDEPENDENT_AMBULATORY_CARE_PROVIDER_SITE_OTHER): Payer: Medicare Other | Admitting: Internal Medicine

## 2013-06-29 VITALS — BP 122/64 | HR 76 | Temp 98.3°F | Wt 144.0 lb

## 2013-06-29 DIAGNOSIS — E119 Type 2 diabetes mellitus without complications: Secondary | ICD-10-CM

## 2013-06-29 DIAGNOSIS — N390 Urinary tract infection, site not specified: Secondary | ICD-10-CM

## 2013-06-29 DIAGNOSIS — Z23 Encounter for immunization: Secondary | ICD-10-CM

## 2013-06-29 DIAGNOSIS — D649 Anemia, unspecified: Secondary | ICD-10-CM

## 2013-06-29 NOTE — Assessment & Plan Note (Signed)
Pyelonephritis-  Resolved Will remove from problem list

## 2013-06-29 NOTE — Assessment & Plan Note (Signed)
a1c- adequately controlled

## 2013-06-29 NOTE — Progress Notes (Signed)
Post hospital visit- pyelonephritis She is still weak but feeling much better- has two more doses of levaquin  Dm- tolerating meds- a1c reviewed  Lipids- tolerating meds  Reviewed pmh, meds, sochx, shx  Ros- still with some fatigue but she is regaining appetite No other complaints  Reviewed vitals  Well-developed well-nourished female in no acute distress. HEENT exam atraumatic, normocephalic, extraocular muscles are intact. Neck is supple. No jugular venous distention no thyromegaly. Chest clear to auscultation without increased work of breathing. Cardiac exam S1 and S2 are regular. Abdominal exam active bowel sounds, soft, nontender. Extremities no edema. Neurologic exam she is alert without any motor sensory deficits. Gait is normal.

## 2013-06-29 NOTE — Telephone Encounter (Signed)
Ok per Dr Swords, verbal order given 

## 2013-06-29 NOTE — Assessment & Plan Note (Signed)
We will f/u in one month

## 2013-06-29 NOTE — Telephone Encounter (Signed)
PT is requesting an order for a home health RN to come to [facility] hours and do some medication teaching. Please advise.

## 2013-06-30 LAB — CULTURE, BLOOD (ROUTINE X 2): Culture: NO GROWTH

## 2013-07-02 ENCOUNTER — Telehealth: Payer: Self-pay | Admitting: Internal Medicine

## 2013-07-02 NOTE — Telephone Encounter (Signed)
The physical therapist is requesting orders for a nurse to see the patient at home, in regards to medication teaching.

## 2013-07-02 NOTE — Telephone Encounter (Signed)
Barbara from [facility] called to let you know that this patient has refused nursing home care.

## 2013-07-06 ENCOUNTER — Ambulatory Visit: Payer: Medicare Other | Admitting: Internal Medicine

## 2013-07-13 ENCOUNTER — Encounter (INDEPENDENT_AMBULATORY_CARE_PROVIDER_SITE_OTHER): Payer: Medicare Other | Admitting: Ophthalmology

## 2013-07-27 ENCOUNTER — Ambulatory Visit (INDEPENDENT_AMBULATORY_CARE_PROVIDER_SITE_OTHER): Payer: Medicare Other | Admitting: Internal Medicine

## 2013-07-27 ENCOUNTER — Encounter: Payer: Self-pay | Admitting: Internal Medicine

## 2013-07-27 VITALS — BP 124/80 | HR 72 | Temp 98.3°F | Ht 62.0 in | Wt 146.0 lb

## 2013-07-27 DIAGNOSIS — N12 Tubulo-interstitial nephritis, not specified as acute or chronic: Secondary | ICD-10-CM

## 2013-07-27 NOTE — Progress Notes (Signed)
She is feeling much better- energy level is back  DM- tolerating meds Lab Results  Component Value Date   HGBA1C 6.9* 06/24/2013   htn- tolerating meds  Reviewed meds, pmh, psh, sochx   patient denies chest pain, shortness of breath, orthopnea. Denies lower extremity edema, abdominal pain, change in appetite, change in bowel movements. Patient denies rashes, musculoskeletal complaints. No other specific complaints in a complete review of systems.    Well-developed well-nourished female in no acute distress. HEENT exam atraumatic, normocephalic, extraocular muscles are intact. Neck is supple. No jugular venous distention no thyromegaly. Chest clear to auscultation without increased work of breathing. Cardiac exam S1 and S2 are regular. Abdominal exam active bowel sounds, soft, nontender.    Pyelonephritis- resolved- Other medical probs stable-- i'll see in 4 months

## 2013-07-28 ENCOUNTER — Other Ambulatory Visit: Payer: Self-pay | Admitting: Internal Medicine

## 2013-08-03 ENCOUNTER — Encounter (INDEPENDENT_AMBULATORY_CARE_PROVIDER_SITE_OTHER): Payer: Medicare Other | Admitting: Ophthalmology

## 2013-08-03 DIAGNOSIS — H43819 Vitreous degeneration, unspecified eye: Secondary | ICD-10-CM

## 2013-08-03 DIAGNOSIS — I10 Essential (primary) hypertension: Secondary | ICD-10-CM

## 2013-08-03 DIAGNOSIS — E11359 Type 2 diabetes mellitus with proliferative diabetic retinopathy without macular edema: Secondary | ICD-10-CM

## 2013-08-03 DIAGNOSIS — E1139 Type 2 diabetes mellitus with other diabetic ophthalmic complication: Secondary | ICD-10-CM

## 2013-08-03 DIAGNOSIS — H35039 Hypertensive retinopathy, unspecified eye: Secondary | ICD-10-CM

## 2013-09-13 ENCOUNTER — Other Ambulatory Visit: Payer: Self-pay | Admitting: Internal Medicine

## 2013-10-25 LAB — HM DIABETES FOOT EXAM

## 2013-10-25 LAB — HM DIABETES EYE EXAM

## 2013-10-26 ENCOUNTER — Other Ambulatory Visit: Payer: Self-pay | Admitting: Internal Medicine

## 2013-11-12 ENCOUNTER — Other Ambulatory Visit: Payer: Self-pay | Admitting: Internal Medicine

## 2013-11-30 ENCOUNTER — Ambulatory Visit: Payer: Medicare Other | Admitting: Internal Medicine

## 2013-12-03 ENCOUNTER — Ambulatory Visit (INDEPENDENT_AMBULATORY_CARE_PROVIDER_SITE_OTHER): Payer: Medicare Other | Admitting: Internal Medicine

## 2013-12-03 ENCOUNTER — Encounter: Payer: Self-pay | Admitting: Internal Medicine

## 2013-12-03 VITALS — BP 134/72 | HR 80 | Temp 98.1°F | Ht 62.0 in | Wt 148.0 lb

## 2013-12-03 DIAGNOSIS — D649 Anemia, unspecified: Secondary | ICD-10-CM

## 2013-12-03 DIAGNOSIS — E119 Type 2 diabetes mellitus without complications: Secondary | ICD-10-CM

## 2013-12-03 LAB — BASIC METABOLIC PANEL
BUN: 20 mg/dL (ref 6–23)
CHLORIDE: 106 meq/L (ref 96–112)
CO2: 23 mEq/L (ref 19–32)
Calcium: 9.6 mg/dL (ref 8.4–10.5)
Creatinine, Ser: 0.9 mg/dL (ref 0.4–1.2)
GFR: 61.55 mL/min (ref >60.00)
GLUCOSE: 124 mg/dL — AB (ref 70–99)
POTASSIUM: 5 meq/L (ref 3.5–5.1)
SODIUM: 140 meq/L (ref 135–145)

## 2013-12-03 LAB — HEMOGLOBIN A1C: Hgb A1c MFr Bld: 7.3 % — ABNORMAL HIGH (ref 4.6–6.5)

## 2013-12-03 LAB — CBC WITH DIFFERENTIAL/PLATELET
BASOS ABS: 0 10*3/uL (ref 0.0–0.1)
Basophils Relative: 0.5 % (ref 0.0–3.0)
EOS ABS: 0.1 10*3/uL (ref 0.0–0.7)
Eosinophils Relative: 1.3 % (ref 0.0–5.0)
HCT: 36.3 % (ref 36.0–46.0)
HEMOGLOBIN: 11.8 g/dL — AB (ref 12.0–15.0)
LYMPHS PCT: 19.8 % (ref 12.0–46.0)
Lymphs Abs: 1.6 10*3/uL (ref 0.7–4.0)
MCHC: 32.6 g/dL (ref 30.0–36.0)
MCV: 90.1 fl (ref 78.0–100.0)
MONOS PCT: 7.4 % (ref 3.0–12.0)
Monocytes Absolute: 0.6 10*3/uL (ref 0.1–1.0)
NEUTROS PCT: 71 % (ref 43.0–77.0)
Neutro Abs: 5.6 10*3/uL (ref 1.4–7.7)
PLATELETS: 241 10*3/uL (ref 150.0–400.0)
RBC: 4.02 Mil/uL (ref 3.87–5.11)
RDW: 14.2 % (ref 11.5–14.6)
WBC: 7.8 10*3/uL (ref 4.5–10.5)

## 2013-12-03 LAB — HEPATIC FUNCTION PANEL
ALBUMIN: 4 g/dL (ref 3.5–5.2)
ALK PHOS: 72 U/L (ref 39–117)
ALT: 19 U/L (ref 0–35)
AST: 19 U/L (ref 0–37)
BILIRUBIN DIRECT: 0.1 mg/dL (ref 0.0–0.3)
TOTAL PROTEIN: 7.3 g/dL (ref 6.0–8.3)
Total Bilirubin: 0.8 mg/dL (ref 0.3–1.2)

## 2013-12-03 LAB — LIPID PANEL
CHOLESTEROL: 147 mg/dL (ref 0–200)
HDL: 64.2 mg/dL (ref >39.00)
LDL CALC: 63 mg/dL (ref 0–99)
TRIGLYCERIDES: 99 mg/dL (ref 0.0–149.0)
Total CHOL/HDL Ratio: 2
VLDL: 19.8 mg/dL (ref 0.0–40.0)

## 2013-12-03 LAB — MICROALBUMIN / CREATININE URINE RATIO
Creatinine,U: 129.7 mg/dL
MICROALB UR: 2 mg/dL — AB (ref 0.0–1.9)
MICROALB/CREAT RATIO: 1.5 mg/g (ref 0.0–30.0)

## 2013-12-03 LAB — FERRITIN: FERRITIN: 37.5 ng/mL (ref 10.0–291.0)

## 2013-12-03 NOTE — Progress Notes (Signed)
Pre visit review using our clinic review tool, if applicable. No additional management support is needed unless otherwise documented below in the visit note. 

## 2013-12-03 NOTE — Assessment & Plan Note (Signed)
She will occasionally check a cbg in the a.m. (90s) Will check labs

## 2013-12-03 NOTE — Assessment & Plan Note (Signed)
Will check labs today Lab Results  Component Value Date   HGB 10.9* 06/25/2013   Lab Results  Component Value Date   FERRITIN 33.7 08/15/2009

## 2013-12-03 NOTE — Progress Notes (Signed)
patient comes in for followup of multiple medical problems including type 2 diabetes, hyperlipidemia, hypertension. The patient does not check blood sugar or blood pressure at home. The patetient does not follow an exercise or diet program. The patient denies any polyuria, polydipsia.  In the past the patient has gone to diabetic treatment center. The patient is tolerating medications  Without difficulty. The patient does admit to medication compliance.   Past Medical History  Diagnosis Date  . Diabetes mellitus   . Hyperlipidemia   . Hypertension   . Osteoporosis   . Depression     History   Social History  . Marital Status: Widowed    Spouse Name: N/A    Number of Children: N/A  . Years of Education: N/A   Occupational History  . Not on file.   Social History Main Topics  . Smoking status: Never Smoker   . Smokeless tobacco: Never Used  . Alcohol Use: No  . Drug Use: No  . Sexual Activity:    Other Topics Concern  . Not on file   Social History Narrative  . No narrative on file    Past Surgical History  Procedure Laterality Date  . Cesarean section    . Cataract extraction    . Carpal tunnel release      left    Family History  Problem Relation Age of Onset  . Cancer Mother     colon  . Heart attack Father   . Heart attack Brother   . Cancer Brother     stomach    Allergies  Allergen Reactions  . Alendronate Sodium     REACTION: upset stomach, joint and muscle pain  . Codeine Sulfate     REACTION: dizziness, "put her to bed"  . Ibandronate Sodium     REACTION: upset stomach, joint and muscle pain    Current Outpatient Prescriptions on File Prior to Visit  Medication Sig Dispense Refill  . amLODipine (NORVASC) 5 MG tablet Take 5 mg by mouth daily.      Marland Kitchen aspirin 325 MG tablet Take 325 mg by mouth daily.        . metFORMIN (GLUCOPHAGE) 850 MG tablet take 1 tablet by mouth twice a day  60 tablet  4  . simvastatin (ZOCOR) 40 MG tablet take 1 tablet  by mouth at bedtime  90 tablet  2  . zolpidem (AMBIEN) 10 MG tablet TAKE 1 TABLET BY MOUTH AT BEDTIME AS NEEDED  30 tablet  2   No current facility-administered medications on file prior to visit.     patient denies chest pain, shortness of breath, orthopnea. Denies lower extremity edema, abdominal pain, change in appetite, change in bowel movements. Patient denies rashes, musculoskeletal complaints. No other specific complaints in a complete review of systems.   BP 134/72  Pulse 80  Temp(Src) 98.1 F (36.7 C) (Oral)  Ht 5\' 2"  (1.575 m)  Wt 148 lb (67.132 kg)  BMI 27.06 kg/m2  Well-developed well-nourished female in no acute distress. HEENT exam atraumatic, normocephalic, extraocular muscles are intact. Neck is supple. No jugular venous distention no thyromegaly. Chest clear to auscultation without increased work of breathing. Cardiac exam S1 and S2 are regular. Abdominal exam active bowel sounds, soft, nontender. Extremities no edema. Neurologic exam she is alert without any motor sensory deficits. Gait is normal.

## 2013-12-06 ENCOUNTER — Telehealth: Payer: Self-pay

## 2013-12-06 ENCOUNTER — Telehealth: Payer: Self-pay | Admitting: Internal Medicine

## 2013-12-06 NOTE — Telephone Encounter (Addendum)
Pt wanted to know if you give her some Meloxicam 7.5 mg for arthritis.  She stated you wanted to see her lab results first.  [place]

## 2013-12-06 NOTE — Telephone Encounter (Signed)
Pt needs blood work results °

## 2013-12-06 NOTE — Telephone Encounter (Signed)
Relevant patient education mailed to patient.  

## 2013-12-06 NOTE — Telephone Encounter (Signed)
See result note.  

## 2014-01-08 ENCOUNTER — Other Ambulatory Visit: Payer: Self-pay | Admitting: Internal Medicine

## 2014-02-07 ENCOUNTER — Other Ambulatory Visit: Payer: Self-pay | Admitting: Internal Medicine

## 2014-05-23 ENCOUNTER — Encounter: Payer: Self-pay | Admitting: Gastroenterology

## 2014-06-07 ENCOUNTER — Ambulatory Visit: Payer: Medicare Other | Admitting: Internal Medicine

## 2014-07-05 ENCOUNTER — Ambulatory Visit (INDEPENDENT_AMBULATORY_CARE_PROVIDER_SITE_OTHER): Payer: Medicare Other | Admitting: Family Medicine

## 2014-07-05 ENCOUNTER — Encounter: Payer: Self-pay | Admitting: Family Medicine

## 2014-07-05 ENCOUNTER — Ambulatory Visit: Payer: Medicare Other | Admitting: Family Medicine

## 2014-07-05 VITALS — BP 123/68 | HR 96 | Temp 98.0°F | Wt 146.0 lb

## 2014-07-05 DIAGNOSIS — E119 Type 2 diabetes mellitus without complications: Secondary | ICD-10-CM

## 2014-07-05 DIAGNOSIS — N183 Chronic kidney disease, stage 3 unspecified: Secondary | ICD-10-CM | POA: Insufficient documentation

## 2014-07-05 DIAGNOSIS — Z23 Encounter for immunization: Secondary | ICD-10-CM

## 2014-07-05 DIAGNOSIS — G4700 Insomnia, unspecified: Secondary | ICD-10-CM

## 2014-07-05 DIAGNOSIS — N182 Chronic kidney disease, stage 2 (mild): Secondary | ICD-10-CM | POA: Insufficient documentation

## 2014-07-05 DIAGNOSIS — I10 Essential (primary) hypertension: Secondary | ICD-10-CM

## 2014-07-05 LAB — COMPREHENSIVE METABOLIC PANEL
ALBUMIN: 4.1 g/dL (ref 3.5–5.2)
ALK PHOS: 71 U/L (ref 39–117)
ALT: 17 U/L (ref 0–35)
AST: 20 U/L (ref 0–37)
BUN: 19 mg/dL (ref 6–23)
CALCIUM: 9.5 mg/dL (ref 8.4–10.5)
CHLORIDE: 105 meq/L (ref 96–112)
CO2: 26 mEq/L (ref 19–32)
Creatinine, Ser: 1 mg/dL (ref 0.4–1.2)
GFR: 59.25 mL/min — ABNORMAL LOW (ref >60.00)
GLUCOSE: 130 mg/dL — AB (ref 70–99)
POTASSIUM: 5 meq/L (ref 3.5–5.1)
SODIUM: 140 meq/L (ref 135–145)
TOTAL PROTEIN: 7.5 g/dL (ref 6.0–8.3)
Total Bilirubin: 0.6 mg/dL (ref 0.2–1.2)

## 2014-07-05 LAB — CBC
HCT: 36.2 % (ref 36.0–46.0)
Hemoglobin: 11.9 g/dL — ABNORMAL LOW (ref 12.0–15.0)
MCHC: 33 g/dL (ref 30.0–36.0)
MCV: 91.4 fl (ref 78.0–100.0)
PLATELETS: 233 10*3/uL (ref 150.0–400.0)
RBC: 3.96 Mil/uL (ref 3.87–5.11)
RDW: 13.8 % (ref 11.5–15.5)
WBC: 7.2 10*3/uL (ref 4.0–10.5)

## 2014-07-05 LAB — HEMOGLOBIN A1C: HEMOGLOBIN A1C: 6.7 % — AB (ref 4.6–6.5)

## 2014-07-05 MED ORDER — TRAZODONE HCL 50 MG PO TABS: 25.0000 mg | ORAL_TABLET | Freq: Every evening | ORAL | Status: DC | PRN

## 2014-07-05 MED ORDER — MELOXICAM 15 MG PO TABS: 15.0000 mg | ORAL_TABLET | Freq: Every day | ORAL | Status: DC

## 2014-07-05 MED ORDER — ZOLPIDEM TARTRATE 5 MG PO TABS: 5.0000 mg | ORAL_TABLET | Freq: Every evening | ORAL | Status: DC | PRN

## 2014-07-05 NOTE — Progress Notes (Signed)
Garret Reddish, MD Phone: [phone]  Subjective:   Laura Salas is a 80 y.o. year old very pleasant female patient who presents with the following:  Insomnia-well-controlled Discussed benefits and risks of Lorrin Mais and that patient was above Max dose for women and her age. ROS-no SI HI or vivid dreams  Hypertension-well-controlled BP Readings from Last 3 Encounters:  07/05/14 123/68  12/03/13 134/72  07/27/13 124/80  Home BP monitoring-yes regularly 120/80 Compliant with medications-yes without side effects ROS-Denies any CP, HA, SOB, blurry vision, LE edema.   DIABETES Type II- reasonable control given age Lab Results  Component Value Date   HGBA1C 7.3* 12/03/2013   HGBA1C 6.9* 06/24/2013   HGBA1C 7.3* 12/11/2012  Medications taking and tolerating-yes Blood Sugars per patient-fasting-does not check regularly On Aspirin-yes On statin-yes Daily foot monitoring-yes  ROS- Denies Polyuria,Polydipsia, nocturia, Vision changes. Denies Hypoglycemia symptoms (shaky, sweaty, hungry, weak anxious, tremor, palpitations, confusion, behavior change).   Past Medical History- Patient Active Problem List   Diagnosis Date Noted  . Diabetes Type II with retinopathy 05/15/2007    Priority: High  . INSOMNIA-SLEEP DISORDER-UNSPEC 06/14/2008    Priority: Medium  . HYPERLIPIDEMIA 05/15/2007    Priority: Medium  . HYPERTENSION 05/15/2007    Priority: Medium  . OSTEOPOROSIS 12/12/2009    Priority: Low  . INTENTION TREMOR 10/11/2008    Priority: Low  . DEPRESSION 06/14/2008    Priority: Low  . ANEMIA, OTHER UNSPEC 01/03/2008    Priority: Low   Medications- reviewed and updated Current Outpatient Prescriptions  Medication Sig Dispense Refill  . amLODipine (NORVASC) 5 MG tablet take 1 tablet by mouth once daily  30 tablet  5  . aspirin 325 MG tablet Take 325 mg by mouth daily.        . metFORMIN (GLUCOPHAGE) 850 MG tablet take 1 tablet by mouth twice a day  60 tablet  4  .  simvastatin (ZOCOR) 40 MG tablet take 1 tablet by mouth at bedtime  90 tablet  2  . meloxicam (MOBIC) 15 MG tablet Take 1 tablet (15 mg total) by mouth daily.  30 tablet  0  . traZODone (DESYREL) 50 MG tablet Take 0.5-1 tablets (25-50 mg total) by mouth at bedtime as needed for sleep (Start with 1/2 a pill until we talk. Take with 5mg  ambien).  30 tablet  3  . zolpidem (AMBIEN) 5 MG tablet Take 1 tablet (5 mg total) by mouth at bedtime as needed for sleep.  15 tablet  1   No current facility-administered medications for this visit.   Objective: BP 123/68  Pulse 96  Temp(Src) 98 F (36.7 C)  Wt 146 lb (66.225 kg) Gen: NAD, resting comfortably CV: RRR with occasional ectopic beat no murmurs rubs or gallops Lungs: CTAB no crackles, wheeze, rhonchi Abdomen: soft/nontender/nondistended/normal bowel sounds.  Ext: trace edema Skin: warm, dry Neuro: grossly normal, moves all extremities, moves to table without assist  Assessment/Plan:  Diabetes Type II with retinopathy Reasonable control previously. A1c goal less than 7.5. If elevated above this would increase metformin to 1 g twice daily. Check A1c at this time  HYPERTENSION Well-controlled continue amlodipine alone  INSOMNIA-SLEEP DISORDER-UNSPEC Previous well-controlled but above Max dose for age and sex so want to attempt wean. With history of depression trazodone is a good option. Attempt to half dose of Ambien while starting trazodone 25 mg. In one week if tolerable will take 2.5 mg of Ambien along with 50 mg of trazodone in 1 week later  wean off of Ambien. Plan to do this by phone hopefully.   Orders Placed This Encounter  Procedures  . CBC    [place]  . Comprehensive metabolic panel    [place]  . Hemoglobin A1c    [place]   Request MOBIC for arthritis Meds ordered this encounter  Medications  . traZODone (DESYREL) 50 MG tablet    Sig: Take 0.5-1 tablets (25-50 mg total) by mouth at bedtime as needed for sleep (Start with  1/2 a pill until we talk. Take with 5mg  ambien).    Dispense:  30 tablet    Refill:  3  . zolpidem (AMBIEN) 5 MG tablet    Sig: Take 1 tablet (5 mg total) by mouth at bedtime as needed for sleep.    Dispense:  15 tablet    Refill:  1  . meloxicam (MOBIC) 15 MG tablet    Sig: Take 1 tablet (15 mg total) by mouth daily.    Dispense:  30 tablet    Refill:  0

## 2014-07-05 NOTE — Patient Instructions (Signed)
Labs today  Insomnia  Take 5mg  ambien (1/2 pill) for next week  Take 25mg  trazodone (1/2 pill) during this time  Give me an update in 1 week of how you are doing.    LIkely plan: reduce to 2.5 mg ambien and increase to 50mg  in 1 week if doing well (I gave you an rx for 5mg  pills)  Blood pressure looks great  Diabetes-if a1c is above 7-may increase to 1000mg  twice a day  Health Maintenance Due  Topic Date Due  . Influenza Vaccine  05/25/2014  . Hemoglobin A1c  06/02/2014    See me in 3-6 months, Dr. Yong Channel

## 2014-07-05 NOTE — Assessment & Plan Note (Signed)
Reasonable control previously. A1c goal less than 7.5. If elevated above this would increase metformin to 1 g twice daily. Check A1c at this time

## 2014-07-05 NOTE — Assessment & Plan Note (Signed)
Well-controlled continue amlodipine alone

## 2014-07-05 NOTE — Assessment & Plan Note (Signed)
Previous well-controlled but above Max dose for age and sex so want to attempt wean. With history of depression trazodone is a good option. Attempt to half dose of Ambien while starting trazodone 25 mg. In one week if tolerable will take 2.5 mg of Ambien along with 50 mg of trazodone in 1 week later wean off of Ambien. Plan to do this by phone hopefully.

## 2014-07-11 ENCOUNTER — Telehealth: Payer: Self-pay | Admitting: Family Medicine

## 2014-07-11 ENCOUNTER — Other Ambulatory Visit: Payer: Self-pay | Admitting: Internal Medicine

## 2014-07-11 NOTE — Telephone Encounter (Signed)
Pt states dr hunter changed some of her meds and ask her to fu. Pt would like cb to discuss.

## 2014-07-11 NOTE — Telephone Encounter (Signed)
Pt.notified

## 2014-07-11 NOTE — Telephone Encounter (Signed)
Pt states you changed her Ambien from 10mg  to 5mg  and she states that she is not having any problems with the reduction in her Ambien but she does have problems going back to sleep after she goes to the bathroom around 4:00am but other than that she is doing good and she wants to know if she is supposed to take the Trazadone or what do you want her to do next? She also wants to know if you want her to stay on 850mg  or change it to the 1000mg  since her A1c was good at her visit last week. Please advise.

## 2014-07-11 NOTE — Telephone Encounter (Signed)
Continue metformin 850 mg twice a day.  She can increase her trazodone to a full pill of 50 mg. She can now take a half dose of Ambien 5 mg, so she will be taking 2.5 mg. Asked her to update me in one week.

## 2014-07-18 ENCOUNTER — Telehealth: Payer: Self-pay

## 2014-07-18 NOTE — Telephone Encounter (Signed)
Pt states you changed her Ambien and gave her Trazadone. You wanted her to take 2.5mg  of Ambien and a whole tablet of Trazadone and she states it has been hard for her to get to sleep/stay asleep some nights. She said it may be because she hasnt been on this regimen long enough but she wants to make sure that no other adjustments need to be made she said it has helped but with some sturggles. Please advise.

## 2014-07-18 NOTE — Telephone Encounter (Signed)
Pt.notified

## 2014-07-18 NOTE — Telephone Encounter (Signed)
I would like for her to trial current dose for another 2 weeks then call to update me how things are going. There can be some difficulty when coming down to lower dose of Ambien. We will eventually trial off Ambien but want to make sure things stabilize first.

## 2014-08-01 ENCOUNTER — Telehealth: Payer: Self-pay | Admitting: Family Medicine

## 2014-08-01 NOTE — Telephone Encounter (Signed)
Pt called to say the med change has been working well for her and if there is anything else she need to do at this point .Marland Kitchen Want to know if she need to continue taking zolpidem (AMBIEN) 5 MG tablet she is taking 2.5 mg a need. If need to continue taking   zolpidem (AMBIEN) 5 MG tablet she need a rx

## 2014-08-01 NOTE — Telephone Encounter (Signed)
If patient is able to sleep without any Lorrin Mais, that would be my preference. If she has had some nights that she was able to sleep without it, please discontinue ambien permanently. Otherwise, can give new rx to take 1/2 pill but would want her to use very sparingly (preference would be to not use at all)

## 2014-08-01 NOTE — Telephone Encounter (Signed)
Dr. Hunter please advise. 

## 2014-08-01 NOTE — Telephone Encounter (Signed)
Pt states she will try and see how going with out the Ambien works and she will give [place] a call if it does not work out for her.

## 2014-08-07 ENCOUNTER — Telehealth: Payer: Self-pay | Admitting: Family Medicine

## 2014-08-07 MED ORDER — MELOXICAM 15 MG PO TABS: 15.0000 mg | ORAL_TABLET | Freq: Every day | ORAL | Status: DC

## 2014-08-07 NOTE — Telephone Encounter (Signed)
Medication refilled

## 2014-08-07 NOTE — Telephone Encounter (Signed)
Pt request refill of the following: meloxicam (MOBIC) 15 MG tablet    Phamacy: [place]

## 2014-08-16 ENCOUNTER — Ambulatory Visit (INDEPENDENT_AMBULATORY_CARE_PROVIDER_SITE_OTHER): Payer: Medicare Other | Admitting: Ophthalmology

## 2014-08-16 DIAGNOSIS — H35033 Hypertensive retinopathy, bilateral: Secondary | ICD-10-CM

## 2014-08-16 DIAGNOSIS — H43813 Vitreous degeneration, bilateral: Secondary | ICD-10-CM

## 2014-08-16 DIAGNOSIS — H3531 Nonexudative age-related macular degeneration: Secondary | ICD-10-CM

## 2014-08-16 DIAGNOSIS — I10 Essential (primary) hypertension: Secondary | ICD-10-CM

## 2014-08-16 DIAGNOSIS — E11359 Type 2 diabetes mellitus with proliferative diabetic retinopathy without macular edema: Secondary | ICD-10-CM

## 2014-08-16 DIAGNOSIS — E11319 Type 2 diabetes mellitus with unspecified diabetic retinopathy without macular edema: Secondary | ICD-10-CM

## 2014-09-23 ENCOUNTER — Telehealth: Payer: Self-pay | Admitting: Family Medicine

## 2014-09-23 MED ORDER — AMLODIPINE BESYLATE 5 MG PO TABS: 5.0000 mg | ORAL_TABLET | Freq: Every day | ORAL | Status: DC

## 2014-09-23 NOTE — Telephone Encounter (Signed)
Medication refilled

## 2014-09-23 NOTE — Telephone Encounter (Signed)
Pt request refill amLODipine (NORVASC) 5 MG tablet 30 day w/ refills Pt has another fup in march Pt has been out for 4 days and is upset we have not received.  [facility] [facility]   Can you send asap.

## 2014-11-01 ENCOUNTER — Ambulatory Visit (INDEPENDENT_AMBULATORY_CARE_PROVIDER_SITE_OTHER): Payer: Medicare Other | Admitting: Family Medicine

## 2014-11-01 ENCOUNTER — Encounter: Payer: Self-pay | Admitting: Family Medicine

## 2014-11-01 VITALS — BP 132/72 | HR 84 | Temp 98.1°F | Wt 146.0 lb

## 2014-11-01 DIAGNOSIS — G4700 Insomnia, unspecified: Secondary | ICD-10-CM

## 2014-11-01 DIAGNOSIS — N182 Chronic kidney disease, stage 2 (mild): Secondary | ICD-10-CM

## 2014-11-01 MED ORDER — ZOLPIDEM TARTRATE 5 MG PO TABS: 5.0000 mg | ORAL_TABLET | Freq: Every evening | ORAL | Status: DC | PRN

## 2014-11-01 NOTE — Assessment & Plan Note (Signed)
Poor control. Change back to [place] but at age recommended maximum of 5mg . Follow up at establish visit.

## 2014-11-01 NOTE — Patient Instructions (Signed)
We reviewed again your most recent bloodwork.   For sleep-start back on ambien 5mg , take 1/2 a trazodone until you run out.   See you in march

## 2014-11-01 NOTE — Progress Notes (Signed)
  Garret Reddish, MD Phone: [phone]  Subjective:   Laura Salas is a 81 y.o. year old very pleasant female patient who presents with the following:  Insomnia- poor control originally on ambien 10mg . Got off Lorrin Mais and has been taking trazodone 50mg  (an hour before bed). Seems to wake up 1.5-2 hours after trazodone. Toss and turns and at times able to go back to sleep. Typically 4 hours of sleep each night. Much better controlled previously and even when on 2.5 or 5mg  of ativan. Prefers not to take 2 medications.  ROS- no vivid dreams, denies depressive symptoms  CKD Stage II- stable No nsaids ROS- no polyuria or hematuria  Past Medical History- Patient Active Problem List   Diagnosis Date Noted  . Diabetes Type II with retinopathy 05/15/2007    Priority: High  . CKD (chronic kidney disease), stage II 07/05/2014    Priority: Medium  . INSOMNIA-SLEEP DISORDER-UNSPEC 06/14/2008    Priority: Medium  . HYPERLIPIDEMIA 05/15/2007    Priority: Medium  . HYPERTENSION 05/15/2007    Priority: Medium  . OSTEOPOROSIS 12/12/2009    Priority: Low  . INTENTION TREMOR 10/11/2008    Priority: Low  . DEPRESSION 06/14/2008    Priority: Low  . ANEMIA, OTHER UNSPEC 01/03/2008    Priority: Low   Medications- reviewed and updated Current Outpatient Prescriptions  Medication Sig Dispense Refill  . amLODipine (NORVASC) 5 MG tablet Take 1 tablet (5 mg total) by mouth daily. 30 tablet 3  . aspirin 325 MG tablet Take 325 mg by mouth daily.      . metFORMIN (GLUCOPHAGE) 850 MG tablet take 1 tablet by mouth twice a day 60 tablet 3  . simvastatin (ZOCOR) 40 MG tablet take 1 tablet by mouth at bedtime 90 tablet 2  . traZODone (DESYREL) 50 MG tablet Take 0.5-1 tablets (25-50 mg total) by mouth at bedtime as needed for sleep (Start with 1/2 a pill until we talk. Take with 5mg  ambien). 30 tablet 3  . zolpidem (AMBIEN) 5 MG tablet Take 1 tablet (5 mg total) by mouth at bedtime as needed for sleep. 15  tablet 1     Objective: BP 132/72 mmHg  Pulse 84  Temp(Src) 98.1 F (36.7 C)  Wt 146 lb (66.225 kg) Gen: NAD, resting comfortably   Assessment/Plan:  CKD (chronic kidney disease), stage II Patient also asks to review bloodwork that was reviewed by phone and gain a better understanding of CKD stage II. Right on border of stage II and stage III. We discussed potentially changing to lisinopril or losartan if GFR worsens at all though likely this will never be an issue for patient at her age.    Insomnia Poor control. Change back to [place] but at age recommended maximum of 5mg . Follow up at establish visit.   >50% of 25 minute office visit was spent on counseling (discussing meaning of CKD II/III, considering ace-i, discussing sleep medications, comforting patient over lack of sleep) and coordination of care  Return precautions advised. Follow up establish visit to review other medical history.   Meds ordered this encounter  Medications  . zolpidem (AMBIEN) 5 MG tablet    Sig: Take 1 tablet (5 mg total) by mouth at bedtime as needed for sleep.    Dispense:  30 tablet    Refill:  5

## 2014-11-01 NOTE — Assessment & Plan Note (Signed)
Patient also asks to review bloodwork that was reviewed by phone and gain a better understanding of CKD stage II. Right on border of stage II and stage III. We discussed potentially changing to lisinopril or losartan if GFR worsens at all though likely this will never be an issue for patient at her age.

## 2014-12-29 ENCOUNTER — Other Ambulatory Visit: Payer: Self-pay | Admitting: Family Medicine

## 2015-01-03 ENCOUNTER — Encounter: Payer: Self-pay | Admitting: Family Medicine

## 2015-01-03 ENCOUNTER — Ambulatory Visit (INDEPENDENT_AMBULATORY_CARE_PROVIDER_SITE_OTHER): Payer: Medicare Other | Admitting: Family Medicine

## 2015-01-03 VITALS — BP 120/80 | HR 83 | Temp 97.7°F | Wt 146.0 lb

## 2015-01-03 DIAGNOSIS — E11319 Type 2 diabetes mellitus with unspecified diabetic retinopathy without macular edema: Secondary | ICD-10-CM | POA: Diagnosis not present

## 2015-01-03 DIAGNOSIS — G4700 Insomnia, unspecified: Secondary | ICD-10-CM | POA: Diagnosis not present

## 2015-01-03 DIAGNOSIS — I10 Essential (primary) hypertension: Secondary | ICD-10-CM | POA: Diagnosis not present

## 2015-01-03 DIAGNOSIS — E785 Hyperlipidemia, unspecified: Secondary | ICD-10-CM | POA: Diagnosis not present

## 2015-01-03 DIAGNOSIS — Z23 Encounter for immunization: Secondary | ICD-10-CM | POA: Diagnosis not present

## 2015-01-03 LAB — CBC
HEMATOCRIT: 35.1 % — AB (ref 36.0–46.0)
Hemoglobin: 11.8 g/dL — ABNORMAL LOW (ref 12.0–15.0)
MCHC: 33.6 g/dL (ref 30.0–36.0)
MCV: 88.3 fl (ref 78.0–100.0)
Platelets: 257 10*3/uL (ref 150.0–400.0)
RBC: 3.97 Mil/uL (ref 3.87–5.11)
RDW: 14 % (ref 11.5–15.5)
WBC: 7.7 10*3/uL (ref 4.0–10.5)

## 2015-01-03 LAB — LDL CHOLESTEROL, DIRECT: Direct LDL: 61 mg/dL

## 2015-01-03 LAB — BASIC METABOLIC PANEL
BUN: 22 mg/dL (ref 6–23)
CHLORIDE: 104 meq/L (ref 96–112)
CO2: 30 mEq/L (ref 19–32)
CREATININE: 1.01 mg/dL (ref 0.40–1.20)
Calcium: 10 mg/dL (ref 8.4–10.5)
GFR: 55.81 mL/min — ABNORMAL LOW (ref >60.00)
GLUCOSE: 134 mg/dL — AB (ref 70–99)
Potassium: 4.9 mEq/L (ref 3.5–5.1)
Sodium: 139 mEq/L (ref 135–145)

## 2015-01-03 LAB — MICROALBUMIN / CREATININE URINE RATIO
CREATININE, U: 215.4 mg/dL
MICROALB UR: 2.7 mg/dL — AB (ref 0.0–1.9)
Microalb Creat Ratio: 1.3 mg/g (ref 0.0–30.0)

## 2015-01-03 LAB — HEMOGLOBIN A1C: Hgb A1c MFr Bld: 6.9 % — ABNORMAL HIGH (ref 4.6–6.5)

## 2015-01-03 NOTE — Patient Instructions (Addendum)
Received last pneumonia shot today ([id number])  THink you are doing well. Hope a1c still below 7. Update a few other labs.   See you back in 6 months as long as a1c below 7.5.

## 2015-01-03 NOTE — Assessment & Plan Note (Signed)
Well controlled previously. Check direct LDL (try to add on as not ordered at time of visit). Continue current meds:  Simvastatin 40mg 

## 2015-01-03 NOTE — Assessment & Plan Note (Signed)
Controlled on Metformin 850 BID. Check a1c today. 6 month follow up if a1c <7.5. Ideally like below 7 with retinopathy issues

## 2015-01-03 NOTE — Assessment & Plan Note (Signed)
Well controlled. Continue current meds:  Amlodipine 5mg 

## 2015-01-03 NOTE — Assessment & Plan Note (Signed)
Controlled again back on ambien 5mg , fortunately no longer above max dose for age and gender as in past.

## 2015-01-03 NOTE — Progress Notes (Signed)
Garret Reddish, MD Phone: [phone]  Subjective:   Laura Salas is a 81 y.o. year old very pleasant female patient who presents with the following:  Insomnia controlled 7 hours of sleep on ambien instead of trazodone ROS- no si/hi. No vivid dreams. No trouble driving next day  Hypertension-controlled  BP Readings from Last 3 Encounters:  01/03/15 120/80  11/01/14 132/72  07/05/14 123/68   Home BP monitoring-no Compliant with medications-yes without side effects ROS-Denies any CP, HA, SOB, blurry vision, LE edema  DIABETES Type II-controlled previously on metformin 850mg  BID  Lab Results  Component Value Date   HGBA1C 6.7* 07/05/2014   HGBA1C 7.3* 12/03/2013   HGBA1C 6.9* 06/24/2013  Medications taking and tolerating-yes Blood Sugars per patient-fasting-this AM was 84 On Aspirin-yes On statin-yes Daily foot monitoring-yes  ROS- Denies Polyuria,Polydipsia, nocturia, Vision changes, feet or hand numbness/pain/tingling. Denies  Hypoglycemia symptoms (shaky, sweaty, hungry, weak anxious, tremor, palpitations, confusion, behavior change).   Hyperlipidemia-controlled previously on simvastatin 40mg   Lab Results  Component Value Date   LDLCALC 63 12/03/2013  ROS- no chest pain or shortness of breath. No myalgias  Past Medical History- Patient Active Problem List   Diagnosis Date Noted  . Diabetes Type II with retinopathy 05/15/2007    Priority: High  . CKD (chronic kidney disease), stage II 07/05/2014    Priority: Medium  . Insomnia 06/14/2008    Priority: Medium  . Hyperlipidemia 05/15/2007    Priority: Medium  . Essential hypertension 05/15/2007    Priority: Medium  . Osteoporosis 12/12/2009    Priority: Low  . INTENTION TREMOR 10/11/2008    Priority: Low  . DEPRESSION 06/14/2008    Priority: Low  . ANEMIA, OTHER UNSPEC 01/03/2008    Priority: Low   Medications- reviewed and updated Current Outpatient Prescriptions  Medication Sig Dispense Refill  .  amLODipine (NORVASC) 5 MG tablet Take 1 tablet (5 mg total) by mouth daily. 30 tablet 3  . aspirin 325 MG tablet Take 325 mg by mouth daily.      . metFORMIN (GLUCOPHAGE) 850 MG tablet TAKE 1 TABLET BY MOUTH TWICE DAILY. 60 tablet 2  . simvastatin (ZOCOR) 40 MG tablet take 1 tablet by mouth at bedtime 90 tablet 2  . zolpidem (AMBIEN) 5 MG tablet Take 1 tablet (5 mg total) by mouth at bedtime as needed for sleep. (Patient not taking: Reported on 01/03/2015) 30 tablet 5   No current facility-administered medications for this visit.    Objective: BP 120/80 mmHg  Pulse 83  Temp(Src) 97.7 F (36.5 C)  Wt 146 lb (66.225 kg) Gen: NAD, resting comfortably CV: RRR with occasional ectopic/early beat (patient has known for sometime she has this-PACs on EKG in 2007) no murmurs rubs or gallops Lungs: CTAB no crackles, wheeze, rhonchi Abdomen: soft/nontender/nondistended/normal bowel sounds.  Ext: trace edema Skin: warm, dry, no rash Neuro: grossly normal, moves all extremities, normal gait  Diabetic Foot Exam - Simple   Simple Foot Form  Diabetic Foot exam was performed with the following findings:  Yes 01/03/2015  9:15 AM  Visual Inspection  No deformities, no ulcerations, no other skin breakdown bilaterally:  Yes  Sensation Testing  Intact to touch and monofilament testing bilaterally:  Yes  Pulse Check  Posterior Tibialis and Dorsalis pulse intact bilaterally:  Yes  Comments  Hammer toes 2nd toe bilateral      Assessment/Plan:  Insomnia Controlled again back on ambien 5mg , fortunately no longer above max dose for age and gender as  in past.    Essential hypertension Well controlled. Continue current meds:  Amlodipine 5mg     Diabetes Type II with retinopathy Controlled on Metformin 850 BID. Check a1c today. 6 month follow up if a1c <7.5. Ideally like below 7 with retinopathy issues   Hyperlipidemia Well controlled previously. Check direct LDL (try to add on as not ordered at  time of visit). Continue current meds:  Simvastatin 40mg      6 month follow up likely.   Orders Placed This Encounter  Procedures  . Pneumococcal conjugate vaccine 13-valent  . Microalbumin/Creatinine Ratio, Urine  . CBC    [place]  . Hemoglobin A1c    [place]  . Basic metabolic panel    [place]  . LDL cholesterol, direct    [place]

## 2015-01-14 ENCOUNTER — Telehealth: Payer: Self-pay | Admitting: Family Medicine

## 2015-01-14 NOTE — Telephone Encounter (Signed)
PA was approved. 

## 2015-01-14 NOTE — Telephone Encounter (Signed)
PA for zolpidem was submitted. Ref# [id number]

## 2015-01-25 ENCOUNTER — Other Ambulatory Visit: Payer: Self-pay | Admitting: Family Medicine

## 2015-04-21 ENCOUNTER — Other Ambulatory Visit: Payer: Self-pay | Admitting: Family Medicine

## 2015-04-21 NOTE — Telephone Encounter (Signed)
Yes thanks [place]

## 2015-04-21 NOTE — Telephone Encounter (Signed)
Medication called in 

## 2015-04-21 NOTE — Telephone Encounter (Signed)
Ok to fill 

## 2015-05-14 ENCOUNTER — Other Ambulatory Visit: Payer: Self-pay | Admitting: Family Medicine

## 2015-07-09 ENCOUNTER — Other Ambulatory Visit: Payer: Self-pay | Admitting: Family Medicine

## 2015-07-11 ENCOUNTER — Ambulatory Visit (INDEPENDENT_AMBULATORY_CARE_PROVIDER_SITE_OTHER): Payer: Medicare Other | Admitting: Family Medicine

## 2015-07-11 ENCOUNTER — Encounter: Payer: Self-pay | Admitting: Family Medicine

## 2015-07-11 VITALS — BP 132/70 | HR 67 | Temp 98.2°F | Wt 139.0 lb

## 2015-07-11 DIAGNOSIS — N182 Chronic kidney disease, stage 2 (mild): Secondary | ICD-10-CM

## 2015-07-11 DIAGNOSIS — I10 Essential (primary) hypertension: Secondary | ICD-10-CM

## 2015-07-11 DIAGNOSIS — E785 Hyperlipidemia, unspecified: Secondary | ICD-10-CM

## 2015-07-11 DIAGNOSIS — I491 Atrial premature depolarization: Secondary | ICD-10-CM | POA: Insufficient documentation

## 2015-07-11 DIAGNOSIS — E11319 Type 2 diabetes mellitus with unspecified diabetic retinopathy without macular edema: Secondary | ICD-10-CM | POA: Insufficient documentation

## 2015-07-11 DIAGNOSIS — Z23 Encounter for immunization: Secondary | ICD-10-CM | POA: Diagnosis not present

## 2015-07-11 LAB — BASIC METABOLIC PANEL
BUN: 24 mg/dL — AB (ref 6–23)
CO2: 28 mEq/L (ref 19–32)
Calcium: 10.2 mg/dL (ref 8.4–10.5)
Chloride: 103 mEq/L (ref 96–112)
Creatinine, Ser: 1.04 mg/dL (ref 0.40–1.20)
GFR: 53.89 mL/min — AB (ref >60.00)
Glucose, Bld: 119 mg/dL — ABNORMAL HIGH (ref 70–99)
Potassium: 5.2 mEq/L — ABNORMAL HIGH (ref 3.5–5.1)
SODIUM: 139 meq/L (ref 135–145)

## 2015-07-11 LAB — HEMOGLOBIN A1C: Hgb A1c MFr Bld: 6.4 % (ref 4.6–6.5)

## 2015-07-11 NOTE — Assessment & Plan Note (Signed)
S: controlled. On Simvastatin 40mg  Lab Results  Component Value Date   CHOL 147 12/03/2013   HDL 64.20 12/03/2013   LDLCALC 63 12/03/2013   LDLDIRECT 61.0 01/03/2015   TRIG 99.0 12/03/2013   CHOLHDL 2 12/03/2013  A/P:Continue current meds, tolerating without difficulty

## 2015-07-11 NOTE — Assessment & Plan Note (Signed)
S: controlled. On Amlodipine 5mg  BP Readings from Last 3 Encounters:  07/11/15 132/70  01/03/15 120/80  11/01/14 132/72  A/P:Continue current meds

## 2015-07-11 NOTE — Assessment & Plan Note (Signed)
S: controlled. On metformin 850mg  BID. Fasting CBGs <115.  Lab Results  Component Value Date   HGBA1C 6.4 07/11/2015  A/P:Continue current meds:. 6 month recheck reasonable

## 2015-07-11 NOTE — Progress Notes (Signed)
Garret Reddish, MD  Subjective:  Laura Salas is a 81 y.o. year old very pleasant female patient who presents for/with See problem oriented charting ROS- no chest pain, shortness of breath. No palpitations. No myalgias. No hypoglycemia. Decreased appetite over summer with a "summer cold" now improved, occasional joint pain controlled by tylenol  Past Medical History-  Patient Active Problem List   Diagnosis Date Noted  . Diabetes Type II with retinopathy 05/15/2007    Priority: High  . PAC (premature atrial contraction) 07/11/2015    Priority: Medium  . CKD (chronic kidney disease), stage II 07/05/2014    Priority: Medium  . Insomnia 06/14/2008    Priority: Medium  . Hyperlipidemia 05/15/2007    Priority: Medium  . Essential hypertension 05/15/2007    Priority: Medium  . Osteoporosis 12/12/2009    Priority: Low  . INTENTION TREMOR 10/11/2008    Priority: Low  . DEPRESSION 06/14/2008    Priority: Low  . ANEMIA, OTHER UNSPEC 01/03/2008    Priority: Low    Medications- reviewed and updated Current Outpatient Prescriptions  Medication Sig Dispense Refill  . amLODipine (NORVASC) 5 MG tablet TAKE 1 TABLET BY MOUTH EVERY DAY. 30 tablet 4  . aspirin 325 MG tablet Take 325 mg by mouth daily.      . metFORMIN (GLUCOPHAGE) 850 MG tablet TAKE 1 TABLET BY MOUTH TWICE DAILY. 60 tablet 5  . simvastatin (ZOCOR) 40 MG tablet TAKE 1T BY MOUTH AT BEDTIME. 90 tablet 3  . zolpidem (AMBIEN) 5 MG tablet TAKE 1 TABLET BY MOUTH AT BEDTIME AS NEEDED FOR SLEEP 30 tablet 3   No current facility-administered medications for this visit.    Objective: BP 132/70 mmHg  Pulse 67  Temp(Src) 98.2 F (36.8 C)  Wt 139 lb (63.05 kg) Gen: NAD, resting comfortably CV: RRR with occasional ectopic beat no murmurs rubs or gallops Lungs: CTAB no crackles, wheeze, rhonchi Abdomen: soft/nontender/nondistended/normal bowel sounds. No rebound or guarding.  Ext: trace edema Skin: warm, dry Neuro: grossly  normal, moves all extremities  Assessment/Plan:  Diabetes Type II with retinopathy S: controlled. On metformin 850mg  BID. Fasting CBGs <115.  Lab Results  Component Value Date   HGBA1C 6.4 07/11/2015  A/P:Continue current meds:. 6 month recheck reasonable   Essential hypertension S: controlled. On Amlodipine 5mg  BP Readings from Last 3 Encounters:  07/11/15 132/70  01/03/15 120/80  11/01/14 132/72  A/P:Continue current meds   Hyperlipidemia S: controlled. On Simvastatin 40mg  Lab Results  Component Value Date   CHOL 147 12/03/2013   HDL 64.20 12/03/2013   LDLCALC 63 12/03/2013   LDLDIRECT 61.0 01/03/2015   TRIG 99.0 12/03/2013   CHOLHDL 2 12/03/2013  A/P:Continue current meds, tolerating without difficulty  6 months AWV  Orders Placed This Encounter  Procedures  . Flu Vaccine QUAD 36+ mos IM  . Hemoglobin A1c  . Basic metabolic panel    [place]

## 2015-07-11 NOTE — Patient Instructions (Addendum)
Medication Instructions:  No changes  Other Instructions:  Keep up the walking- think 15 minutes a day is a good goal. Can do it twice a day if you feel up to it  Labwork: Kidney and a1c check before you go  Testing/Procedures/Immunizations: Flu shot received today. Health Maintenance Due  Topic Date Due  . HEMOGLOBIN A1C -today 07/06/2015   Follow-Up (all visit scheduling, rescheduling, cancellations including labs should be scheduled at front desk): 6 months for annual wellness visit (try to get here 10 minutes early as there is some paperwork to fill out).   Sooner if you need [place] or if you have new or worsening symptoms

## 2015-07-21 ENCOUNTER — Other Ambulatory Visit: Payer: Self-pay | Admitting: Family Medicine

## 2015-08-19 ENCOUNTER — Other Ambulatory Visit: Payer: Self-pay | Admitting: Family Medicine

## 2015-08-19 NOTE — Telephone Encounter (Signed)
Yes thanks 

## 2015-08-19 NOTE — Telephone Encounter (Signed)
Refill ok? 

## 2015-08-29 ENCOUNTER — Ambulatory Visit (INDEPENDENT_AMBULATORY_CARE_PROVIDER_SITE_OTHER): Payer: Medicare Other | Admitting: Ophthalmology

## 2015-08-29 DIAGNOSIS — E113592 Type 2 diabetes mellitus with proliferative diabetic retinopathy without macular edema, left eye: Secondary | ICD-10-CM

## 2015-08-29 DIAGNOSIS — E113511 Type 2 diabetes mellitus with proliferative diabetic retinopathy with macular edema, right eye: Secondary | ICD-10-CM

## 2015-08-29 DIAGNOSIS — H353134 Nonexudative age-related macular degeneration, bilateral, advanced atrophic with subfoveal involvement: Secondary | ICD-10-CM | POA: Diagnosis not present

## 2015-08-29 DIAGNOSIS — I10 Essential (primary) hypertension: Secondary | ICD-10-CM

## 2015-08-29 DIAGNOSIS — E11311 Type 2 diabetes mellitus with unspecified diabetic retinopathy with macular edema: Secondary | ICD-10-CM

## 2015-08-29 DIAGNOSIS — H43813 Vitreous degeneration, bilateral: Secondary | ICD-10-CM

## 2015-08-29 LAB — HM DIABETES EYE EXAM

## 2015-11-06 ENCOUNTER — Other Ambulatory Visit: Payer: Self-pay | Admitting: Family Medicine

## 2015-12-25 ENCOUNTER — Telehealth: Payer: Self-pay | Admitting: Family Medicine

## 2015-12-25 NOTE — Telephone Encounter (Signed)
The March 17 visit should be AWV not CPE per Dr. Yong Channel note at last [place]. We can mail pt the AWV questionaire just make sure pt brings it in when she comes to her AWV on March 17th.

## 2015-12-25 NOTE — Telephone Encounter (Signed)
Pt states on her last AVS, it states to make a wellness exam, and to come early to fill out paperwork. Pt would like to know if there is paperwork, could we mail that to her? Pt states she has a vision impairment and needs help filling out.  Also, pt is scheduled for a cpe, do we need to change to a wellness visit only?

## 2015-12-31 NOTE — Telephone Encounter (Addendum)
Pt aware this is Medicare wellness. Thank you.

## 2016-01-09 ENCOUNTER — Ambulatory Visit (INDEPENDENT_AMBULATORY_CARE_PROVIDER_SITE_OTHER): Payer: Medicare Other | Admitting: Family Medicine

## 2016-01-09 ENCOUNTER — Encounter: Payer: Self-pay | Admitting: Family Medicine

## 2016-01-09 VITALS — BP 132/70 | HR 69 | Temp 98.0°F | Resp 18 | Ht 61.0 in | Wt 136.0 lb

## 2016-01-09 DIAGNOSIS — G4700 Insomnia, unspecified: Secondary | ICD-10-CM

## 2016-01-09 DIAGNOSIS — Z0000 Encounter for general adult medical examination without abnormal findings: Secondary | ICD-10-CM | POA: Diagnosis not present

## 2016-01-09 DIAGNOSIS — E11319 Type 2 diabetes mellitus with unspecified diabetic retinopathy without macular edema: Secondary | ICD-10-CM | POA: Diagnosis not present

## 2016-01-09 DIAGNOSIS — I10 Essential (primary) hypertension: Secondary | ICD-10-CM

## 2016-01-09 DIAGNOSIS — E785 Hyperlipidemia, unspecified: Secondary | ICD-10-CM | POA: Diagnosis not present

## 2016-01-09 LAB — LIPID PANEL
CHOLESTEROL: 139 mg/dL (ref 0–200)
HDL: 63.4 mg/dL (ref >39.00)
LDL Cholesterol: 60 mg/dL (ref 0–99)
NonHDL: 75.96
Total CHOL/HDL Ratio: 2
Triglycerides: 80 mg/dL (ref 0.0–149.0)
VLDL: 16 mg/dL (ref 0.0–40.0)

## 2016-01-09 LAB — MICROALBUMIN / CREATININE URINE RATIO
CREATININE, U: 135.9 mg/dL
MICROALB/CREAT RATIO: 2.6 mg/g (ref 0.0–30.0)
Microalb, Ur: 3.5 mg/dL — ABNORMAL HIGH (ref 0.0–1.9)

## 2016-01-09 LAB — CBC
HEMATOCRIT: 36.3 % (ref 36.0–46.0)
HEMOGLOBIN: 12.1 g/dL (ref 12.0–15.0)
MCHC: 33.3 g/dL (ref 30.0–36.0)
MCV: 88.5 fl (ref 78.0–100.0)
PLATELETS: 267 10*3/uL (ref 150.0–400.0)
RBC: 4.1 Mil/uL (ref 3.87–5.11)
RDW: 14.6 % (ref 11.5–15.5)
WBC: 6.9 10*3/uL (ref 4.0–10.5)

## 2016-01-09 LAB — COMPREHENSIVE METABOLIC PANEL
ALBUMIN: 4.3 g/dL (ref 3.5–5.2)
ALK PHOS: 68 U/L (ref 39–117)
ALT: 8 U/L (ref 0–35)
AST: 14 U/L (ref 0–37)
BUN: 18 mg/dL (ref 6–23)
CALCIUM: 9.6 mg/dL (ref 8.4–10.5)
CO2: 28 mEq/L (ref 19–32)
Chloride: 103 mEq/L (ref 96–112)
Creatinine, Ser: 0.91 mg/dL (ref 0.40–1.20)
GFR: 62.79 mL/min (ref >60.00)
Glucose, Bld: 117 mg/dL — ABNORMAL HIGH (ref 70–99)
POTASSIUM: 4.3 meq/L (ref 3.5–5.1)
Sodium: 140 mEq/L (ref 135–145)
TOTAL PROTEIN: 7.2 g/dL (ref 6.0–8.3)
Total Bilirubin: 0.6 mg/dL (ref 0.2–1.2)

## 2016-01-09 LAB — HEMOGLOBIN A1C: Hgb A1c MFr Bld: 6.5 % (ref 4.6–6.5)

## 2016-01-09 NOTE — Patient Instructions (Addendum)
  Ms. Rabalais , Thank you for taking time to come for your Medicare Wellness Visit. I appreciate your ongoing commitment to your health goals. Please review the following plan we discussed and let me know if I can assist you in the future.   These are the goals we discussed: 1. Sign release of information at the check out desk for records from your eye doctor who did your diabetes eye exam. Can also give them our card.  2. Continue your great job walking.  3. Labs before you leave    This is a list of the screening recommended for you and due dates:  Health Maintenance  Topic Date Due  . Eye exam for diabetics  08/09/2015  . Complete foot exam   01/03/2016  . Urine Protein Check  01/03/2016  . Hemoglobin A1C  01/08/2016  . Flu Shot  05/25/2016  . Tetanus Vaccine  04/17/2020  . DEXA scan (bone density measurement)  Completed  . Shingles Vaccine  Completed  . Pneumonia vaccines  Completed

## 2016-01-09 NOTE — Assessment & Plan Note (Signed)
Insomnia controlled on ambien. Willing to refill- no fall risk detected. No issues when gets up at night

## 2016-01-09 NOTE — Progress Notes (Signed)
Pre visit review using our clinic review tool, if applicable. No additional management support is needed unless otherwise documented below in the visit note. 

## 2016-01-09 NOTE — Assessment & Plan Note (Signed)
S: well controlled. On metformin 850mg  BID CBGs- 101 this morning, pretty typical for her. Ranges 88-105  Lab Results  Component Value Date   HGBA1C 6.4 07/11/2015   HGBA1C 6.9* 01/03/2015   HGBA1C 6.7* 07/05/2014   A/P: update a1c today.suspect well controlled

## 2016-01-09 NOTE — Progress Notes (Signed)
Laura Reddish, MD Phone: [phone]  Subjective:  Patient presents today for their annual wellness visit.    Preventive Screening-Counseling & Management  Smoking Status: Never Smoker Second Hand Smoking status: No smokers in home  Risk Factors Regular exercise: walk 4x a week for 30 minutes.  Diet: reasonable  Fall Risk: None   Cardiac risk factors:  advanced age (older than 46 for men, 19 for women)  Hyperlipidemia yes on simvastatin diabetes.  Lab Results  Component Value Date   HGBA1C 6.4 07/11/2015  well controlled  Depression Screen None. PHQ2 0   Activities of Daily Living Independent ADLs and IADLs   Hearing Difficulties: -patient declines  Cognitive Testing No reported trouble.   Normal 3 word recall   List the Names of Other Physician/Practitioners you currently use: -Dr.  Zigmund Daniel ([facility]) and Dr. Patrice Paradise optho -Dr. Melony Overly podiatry - Dr. Netta Cedars orthopedics - Dr. Renda Rolls dermatology  Immunization History  Administered Date(s) Administered  . H1N1 10/11/2008  . Influenza Split 08/06/2011, 08/13/2011, 08/11/2012  . Influenza Whole 08/25/2007, 08/09/2008, 08/01/2009, 07/24/2010  . Influenza,inj,Quad PF,36+ Mos 06/29/2013, 07/05/2014, 07/11/2015  . Pneumococcal Conjugate-13 01/03/2015  . Pneumococcal Polysaccharide-23 11/10/2007  . Td 04/17/2010  . Zoster 04/17/2010   Required Immunizations needed today : None  Screening tests- needed below Health Maintenance Due  Topic Date Due  . OPHTHALMOLOGY EXAM - advised ROI 08/09/2015  . FOOT EXAM - today  01/03/2016  . URINE MICROALBUMIN-today  01/03/2016  . HEMOGLOBIN A1C - today 01/08/2016   ROS- No pertinent positives discovered in course of AWV Pertinent ros- no hypoglycemia. No chest pain or shortness of breath. No headache or blurry vision.   The following were reviewed and entered/updated in epic: Past Medical History  Diagnosis Date  . Diabetes mellitus   .  Hyperlipidemia   . Hypertension   . Osteoporosis   . Depression    Patient Active Problem List   Diagnosis Date Noted  . Diabetes Type II with retinopathy 05/15/2007    Priority: High  . PAC (premature atrial contraction) 07/11/2015    Priority: Medium  . CKD (chronic kidney disease), stage II 07/05/2014    Priority: Medium  . Insomnia 06/14/2008    Priority: Medium  . Hyperlipidemia 05/15/2007    Priority: Medium  . Essential hypertension 05/15/2007    Priority: Medium  . Osteoporosis 12/12/2009    Priority: Low  . INTENTION TREMOR 10/11/2008    Priority: Low  . DEPRESSION 06/14/2008    Priority: Low  . ANEMIA, OTHER UNSPEC 01/03/2008    Priority: Low   Past Surgical History  Procedure Laterality Date  . Cesarean section    . Cataract extraction    . Carpal tunnel release      left    Family History  Problem Relation Age of Onset  . Cancer Mother     colon  . Heart attack Father   . Heart attack Brother   . Cancer Brother     stomach    Medications- reviewed and updated Current Outpatient Prescriptions  Medication Sig Dispense Refill  . amLODipine (NORVASC) 5 MG tablet TAKE 1 TABLET BY MOUTH EVERY DAY 30 tablet 6  . aspirin 325 MG tablet Take 325 mg by mouth daily.      . metFORMIN (GLUCOPHAGE) 850 MG tablet TAKE 1 TABLET BY MOUTH TWICE DAILY 180 tablet 4  . simvastatin (ZOCOR) 40 MG tablet TAKE 1T BY MOUTH AT BEDTIME. 90 tablet 3  . zolpidem (AMBIEN) 5  MG tablet TAKE 1 TABLET BY MOUTH EVERY DAY AT BEDTIME AS NEEDED FOR SLEEP 30 tablet 5   No current facility-administered medications for this visit.    Allergies-reviewed and updated Allergies  Allergen Reactions  . Alendronate Sodium     REACTION: upset stomach, joint and muscle pain  . Codeine Sulfate     REACTION: dizziness, "put her to bed"  . Ibandronate Sodium     REACTION: upset stomach, joint and muscle pain    Social History   Social History  . Marital Status: Widowed    Spouse Name:  N/A  . Number of Children: N/A  . Years of Education: N/A   Social History Main Topics  . Smoking status: Never Smoker   . Smokeless tobacco: Never Used  . Alcohol Use: No  . Drug Use: No  . Sexual Activity: Not Asked   Other Topics Concern  . None   Social History Narrative   Grew up in [place], moved to [place] around 2005. Widowed 10 years ago of "emboli". Only son is a Company secretary at [employer] so moved close for that reason.       Lives in home with son but has separate portion of home. Eats dinner together at time.       Hobbies: shopping, movies, [place] visiting 2 sisters    Objective: BP 132/70 mmHg  Pulse 69  Temp(Src) 98 F (36.7 C) (Oral)  Resp 18  Ht 5\' 1"  (1.549 m)  Wt 136 lb (61.689 kg)  BMI 25.71 kg/m2  SpO2 98% Gen: NAD, resting comfortably HEENT: Mucous membranes are moist. Oropharynx normal Neck: no thyromegaly CV: RRR no murmurs rubs or gallops Lungs: CTAB no crackles, wheeze, rhonchi Abdomen: soft/nontender/nondistended/normal bowel sounds. No rebound or guarding.  Ext: no edema Skin: warm, dry Neuro: grossly normal, moves all extremities, PERRLA  Diabetic Foot Exam - Simple   Simple Foot Form  Diabetic Foot exam was performed with the following findings:  Yes 01/09/2016  9:26 AM  Visual Inspection  No deformities, no ulcerations, no other skin breakdown bilaterally:  Yes  Sensation Testing  Intact to touch and monofilament testing bilaterally:  Yes  Pulse Check  Posterior Tibialis and Dorsalis pulse intact bilaterally:  Yes  Comments     Assessment/Plan:  AWV completed- discussed recommended screenings anddocumented any personalized health advice and referrals for preventive counseling. See AVS as well which was given to patient.   Diabetes Type II with retinopathy S: well controlled. On metformin 850mg  BID CBGs- 101 this morning, pretty typical for her. Ranges 88-105  Lab Results  Component Value Date   HGBA1C 6.4  07/11/2015   HGBA1C 6.9* 01/03/2015   HGBA1C 6.7* 07/05/2014   A/P: update a1c today.suspect well controlled  Hyperlipidemia S: well controlled on simvastatin 40mg . No myalgias.  Lab Results  Component Value Date   CHOL 147 12/03/2013   HDL 64.20 12/03/2013   LDLCALC 63 12/03/2013   LDLDIRECT 61.0 01/03/2015   TRIG 99.0 12/03/2013   CHOLHDL 2 12/03/2013   A/P: continue current medication. Check lipids  Essential hypertension S: controlled. Well on amlodipine 5mg   BP Readings from Last 3 Encounters:  01/09/16 132/70  07/11/15 132/70  01/03/15 120/80  A/P:Continue current medications  Insomnia Insomnia controlled on ambien. Willing to refill- no fall risk detected. No issues when gets up at night    Return in about 6 months (around 07/11/2016) for follow up- or sooner if needed. Return precautions advised.   Orders Placed  This Encounter  Procedures  . CBC    [place]  . Comprehensive metabolic panel    [place]    Order Specific Question:  Has the patient fasted?    Answer:  No  . Lipid panel    [place]    Order Specific Question:  Has the patient fasted?    Answer:  No  . Hemoglobin A1c    [place]  . Microalbumin / creatinine urine ratio    [place]    No orders of the defined types were placed in this encounter.

## 2016-01-09 NOTE — Assessment & Plan Note (Signed)
S: well controlled on simvastatin 40mg . No myalgias.  Lab Results  Component Value Date   CHOL 147 12/03/2013   HDL 64.20 12/03/2013   LDLCALC 63 12/03/2013   LDLDIRECT 61.0 01/03/2015   TRIG 99.0 12/03/2013   CHOLHDL 2 12/03/2013   A/P: continue current medication. Check lipids

## 2016-01-09 NOTE — Assessment & Plan Note (Signed)
S: controlled. Well on amlodipine 5mg   BP Readings from Last 3 Encounters:  01/09/16 132/70  07/11/15 132/70  01/03/15 120/80  A/P:Continue current medications

## 2016-02-09 ENCOUNTER — Other Ambulatory Visit: Payer: Self-pay | Admitting: Family Medicine

## 2016-02-10 NOTE — Telephone Encounter (Signed)
Refill ok? 

## 2016-02-10 NOTE — Telephone Encounter (Signed)
Yes thanks 

## 2016-02-19 ENCOUNTER — Telehealth: Payer: Self-pay | Admitting: Family Medicine

## 2016-02-19 MED ORDER — AMLODIPINE BESYLATE 5 MG PO TABS: 5.0000 mg | ORAL_TABLET | Freq: Every day | ORAL | Status: DC

## 2016-02-19 NOTE — Telephone Encounter (Signed)
Medication refilled

## 2016-02-19 NOTE — Telephone Encounter (Signed)
Pt would like 90 day supply of amlodipine 5 mg send to [facility]. Pt only received 30 day supply

## 2016-07-16 ENCOUNTER — Ambulatory Visit (INDEPENDENT_AMBULATORY_CARE_PROVIDER_SITE_OTHER): Payer: Medicare Other | Admitting: Family Medicine

## 2016-07-16 ENCOUNTER — Encounter: Payer: Self-pay | Admitting: Family Medicine

## 2016-07-16 VITALS — BP 132/78 | HR 89 | Temp 98.6°F | Resp 18 | Ht 61.0 in | Wt 137.2 lb

## 2016-07-16 DIAGNOSIS — I10 Essential (primary) hypertension: Secondary | ICD-10-CM | POA: Diagnosis not present

## 2016-07-16 DIAGNOSIS — Z23 Encounter for immunization: Secondary | ICD-10-CM

## 2016-07-16 DIAGNOSIS — E785 Hyperlipidemia, unspecified: Secondary | ICD-10-CM

## 2016-07-16 DIAGNOSIS — G4700 Insomnia, unspecified: Secondary | ICD-10-CM

## 2016-07-16 DIAGNOSIS — E11319 Type 2 diabetes mellitus with unspecified diabetic retinopathy without macular edema: Secondary | ICD-10-CM | POA: Diagnosis not present

## 2016-07-16 LAB — BASIC METABOLIC PANEL
BUN: 18 mg/dL (ref 6–23)
CO2: 29 meq/L (ref 19–32)
Calcium: 9.5 mg/dL (ref 8.4–10.5)
Chloride: 105 mEq/L (ref 96–112)
Creatinine, Ser: 0.88 mg/dL (ref 0.40–1.20)
GFR: 65.18 mL/min (ref >60.00)
GLUCOSE: 120 mg/dL — AB (ref 70–99)
POTASSIUM: 5.2 meq/L — AB (ref 3.5–5.1)
Sodium: 141 mEq/L (ref 135–145)

## 2016-07-16 LAB — HEMOGLOBIN A1C: Hgb A1c MFr Bld: 6.6 % — ABNORMAL HIGH (ref 4.6–6.5)

## 2016-07-16 MED ORDER — METFORMIN HCL 850 MG PO TABS: 850.0000 mg | ORAL_TABLET | Freq: Two times a day (BID) | ORAL | 3 refills | Status: DC

## 2016-07-16 MED ORDER — SIMVASTATIN 40 MG PO TABS: ORAL_TABLET | ORAL | 3 refills | Status: DC

## 2016-07-16 MED ORDER — ZOLPIDEM TARTRATE ER 6.25 MG PO TBCR: 6.2500 mg | EXTENDED_RELEASE_TABLET | Freq: Every evening | ORAL | 5 refills | Status: DC | PRN

## 2016-07-16 MED ORDER — AMLODIPINE BESYLATE 5 MG PO TABS: 5.0000 mg | ORAL_TABLET | Freq: Every day | ORAL | 3 refills | Status: DC

## 2016-07-16 NOTE — Assessment & Plan Note (Signed)
Following with Dr. Zigmund Daniel- need updated records- request today. Continue a1c gola <&

## 2016-07-16 NOTE — Progress Notes (Signed)
Pre visit review using our clinic review tool, if applicable. No additional management support is needed unless otherwise documented below in the visit note. 

## 2016-07-16 NOTE — Assessment & Plan Note (Signed)
S: sleeping about 6 hours with ambien 5 mg- struggling with sleep maintainence A/P: trial extended release ambien instead of short acting. Warned to watch out for how she is feeling in morning before driving and stop immediately if any falls. We were concerned about cost and effectiveness of belsomra but is an option

## 2016-07-16 NOTE — Patient Instructions (Addendum)
Sign release of information at the check out desk for last eye exam  Trial extended release ambien. Watch how you are feeling in mornings before driving. If any falls we will have to stop medication  No other changes to medication

## 2016-07-16 NOTE — Addendum Note (Signed)
Addended by: Marin Olp on: 07/16/2016 10:21 AM   Modules accepted: Orders

## 2016-07-16 NOTE — Progress Notes (Signed)
Subjective:  Laura Salas is a 82 y.o. year old very pleasant female patient who presents for/with See problem oriented charting ROS- No chest pain or shortness of breath. No headache or blurry vision. No hypoglycemia.see any ROS included in HPI as well.   Past Medical History-  Patient Active Problem List   Diagnosis Date Noted  . Diabetes Type II with retinopathy 05/15/2007    Priority: High  . Diabetic retinopathy ([place]) 07/11/2015    Priority: Medium  . PAC (premature atrial contraction) 07/11/2015    Priority: Medium  . CKD (chronic kidney disease), stage II 07/05/2014    Priority: Medium  . Insomnia 06/14/2008    Priority: Medium  . Hyperlipidemia 05/15/2007    Priority: Medium  . Essential hypertension 05/15/2007    Priority: Medium  . Osteoporosis 12/12/2009    Priority: Low  . INTENTION TREMOR 10/11/2008    Priority: Low  . DEPRESSION 06/14/2008    Priority: Low  . ANEMIA, OTHER UNSPEC 01/03/2008    Priority: Low    Medications- reviewed and updated Current Outpatient Prescriptions  Medication Sig Dispense Refill  . amLODipine (NORVASC) 5 MG tablet Take 1 tablet (5 mg total) by mouth daily. 90 tablet 3  . aspirin 325 MG tablet Take 325 mg by mouth daily.      . metFORMIN (GLUCOPHAGE) 850 MG tablet Take 1 tablet (850 mg total) by mouth 2 (two) times daily. 180 tablet 3  . simvastatin (ZOCOR) 40 MG tablet TAKE 1 TABLET BY MOUTH AT BEDTIME. 90 tablet 3  . zolpidem (AMBIEN CR) 6.25 MG CR tablet Take 1 tablet (6.25 mg total) by mouth at bedtime as needed for sleep. 30 tablet 5   No current facility-administered medications for this visit.     Objective: BP 132/78 (BP Location: Left Arm, Patient Position: Sitting, Cuff Size: Normal)   Pulse 89   Temp 98.6 F (37 C) (Oral)   Resp 18   Ht 5\' 1"  (1.549 m)   Wt 137 lb 4 oz (62.3 kg)   SpO2 98%   BMI 25.93 kg/m  Gen: NAD, resting comfortably CV: RRR - ectopic beats noted with PAC history- no murmurs rubs or  gallops Lungs: CTAB no crackles, wheeze, rhonchi Abdomen: soft/nontender/nondistended/normal bowel sounds.  Ext: no edema Skin: warm, dry  Assessment/Plan:  Diabetic retinopathy ([place]) Following with Dr. Zigmund Daniel- need updated records- request today. Continue a1c gola <&  Insomnia S: sleeping about 6 hours with ambien 5 mg- struggling with sleep maintainence A/P: trial extended release ambien instead of short acting. Warned to watch out for how she is feeling in morning before driving and stop immediately if any falls. We were concerned about cost and effectiveness of belsomra but is an option  DM II with retinopathy S: suspect well controlled. On metformin 850mg  BID CBGs- 109 this am- pretty typical Lab Results  Component Value Date   HGBA1C 6.5 01/09/2016   HGBA1C 6.4 07/11/2015   HGBA1C 6.9 (H) 01/03/2015   A/P: update a1c today  Hyperlipidemia S: well controlled on simvastatin 40mg . No myalgias.  Lab Results  Component Value Date   CHOL 139 01/09/2016   HDL 63.40 01/09/2016   LDLCALC 60 01/09/2016   LDLDIRECT 61.0 01/03/2015   TRIG 80.0 01/09/2016   CHOLHDL 2 01/09/2016   A/P: continue current medications- tolerating well. Noted overlap with simvastatin   Hypertension S: controlled. On amlodipine 5mg  BP Readings from Last 3 Encounters:  07/16/16 132/78  01/09/16 132/70  07/11/15 132/70  A/P:Continue current medications- doing well  High dose flu shot given- will be ordered by Anselmo Pickler, LPN  Meds ordered this encounter  Medications  . amLODipine (NORVASC) 5 MG tablet    Sig: Take 1 tablet (5 mg total) by mouth daily.    Dispense:  90 tablet    Refill:  3  . metFORMIN (GLUCOPHAGE) 850 MG tablet    Sig: Take 1 tablet (850 mg total) by mouth 2 (two) times daily.    Dispense:  180 tablet    Refill:  3  . simvastatin (ZOCOR) 40 MG tablet    Sig: TAKE 1 TABLET BY MOUTH AT BEDTIME.    Dispense:  90 tablet    Refill:  3  . zolpidem (AMBIEN CR) 6.25 MG CR  tablet    Sig: Take 1 tablet (6.25 mg total) by mouth at bedtime as needed for sleep.    Dispense:  30 tablet    Refill:  5    Return precautions advised.  Garret Reddish, MD

## 2016-07-20 ENCOUNTER — Telehealth: Payer: Self-pay | Admitting: Family Medicine

## 2016-07-20 ENCOUNTER — Encounter: Payer: Self-pay | Admitting: Family Medicine

## 2016-07-20 NOTE — Telephone Encounter (Signed)
PA submitted & is pending. Key: [facility]

## 2016-07-20 NOTE — Telephone Encounter (Signed)
Pt needs PA on ambien cr please call [phone]

## 2016-07-21 NOTE — Telephone Encounter (Signed)
She is already on ambien and getting some sleep. Tell her it was denied but I do not really have a strong desire to try the other options unless she really wants to.

## 2016-07-21 NOTE — Telephone Encounter (Signed)
Laura Salas called wanting to verify if pt has tried alternatives rozerem or silenor  Order no:  [phone]

## 2016-07-21 NOTE — Telephone Encounter (Signed)
Patient has to try the preferred alternatives: rozerem or silenor.

## 2016-07-23 NOTE — Telephone Encounter (Signed)
Called and left a voicemail message asking for a return phone call 

## 2016-07-26 ENCOUNTER — Telehealth: Payer: Self-pay | Admitting: Family Medicine

## 2016-07-26 ENCOUNTER — Other Ambulatory Visit: Payer: Self-pay

## 2016-07-26 MED ORDER — ZOLPIDEM TARTRATE ER 6.25 MG PO TBCR: 6.2500 mg | EXTENDED_RELEASE_TABLET | Freq: Every evening | ORAL | 5 refills | Status: DC | PRN

## 2016-07-26 NOTE — Telephone Encounter (Signed)
error 

## 2016-07-26 NOTE — Telephone Encounter (Signed)
Spoke with patient regarding prescriptions and insurance denial. Request that refills be sent to her pharmacy for Zolpidem 5mg . She is also considering making an appointment to come in to discuss. I did offer to make her an appointment and she said she would consider it.

## 2016-08-03 ENCOUNTER — Other Ambulatory Visit: Payer: Self-pay | Admitting: Family Medicine

## 2016-08-06 ENCOUNTER — Encounter: Payer: Self-pay | Admitting: Family Medicine

## 2016-08-06 ENCOUNTER — Ambulatory Visit: Payer: Medicare Other | Admitting: Family Medicine

## 2016-08-06 ENCOUNTER — Ambulatory Visit (INDEPENDENT_AMBULATORY_CARE_PROVIDER_SITE_OTHER): Payer: Medicare Other | Admitting: Family Medicine

## 2016-08-06 DIAGNOSIS — I10 Essential (primary) hypertension: Secondary | ICD-10-CM | POA: Diagnosis not present

## 2016-08-06 DIAGNOSIS — G4700 Insomnia, unspecified: Secondary | ICD-10-CM

## 2016-08-06 MED ORDER — ZOLPIDEM TARTRATE 5 MG PO TABS: ORAL_TABLET | ORAL | 5 refills | Status: DC

## 2016-08-06 NOTE — Patient Instructions (Signed)
Refilled ambien 5mg . I am so sorry for all the issues you have had.

## 2016-08-06 NOTE — Progress Notes (Signed)
Subjective:  Laura Salas is a 82 y.o. year old very pleasant female patient who presents for/with See problem oriented charting ROS- complains of poor sleep for many nights and fatigue associated with this. No chest pain or shortness of breath. No abnormal dreams.see any ROS included in HPI as well.   Past Medical History-  Patient Active Problem List   Diagnosis Date Noted  . Diabetes Type II with retinopathy 05/15/2007    Priority: High  . Diabetic retinopathy ([place]) 07/11/2015    Priority: Medium  . PAC (premature atrial contraction) 07/11/2015    Priority: Medium  . CKD (chronic kidney disease), stage II 07/05/2014    Priority: Medium  . Insomnia 06/14/2008    Priority: Medium  . Hyperlipidemia 05/15/2007    Priority: Medium  . Essential hypertension 05/15/2007    Priority: Medium  . Osteoporosis 12/12/2009    Priority: Low  . INTENTION TREMOR 10/11/2008    Priority: Low  . DEPRESSION 06/14/2008    Priority: Low  . ANEMIA, OTHER UNSPEC 01/03/2008    Priority: Low    Medications- reviewed and updated Current Outpatient Prescriptions  Medication Sig Dispense Refill  . amLODipine (NORVASC) 5 MG tablet Take 1 tablet (5 mg total) by mouth daily. 90 tablet 3  . aspirin 325 MG tablet Take 325 mg by mouth daily.      . metFORMIN (GLUCOPHAGE) 850 MG tablet Take 1 tablet (850 mg total) by mouth 2 (two) times daily. 180 tablet 3  . simvastatin (ZOCOR) 40 MG tablet TAKE 1 TABLET BY MOUTH AT BEDTIME. 90 tablet 3  . zolpidem (AMBIEN) 5 MG tablet TAKE 1 TABLET BY MOUTH EVERY DAY AT BEDTIME AS NEEDED FOR SLEEP 31 tablet 5   Objective: BP (!) 142/72   Pulse (!) 56   Temp 98.4 F (36.9 C) (Oral)   Wt 137 lb 6.4 oz (62.3 kg)   SpO2 98%   BMI 25.96 kg/m  Gen: NAD, resting comfortably Assessment/Plan:  Insomnia S: Paitent very frustrated. Her ambien XR 6.25mg  was denied and she had to try silenor or rozerem. I sent phone note that would prefer just to go back to ambien 5mg  as  she gets some sleep on this but did not explain well thought process of my concern for risks of rozerem or not having counseled her on silenor yet in person- the best option at that point would have been to call patient directly instead of sending phone message. I apologized for that issue. She then called back and stated wanted 5mg  about 10 days ago but this was not sent in by staff as intended.  A/P: Extended counseling today to patient given her frustrations over past month. She at least gets some sleep on the 5mg  ambien and in the end she opted to go back to this instead of trialing the rozerem or silenor. Also had to counsel on reasoning for insurance not approving medicine initially. She has had many almost sleepless nights- hopeful this can be resolved tonight with new printed rx handed to her. If not- asked her to come see me immediately in office and I would work her in to talk with her   Essential hypertension Bp at home was 120/80 this AM. Elevated today at 142/72 likely due to stress/frustration over medication issue- controlled in past- will trend and continue amlodipine 5mg  alone for now  March visit planned  Meds ordered this encounter  Medications  . zolpidem (AMBIEN) 5 MG tablet    Sig:  TAKE 1 TABLET BY MOUTH EVERY DAY AT BEDTIME AS NEEDED FOR SLEEP    Dispense:  31 tablet    Refill:  5   The duration of face-to-face time during this visit was greater than 15 minutes. Greater than 50% of this time was spent in counseling as noted above  Return precautions advised.  Garret Reddish, MD

## 2016-08-06 NOTE — Assessment & Plan Note (Signed)
S: Paitent very frustrated. Her ambien XR 6.25mg  was denied and she had to try silenor or rozerem. I sent phone note that would prefer just to go back to ambien 5mg  as she gets some sleep on this but did not explain well thought process of my concern for risks of rozerem or not having counseled her on silenor yet in person- the best option at that point would have been to call patient directly instead of sending phone message. I apologized for that issue. She then called back and stated wanted 5mg  about 10 days ago but this was not sent in by staff as intended.  A/P: Extended counseling today to patient given her frustrations over past month. She at least gets some sleep on the 5mg  ambien and in the end she opted to go back to this instead of trialing the rozerem or silenor. Also had to counsel on reasoning for insurance not approving medicine initially. She has had many almost sleepless nights- hopeful this can be resolved tonight with new printed rx handed to her. If not- asked her to come see me immediately in office and I would work her in to talk with her

## 2016-08-06 NOTE — Assessment & Plan Note (Signed)
Bp at home was 120/80 this AM. Elevated today at 142/72 likely due to stress/frustration over medication issue- controlled in past- will trend and continue amlodipine 5mg  alone for now

## 2016-08-06 NOTE — Progress Notes (Signed)
Pre visit review using our clinic review tool, if applicable. No additional management support is needed unless otherwise documented below in the visit note. 

## 2016-09-10 ENCOUNTER — Ambulatory Visit (INDEPENDENT_AMBULATORY_CARE_PROVIDER_SITE_OTHER): Payer: Medicare Other | Admitting: Ophthalmology

## 2016-09-10 DIAGNOSIS — E113593 Type 2 diabetes mellitus with proliferative diabetic retinopathy without macular edema, bilateral: Secondary | ICD-10-CM

## 2016-09-10 DIAGNOSIS — I10 Essential (primary) hypertension: Secondary | ICD-10-CM

## 2016-09-10 DIAGNOSIS — H35033 Hypertensive retinopathy, bilateral: Secondary | ICD-10-CM

## 2016-09-10 DIAGNOSIS — H353134 Nonexudative age-related macular degeneration, bilateral, advanced atrophic with subfoveal involvement: Secondary | ICD-10-CM | POA: Diagnosis not present

## 2016-09-10 DIAGNOSIS — H43813 Vitreous degeneration, bilateral: Secondary | ICD-10-CM

## 2016-09-10 DIAGNOSIS — E11319 Type 2 diabetes mellitus with unspecified diabetic retinopathy without macular edema: Secondary | ICD-10-CM

## 2016-09-10 LAB — HM DIABETES EYE EXAM

## 2016-10-26 ENCOUNTER — Telehealth: Payer: Self-pay | Admitting: Family Medicine

## 2016-10-26 NOTE — Telephone Encounter (Signed)
[employer] called with pt on a 3 way call. Pt would like to change her Rx to  [facility] .  they would like a copy of her current med list faxed to them  Fax: [phone]

## 2016-10-28 NOTE — Telephone Encounter (Signed)
Faxed as requested

## 2016-11-03 ENCOUNTER — Telehealth: Payer: Self-pay | Admitting: Family Medicine

## 2016-11-03 NOTE — Telephone Encounter (Signed)
Pt needs new rxs one touch glucometer, testing strips and lancets enoughfor 90 day supplyw/refills . Pt is checking  her bs once a day. Fax to [facility] patient care solution [phone]

## 2016-11-04 ENCOUNTER — Telehealth: Payer: Self-pay | Admitting: Family Medicine

## 2016-11-04 ENCOUNTER — Other Ambulatory Visit: Payer: Self-pay

## 2016-11-04 MED ORDER — GLUCOSE BLOOD VI STRP: ORAL_STRIP | 12 refills | Status: DC

## 2016-11-04 MED ORDER — BLOOD GLUCOSE MONITOR KIT: PACK | 0 refills | Status: DC

## 2016-11-04 MED ORDER — ONETOUCH ULTRASOFT LANCETS MISC: 12 refills | Status: DC

## 2016-11-04 NOTE — Telephone Encounter (Signed)
Faxed as requested

## 2016-11-04 NOTE — Telephone Encounter (Signed)
Laura Salas need verbal order for Diabetic supply orders that pt has place.

## 2016-11-05 NOTE — Telephone Encounter (Signed)
Prescriptions for DM were faxed yesterday 11/04/2016 to [place].  [facility] was called yesterday and order was cancelled.

## 2016-11-05 NOTE — Telephone Encounter (Signed)
Pt called to confirm she DOES want her DM sent to  [facility] order.  Pt is getting a new meter, test strips, lancets everything.  In addition, pt has changed insurance companies Oct 25, 2016. Her primary insurance for all other Rx will be  [facility] [address].  Please make this change  [employer]  [facility]  [phone]  Please cancel [facility]

## 2016-11-05 NOTE — Telephone Encounter (Signed)
[facility] called for verbal again for DM supplies. Advise them supplies were sent to another pharmacy.  If pt wants them to get the order, pt will need to call our office and we will resend to them.  Pt is a new pt with [employer].

## 2016-11-08 DIAGNOSIS — R69 Illness, unspecified: Secondary | ICD-10-CM | POA: Diagnosis not present

## 2016-11-12 ENCOUNTER — Telehealth: Payer: Self-pay | Admitting: Family Medicine

## 2016-11-12 NOTE — Telephone Encounter (Signed)
Prescriptions faxed to new number

## 2016-11-12 NOTE — Telephone Encounter (Signed)
° ° ° ° °  Pt request refill of the following:  blood glucose meter kit and supplies KIT   The above rx was sent to the wrong number need to be sent to the below number   Phamacy:[facility]  Phone number  [phone]   fax number [phone]

## 2016-11-12 NOTE — Telephone Encounter (Signed)
° ° ° °  Correct fax number [phone]

## 2016-11-13 DIAGNOSIS — R69 Illness, unspecified: Secondary | ICD-10-CM | POA: Diagnosis not present

## 2016-11-16 DIAGNOSIS — R69 Illness, unspecified: Secondary | ICD-10-CM | POA: Diagnosis not present

## 2016-11-25 ENCOUNTER — Telehealth: Payer: Self-pay

## 2016-11-25 NOTE — Telephone Encounter (Signed)
Received PA form for Zolpidem from insurance company. Form filled out & faxed back to pharmacy.

## 2016-11-26 NOTE — Telephone Encounter (Signed)
PA approved, form faxed back to pharmacy. 

## 2016-12-03 ENCOUNTER — Telehealth: Payer: Self-pay

## 2016-12-03 DIAGNOSIS — E1151 Type 2 diabetes mellitus with diabetic peripheral angiopathy without gangrene: Secondary | ICD-10-CM | POA: Diagnosis not present

## 2016-12-03 DIAGNOSIS — L602 Onychogryphosis: Secondary | ICD-10-CM | POA: Diagnosis not present

## 2016-12-03 NOTE — Telephone Encounter (Signed)
Received fax from insurance company that Ambien 5 mg would no longer be covered. PA started & submitted. Key: [id number]

## 2017-01-14 ENCOUNTER — Encounter: Payer: Self-pay | Admitting: Family Medicine

## 2017-01-14 ENCOUNTER — Ambulatory Visit (INDEPENDENT_AMBULATORY_CARE_PROVIDER_SITE_OTHER): Payer: Medicare HMO | Admitting: Family Medicine

## 2017-01-14 VITALS — BP 136/76 | HR 100 | Temp 97.8°F | Ht 61.0 in | Wt 140.0 lb

## 2017-01-14 DIAGNOSIS — E11319 Type 2 diabetes mellitus with unspecified diabetic retinopathy without macular edema: Secondary | ICD-10-CM | POA: Diagnosis not present

## 2017-01-14 DIAGNOSIS — E785 Hyperlipidemia, unspecified: Secondary | ICD-10-CM

## 2017-01-14 DIAGNOSIS — I10 Essential (primary) hypertension: Secondary | ICD-10-CM

## 2017-01-14 LAB — LIPID PANEL
CHOL/HDL RATIO: 2
CHOLESTEROL: 139 mg/dL (ref 0–200)
HDL: 63.1 mg/dL (ref >39.00)
LDL CALC: 60 mg/dL (ref 0–99)
NONHDL: 75.64
Triglycerides: 79 mg/dL (ref 0.0–149.0)
VLDL: 15.8 mg/dL (ref 0.0–40.0)

## 2017-01-14 LAB — COMPREHENSIVE METABOLIC PANEL
ALBUMIN: 4.2 g/dL (ref 3.5–5.2)
ALT: 10 U/L (ref 0–35)
AST: 14 U/L (ref 0–37)
Alkaline Phosphatase: 67 U/L (ref 39–117)
BUN: 20 mg/dL (ref 6–23)
CALCIUM: 9.6 mg/dL (ref 8.4–10.5)
CHLORIDE: 103 meq/L (ref 96–112)
CO2: 28 mEq/L (ref 19–32)
CREATININE: 0.87 mg/dL (ref 0.40–1.20)
GFR: 65.97 mL/min (ref >60.00)
Glucose, Bld: 127 mg/dL — ABNORMAL HIGH (ref 70–99)
Potassium: 4.7 mEq/L (ref 3.5–5.1)
Sodium: 140 mEq/L (ref 135–145)
Total Bilirubin: 0.6 mg/dL (ref 0.2–1.2)
Total Protein: 6.7 g/dL (ref 6.0–8.3)

## 2017-01-14 LAB — CBC
HCT: 35.4 % — ABNORMAL LOW (ref 36.0–46.0)
HEMOGLOBIN: 11.6 g/dL — AB (ref 12.0–15.0)
MCHC: 32.6 g/dL (ref 30.0–36.0)
MCV: 91.1 fl (ref 78.0–100.0)
PLATELETS: 259 10*3/uL (ref 150.0–400.0)
RBC: 3.89 Mil/uL (ref 3.87–5.11)
RDW: 14.5 % (ref 11.5–15.5)
WBC: 7.2 10*3/uL (ref 4.0–10.5)

## 2017-01-14 LAB — POCT UA - MICROALBUMIN
CREATININE, POC: 100 mg/dL
MICROALBUMIN (UR) POC: 30 mg/L

## 2017-01-14 LAB — HEMOGLOBIN A1C: Hgb A1c MFr Bld: 6.8 % — ABNORMAL HIGH (ref 4.6–6.5)

## 2017-01-14 MED ORDER — SIMVASTATIN 40 MG PO TABS: ORAL_TABLET | ORAL | 3 refills | Status: DC

## 2017-01-14 MED ORDER — ZOLPIDEM TARTRATE 5 MG PO TABS: ORAL_TABLET | ORAL | 5 refills | Status: DC

## 2017-01-14 MED ORDER — METFORMIN HCL 850 MG PO TABS: 850.0000 mg | ORAL_TABLET | Freq: Two times a day (BID) | ORAL | 3 refills | Status: DC

## 2017-01-14 MED ORDER — AMLODIPINE BESYLATE 5 MG PO TABS: 5.0000 mg | ORAL_TABLET | Freq: Every day | ORAL | 3 refills | Status: DC

## 2017-01-14 NOTE — Assessment & Plan Note (Signed)
Diabetic retinopathy S: previously controlled. On metformin 850mg  BID. Followed by Dr. Zigmund Daniel who is her retina specialist. Things have been stable. Need to keep tight control Lab Results  Component Value Date   HGBA1C 6.6 (H) 07/16/2016   HGBA1C 6.5 01/09/2016   HGBA1C 6.4 07/11/2015   A/P: update a1c today. Glad eyes have been stable.

## 2017-01-14 NOTE — Assessment & Plan Note (Signed)
S: controlled on amlodipine 5mg  at home at 120/68 this morning.  BP Readings from Last 3 Encounters:  01/14/17 136/76  08/06/16 (!) 142/72  07/16/16 132/78  A/P:Continue current meds:  Controlled on repeat

## 2017-01-14 NOTE — Addendum Note (Signed)
Addended by: Marin Olp on: 01/14/2017 10:41 AM   Modules accepted: Orders

## 2017-01-14 NOTE — Assessment & Plan Note (Signed)
S: well controlled on simvastatin 40mg . No myalgias.  Lab Results  Component Value Date   CHOL 139 01/09/2016   HDL 63.40 01/09/2016   LDLCALC 60 01/09/2016   LDLDIRECT 61.0 01/03/2015   TRIG 80.0 01/09/2016   CHOLHDL 2 01/09/2016   A/P: update lipid panel

## 2017-01-14 NOTE — Patient Instructions (Addendum)
Please stop by lab before you go. Blood and urine.   No changes in medicine today  Glad you are doing well!

## 2017-01-14 NOTE — Progress Notes (Signed)
Pre visit review using our clinic review tool, if applicable. No additional management support is needed unless otherwise documented below in the visit note. 

## 2017-01-14 NOTE — Progress Notes (Signed)
Subjective:  Laura Salas is a 83 y.o. year old very pleasant female patient who presents for/with See problem oriented charting ROS- No chest pain or shortness of breath. No headache or blurry vision.  No hypoglycemia   Past Medical History-  Patient Active Problem List   Diagnosis Date Noted  . Diabetes Type II with retinopathy 05/15/2007    Priority: High  . Diabetic retinopathy ([place]) 07/11/2015    Priority: Medium  . PAC (premature atrial contraction) 07/11/2015    Priority: Medium  . CKD (chronic kidney disease), stage II 07/05/2014    Priority: Medium  . Insomnia 06/14/2008    Priority: Medium  . Hyperlipidemia 05/15/2007    Priority: Medium  . Essential hypertension 05/15/2007    Priority: Medium  . Osteoporosis 12/12/2009    Priority: Low  . INTENTION TREMOR 10/11/2008    Priority: Low  . DEPRESSION 06/14/2008    Priority: Low  . ANEMIA, OTHER UNSPEC 01/03/2008    Priority: Low    Medications- reviewed and updated Current Outpatient Prescriptions  Medication Sig Dispense Refill  . amLODipine (NORVASC) 5 MG tablet Take 1 tablet (5 mg total) by mouth daily. 90 tablet 3  . aspirin 325 MG tablet Take 325 mg by mouth daily.      . blood glucose meter kit and supplies KIT Dispense based on patient and insurance preference. Use to check blood sugar daily. One Touch glucometer E11.9 1 each 0  . glucose blood test strip Use as instructed 100 each 12  . Lancets (ONETOUCH ULTRASOFT) lancets Use as instructed 100 each 12  . metFORMIN (GLUCOPHAGE) 850 MG tablet Take 1 tablet (850 mg total) by mouth 2 (two) times daily. 180 tablet 3  . simvastatin (ZOCOR) 40 MG tablet TAKE 1 TABLET BY MOUTH AT BEDTIME. 90 tablet 3  . zolpidem (AMBIEN) 5 MG tablet TAKE 1 TABLET BY MOUTH EVERY DAY AT BEDTIME AS NEEDED FOR SLEEP 31 tablet 5   No current facility-administered medications for this visit.     Objective: BP 136/76   Pulse 100   Temp 97.8 F (36.6 C) (Oral)   Ht 5' 1" (1.549  m)   Wt 140 lb (63.5 kg)   SpO2 98%   BMI 26.45 kg/m  Gen: NAD, resting comfortably, appears stated age CV: RRR with occasional ectopic beat no murmurs rubs or gallops Lungs: CTAB no crackles, wheeze, rhonchi Abdomen: soft/nontender/nondistended/normal bowel sounds.  Ext: no edema Skin: warm, dry Neuro: walks without assistive device Diabetic Foot Exam - Simple   Simple Foot Form Diabetic Foot exam was performed with the following findings:  Yes 01/14/2017  9:25 AM  Visual Inspection No deformities, no ulcerations, no other skin breakdown bilaterally:  Yes Sensation Testing Intact to touch and monofilament testing bilaterally:  Yes Pulse Check Posterior Tibialis and Dorsalis pulse intact bilaterally:  Yes Comments    Assessment/Plan:  Diabetes Type II with retinopathy Diabetic retinopathy S: previously controlled. On metformin 860m BID. Followed by Dr. MZigmund Danielwho is her retina specialist. Things have been stable. Need to keep tight control Lab Results  Component Value Date   HGBA1C 6.6 (H) 07/16/2016   HGBA1C 6.5 01/09/2016   HGBA1C 6.4 07/11/2015   A/P: update a1c today. Glad eyes have been stable.   Hyperlipidemia S: well controlled on simvastatin 473m No myalgias.  Lab Results  Component Value Date   CHOL 139 01/09/2016   HDL 63.40 01/09/2016   LDLCALC 60 01/09/2016   LDLDIRECT 61.0 01/03/2015  TRIG 80.0 01/09/2016   CHOLHDL 2 01/09/2016   A/P: update lipid panel  Essential hypertension S: controlled on amlodipine 5mg at home at 120/68 this morning.  BP Readings from Last 3 Encounters:  01/14/17 136/76  08/06/16 (!) 142/72  07/16/16 132/78  A/P:Continue current meds:  Controlled on repeat   Return in about 6 months (around 07/17/2017) for annual wellness visit with Susan and see me after you see her. .  Orders Placed This Encounter  Procedures  . CBC    [place]  . Comprehensive metabolic panel    [place]    Order Specific Question:   Has the  patient fasted?    Answer:   No  . Lipid panel    [place]    Order Specific Question:   Has the patient fasted?    Answer:   No  . Hemoglobin A1c    [place]  . Microalbumin / creatinine urine ratio    [place]    Meds ordered this encounter  Medications  . metFORMIN (GLUCOPHAGE) 850 MG tablet    Sig: Take 1 tablet (850 mg total) by mouth 2 (two) times daily.    Dispense:  180 tablet    Refill:  3  . zolpidem (AMBIEN) 5 MG tablet    Sig: TAKE 1 TABLET BY MOUTH EVERY DAY AT BEDTIME AS NEEDED FOR SLEEP    Dispense:  31 tablet    Refill:  5  . amLODipine (NORVASC) 5 MG tablet    Sig: Take 1 tablet (5 mg total) by mouth daily.    Dispense:  90 tablet    Refill:  3  . simvastatin (ZOCOR) 40 MG tablet    Sig: TAKE 1 TABLET BY MOUTH AT BEDTIME.    Dispense:  90 tablet    Refill:  3    Return precautions advised.   , MD  

## 2017-01-18 ENCOUNTER — Telehealth: Payer: Self-pay | Admitting: Family Medicine

## 2017-01-18 NOTE — Telephone Encounter (Signed)
Pt would like blood work results °

## 2017-01-18 NOTE — Telephone Encounter (Signed)
Spoke with patient who verbalized understanding.

## 2017-02-02 ENCOUNTER — Other Ambulatory Visit: Payer: Self-pay | Admitting: Family Medicine

## 2017-02-17 ENCOUNTER — Telehealth: Payer: Self-pay | Admitting: Family Medicine

## 2017-02-17 ENCOUNTER — Other Ambulatory Visit: Payer: Self-pay

## 2017-02-17 MED ORDER — ONETOUCH ULTRASOFT LANCETS MISC: 12 refills | Status: DC

## 2017-02-17 MED ORDER — GLUCOSE BLOOD VI STRP: ORAL_STRIP | 12 refills | Status: DC

## 2017-02-17 NOTE — Telephone Encounter (Signed)
Pt needs one touch ultra soft lancets and test strips #90 w/refills send to [place] home delivery. Phone # [phone]

## 2017-02-17 NOTE — Telephone Encounter (Signed)
Prescriptions sent to pharmacy as requested 

## 2017-02-18 DIAGNOSIS — R69 Illness, unspecified: Secondary | ICD-10-CM | POA: Diagnosis not present

## 2017-03-11 DIAGNOSIS — L84 Corns and callosities: Secondary | ICD-10-CM | POA: Diagnosis not present

## 2017-03-11 DIAGNOSIS — E1151 Type 2 diabetes mellitus with diabetic peripheral angiopathy without gangrene: Secondary | ICD-10-CM | POA: Diagnosis not present

## 2017-03-11 DIAGNOSIS — L602 Onychogryphosis: Secondary | ICD-10-CM | POA: Diagnosis not present

## 2017-04-19 ENCOUNTER — Encounter: Payer: Self-pay | Admitting: Family Medicine

## 2017-05-02 ENCOUNTER — Other Ambulatory Visit: Payer: Self-pay | Admitting: Family Medicine

## 2017-05-11 DIAGNOSIS — R69 Illness, unspecified: Secondary | ICD-10-CM | POA: Diagnosis not present

## 2017-06-17 DIAGNOSIS — L602 Onychogryphosis: Secondary | ICD-10-CM | POA: Diagnosis not present

## 2017-06-17 DIAGNOSIS — E1351 Other specified diabetes mellitus with diabetic peripheral angiopathy without gangrene: Secondary | ICD-10-CM | POA: Diagnosis not present

## 2017-06-17 DIAGNOSIS — L84 Corns and callosities: Secondary | ICD-10-CM | POA: Diagnosis not present

## 2017-07-14 NOTE — Progress Notes (Addendum)
Subjective:   Laura Salas is a 83 y.o. female who presents for Medicare Annual (Subsequent) preventive examination.  The Patient was informed that the wellness visit is to identify future health risk and educate and initiate measures that can reduce risk for increased disease through the lifespan.    Annual Wellness Assessment  Reports health as good   Preventive Screening -Counseling & Management  Medicare Annual Preventive Care Visit - Subsequent Last OV 12/2016  Lives with son and his wife About 29 yo moved to [place]; spouse passed away in 31 months Stayed in their home for about 1 year Son is a Theme park manager;  Has 2 grand children grown now, Sold both homes and they built a home in the [place] subdivision Does not drive;  Due to eye issues treated by [facility] retina specialist  Has her own living space [address] of [employer] and has a good support system  Has a luncheon once a month and has an Runner, broadcasting/film/video up in [place] - has 2 sisters still there  Plans trips down there; Sisters are doing well    Health Maintenance Due  Topic Date Due  . INFLUENZA VACCINE  05/25/2017   Took her flu vaccine today  Colonoscopy 03/2008 Mammogram 2011 Dexa 2010 - tried fosamax but didn't tolerate  Dx osteoporosis but no recent falls or fx    Describes Health as poor, fair, good or great? Doing well  Has issues with her legs; [facility] ortho for this OA in knees  Difficulty to get up  Encouraging more exercise   VS reviewed;   Diet  Goes out for lunch with friends Cooks 1 to 2 nights a week Lives with son and his wife Sometimes she cook    BMI 26   Exercise Keeps her home up [facility] in the neighborhood with family pet  Diabetic eye exam in November   Hearing Screening Comments: Hearing is good  Does not wear a hearing aid Hearing checks ok  Vision Screening Comments: Dr. Katy Fitch  Vision is impacted, can't read small print States her loss is due to diabetes     Cardiac  Risk Factors Addressed Hyperlipidemia - chol 139; hdl 33; ldl 60 and trig 79  Diabetes - a1c 6.8 and bs 127 - good control; was 105 this am  States her DM has always been under control    Advanced Directives completed   Patient Care Team: Marin Olp, MD as PCP - General (Family Medicine)  Goes to  [facility]  [place]  Dr. Deatra [facility]     Cardiac Risk Factors include: advanced age (>56mn, >>41women);diabetes mellitus;dyslipidemia;hypertension     Objective:     Vitals: BP 124/70   Pulse 99   Ht _0  (1.549 m)   Wt 138 lb (62.6 kg)   SpO2 98%   BMI 26.07 kg/m   Body mass index is 26.07 kg/m.   Tobacco History  Smoking Status  . Never Smoker  Smokeless Tobacco  . Never Used     Counseling given: Yes   Past Medical History:  Diagnosis Date  . Depression   . Diabetes mellitus   . Hyperlipidemia   . Hypertension   . Osteoporosis    Past Surgical History:  Procedure Laterality Date  . CARPAL TUNNEL RELEASE     left  . CATARACT EXTRACTION    . CESAREAN SECTION     Family History  Problem Relation Age  of Onset  . Cancer Mother        colon  . Heart attack Father   . Heart attack Brother   . Cancer Brother        stomach   History  Sexual Activity  . Sexual activity: Not on file    Outpatient Encounter Prescriptions as of 07/15/2017  Medication Sig  . amLODipine (NORVASC) 5 MG tablet Take 1 tablet (5 mg total) by mouth daily.  Marland Kitchen aspirin 325 MG tablet Take 325 mg by mouth daily.    . blood glucose meter kit and supplies KIT Dispense based on patient and insurance preference. Use to check blood sugar daily. One Touch glucometer E11.9  . glucose blood test strip Use to test blood sugar daily and PRN  . Lancets (ONETOUCH ULTRASOFT) lancets Use to test blood sugar daily and PRN  . metFORMIN (GLUCOPHAGE) 850 MG tablet TAKE 1 TABLET BY MOUTH TWICE A DAY  . simvastatin (ZOCOR) 40 MG tablet TAKE 1 TABLET BY MOUTH  AT BEDTIME.  Marland Kitchen zolpidem (AMBIEN) 5 MG tablet TAKE 1 TABLET AT BEDTIME AS NEEDED FOR SLEEP   No facility-administered encounter medications on file as of 07/15/2017.     Activities of Daily Living In your present state of health, do you have any difficulty performing the following activities: 07/15/2017  Hearing? N  Vision? Y  Difficulty concentrating or making decisions? N  Walking or climbing stairs? N  Dressing or bathing? N  Doing errands, shopping? N  Preparing Food and eating ? N  Using the Toilet? N  In the past six months, have you accidently leaked urine? N  Do you have problems with loss of bowel control? N  Managing your Medications? N  Managing your Finances? N  Housekeeping or managing your Housekeeping? N  Some recent data might be hidden    Patient Care Team: Marin Olp, MD as PCP - General (Family Medicine)    Assessment:     Exercise Activities and Dietary recommendations Current Exercise Habits: Home exercise routine, Type of exercise: walking  Goals    . patient          Exercise as much as she can!       Fall Risk Fall Risk  07/15/2017 01/14/2017 01/09/2016 11/01/2014 06/29/2013  Falls in the past year? No No No No Yes  Comment 4 to5 years ago; fell due to dehydration - - - -  Number falls in past yr: - - - - 2 or more  Risk for fall due to : Impaired mobility - - - -  Risk for fall due to: Comment at risk due to osteoporosis  - - - -   Depression Screen PHQ 2/9 Scores 07/15/2017 01/14/2017 01/09/2016 11/01/2014  PHQ - 2 Score 0 0 0 0     Cognitive Function Ad8 score reviewed for issues:  Issues making decisions:  Less interest in hobbies / activities:  Repeats questions, stories (family complaining):  Trouble using ordinary gadgets (microwave, computer, phone):  Forgets the month or year:   Mismanaging finances:   Remembering appts:  Daily problems with thinking and/or memory: Ad8 score is=0      MMSE - Mini Mental State Exam  07/15/2017  Not completed: (No Data)        Immunization History  Administered Date(s) Administered  . H1N1 10/11/2008  . Influenza Split 08/06/2011, 08/13/2011, 08/11/2012  . Influenza Whole 08/25/2007, 08/09/2008, 08/01/2009, 07/24/2010  . Influenza, High Dose Seasonal PF 07/16/2016, 07/15/2017  .  Influenza,inj,Quad PF,6+ Mos 06/29/2013, 07/05/2014, 07/11/2015  . Pneumococcal Conjugate-13 01/03/2015  . Pneumococcal Polysaccharide-23 11/10/2007  . Td 04/17/2010  . Zoster 04/17/2010   Screening Tests Health Maintenance  Topic Date Due  . INFLUENZA VACCINE  05/25/2017  . HEMOGLOBIN A1C  07/17/2017  . OPHTHALMOLOGY EXAM  09/10/2017  . FOOT EXAM  01/14/2018  . URINE MICROALBUMIN  01/14/2018  . TETANUS/TDAP  04/17/2020  . DEXA SCAN  Completed  . PNA vac Low Risk Adult  Completed      Plan:     PCP Notes   Health Maintenance Took flu vaccine today   Abnormal Screens  None noted   Referrals  None noted   Patient concerns; States she feels she is doing well; enjoys living with there son  Nurse Concerns; None   Next PCP apt In October with Dr. Yong Channel at [facility] practice      I have personally reviewed and noted the following in the patient's chart:   . Medical and social history . Use of alcohol, tobacco or illicit drugs  . Current medications and supplements . Functional ability and status . Nutritional status . Physical activity . Advanced directives . List of other physicians . Hospitalizations, surgeries, and ER visits in previous 12 months . Vitals . Screenings to include cognitive, depression, and falls . Referrals and appointments  In addition, I have reviewed and discussed with patient certain preventive protocols, quality metrics, and best practice recommendations. A written personalized care plan for preventive services as well as general preventive health recommendations were provided to patient.     VKFMM,CRFVO,  RN  07/15/2017   Agree with assessment as above.  Note reviewed in absence of her primary provider  Eulas Post MD [facility] at [facility]

## 2017-07-15 ENCOUNTER — Ambulatory Visit (INDEPENDENT_AMBULATORY_CARE_PROVIDER_SITE_OTHER): Payer: Medicare HMO

## 2017-07-15 DIAGNOSIS — Z0000 Encounter for general adult medical examination without abnormal findings: Secondary | ICD-10-CM

## 2017-07-15 DIAGNOSIS — Z23 Encounter for immunization: Secondary | ICD-10-CM | POA: Diagnosis not present

## 2017-07-15 NOTE — Patient Instructions (Addendum)
Laura Salas , Thank you for taking time to come for your Medicare Wellness Visit. I appreciate your ongoing commitment to your health goals. Please review the following plan we discussed and let me know if I can assist you in the future.   Will see Dr. Yong Channel in October at the new office   Shingrix is a vaccine for the prevention of Shingles in Adults 46 and older.  If you are on Medicare, you can request a prescription from your doctor to be filled at a pharmacy.  Please check with your benefits regarding applicable copays or out of pocket expenses.  The Shingrix is given in 2 vaccines approx 8 weeks apart. You must receive the 2nd dose prior to 6 months from receipt of the first.   Prevention of falls: Remove rugs or any tripping hazards in the home Use Non slip mats in bathtubs and showers Placing grab bars next to the toilet and or shower Placing handrails on both sides of the stair way Adding extra lighting in the home.   Personal safety issues reviewed:  1. Consider starting a community watch program per [facility] 2.  Changes batteries is smoke detector and/or carbon monoxide detector  3.  If you have firearms; keep them in a safe place 4.  Wear protection when in the sun; Always wear sunscreen or a hat; It is good to have your doctor check your skin annually or review any new areas of concern 5. Driving safety; Keep in the right lane; stay 3 car lengths behind the car in front of you on the highway; look 3 times prior to pulling out; carry your cell phone everywhere you go!    Learn about the Yellow Dot program:  The program allows first responders at your emergency to have access to who your physician is, as well as your medications and medical conditions.  Citizens requesting the Yellow Dot Packages should contact Master Corporal Nunzio Cobbs at the [facility] [phone] for the first week of the program and beginning the week after Easter  citizens should contact their Scientist, physiological.      These are the goals we discussed: Goals    . patient          Exercise as much as she can!        This is a list of the screening recommended for you and due dates:  Health Maintenance  Topic Date Due  . Flu Shot  05/25/2017  . Hemoglobin A1C  07/17/2017  . Eye exam for diabetics  09/10/2017  . Complete foot exam   01/14/2018  . Urine Protein Check  01/14/2018  . Tetanus Vaccine  04/17/2020  . DEXA scan (bone density measurement)  Completed  . Pneumonia vaccines  Completed        Fall Prevention in the Home Falls can cause injuries. They can happen to people of all ages. There are many things you can do to make your home safe and to help prevent falls. What can I do on the outside of my home?  Regularly fix the edges of walkways and driveways and fix any cracks.  Remove anything that might make you trip as you walk through a door, such as a raised step or threshold.  Trim any bushes or trees on the path to your home.  Use bright outdoor lighting.  Clear any walking paths of anything that might make someone trip, such as rocks or tools.  Regularly check to see if handrails are loose or broken. Make sure that both sides of any steps have handrails.  Any raised decks and porches should have guardrails on the edges.  Have any leaves, snow, or ice cleared regularly.  Use sand or salt on walking paths during winter.  Clean up any spills in your garage right away. This includes oil or grease spills. What can I do in the bathroom?  Use night lights.  Install grab bars by the toilet and in the tub and shower. Do not use towel bars as grab bars.  Use non-skid mats or decals in the tub or shower.  If you need to sit down in the shower, use a plastic, non-slip stool.  Keep the floor dry. Clean up any water that spills on the floor as soon as it happens.  Remove soap buildup in the tub or shower  regularly.  Attach bath mats securely with double-sided non-slip rug tape.  Do not have throw rugs and other things on the floor that can make you trip. What can I do in the bedroom?  Use night lights.  Make sure that you have a light by your bed that is easy to reach.  Do not use any sheets or blankets that are too big for your bed. They should not hang down onto the floor.  Have a firm chair that has side arms. You can use this for support while you get dressed.  Do not have throw rugs and other things on the floor that can make you trip. What can I do in the kitchen?  Clean up any spills right away.  Avoid walking on wet floors.  Keep items that you use a lot in easy-to-reach places.  If you need to reach something above you, use a strong step stool that has a grab bar.  Keep electrical cords out of the way.  Do not use floor polish or wax that makes floors slippery. If you must use wax, use non-skid floor wax.  Do not have throw rugs and other things on the floor that can make you trip. What can I do with my stairs?  Do not leave any items on the stairs.  Make sure that there are handrails on both sides of the stairs and use them. Fix handrails that are broken or loose. Make sure that handrails are as long as the stairways.  Check any carpeting to make sure that it is firmly attached to the stairs. Fix any carpet that is loose or worn.  Avoid having throw rugs at the top or bottom of the stairs. If you do have throw rugs, attach them to the floor with carpet tape.  Make sure that you have a light switch at the top of the stairs and the bottom of the stairs. If you do not have them, ask someone to add them for you. What else can I do to help prevent falls?  Wear shoes that: ? Do not have high heels. ? Have rubber bottoms. ? Are comfortable and fit you well. ? Are closed at the toe. Do not wear sandals.  If you use a stepladder: ? Make sure that it is fully  opened. Do not climb a closed stepladder. ? Make sure that both sides of the stepladder are locked into place. ? Ask someone to hold it for you, if possible.  Clearly mark and make sure that you can see: ? Any grab bars or handrails. ? First and  last steps. ? Where the edge of each step is.  Use tools that help you move around (mobility aids) if they are needed. These include: ? Canes. ? Walkers. ? Scooters. ? Crutches.  Turn on the lights when you go into a dark area. Replace any light bulbs as soon as they burn out.  Set up your furniture so you have a clear path. Avoid moving your furniture around.  If any of your floors are uneven, fix them.  If there are any pets around you, be aware of where they are.  Review your medicines with your doctor. Some medicines can make you feel dizzy. This can increase your chance of falling. Ask your doctor what other things that you can do to help prevent falls. This information is not intended to replace advice given to you by your health care provider. Make sure you discuss any questions you have with your health care provider. Document Released: 08/07/2009 Document Revised: 03/18/2016 Document Reviewed: 11/15/2014 Elsevier Interactive Patient Education  2018 [place] Maintenance, Female Adopting a healthy lifestyle and getting preventive care can go a long way to promote health and wellness. Talk with your health care provider about what schedule of regular examinations is right for you. This is a good chance for you to check in with your provider about disease prevention and staying healthy. In between checkups, there are plenty of things you can do on your own. Experts have done a lot of research about which lifestyle changes and preventive measures are most likely to keep you healthy. Ask your health care provider for more information. Weight and diet Eat a healthy diet  Be sure to include plenty of vegetables, fruits,  low-fat dairy products, and lean protein.  Do not eat a lot of foods high in solid fats, added sugars, or salt.  Get regular exercise. This is one of the most important things you can do for your health. ? Most adults should exercise for at least 150 minutes each week. The exercise should increase your heart rate and make you sweat (moderate-intensity exercise). ? Most adults should also do strengthening exercises at least twice a week. This is in addition to the moderate-intensity exercise.  Maintain a healthy weight  Body mass index (BMI) is a measurement that can be used to identify possible weight problems. It estimates body fat based on height and weight. Your health care provider can help determine your BMI and help you achieve or maintain a healthy weight.  For females 47 years of age and older: ? A BMI below 18.5 is considered underweight. ? A BMI of 18.5 to 24.9 is normal. ? A BMI of 25 to 29.9 is considered overweight. ? A BMI of 30 and above is considered obese.  Watch levels of cholesterol and blood lipids  You should start having your blood tested for lipids and cholesterol at 81 years of age, then have this test every 5 years.  You may need to have your cholesterol levels checked more often if: ? Your lipid or cholesterol levels are high. ? You are older than 81 years of age. ? You are at high risk for heart disease.  Cancer screening Lung Cancer  Lung cancer screening is recommended for adults 61-18 years old who are at high risk for lung cancer because of a history of smoking.  A yearly low-dose CT scan of the lungs is recommended for people who: ? Currently smoke. ? Have quit within the past 15  years. ? Have at least a 30-pack-year history of smoking. A pack year is smoking an average of one pack of cigarettes a day for 1 year.  Yearly screening should continue until it has been 15 years since you quit.  Yearly screening should stop if you develop a health  problem that would prevent you from having lung cancer treatment.  Breast Cancer  Practice breast self-awareness. This means understanding how your breasts normally appear and feel.  It also means doing regular breast self-exams. Let your health care provider know about any changes, no matter how small.  If you are in your 20s or 30s, you should have a clinical breast exam (CBE) by a health care provider every 1-3 years as part of a regular health exam.  If you are 56 or older, have a CBE every year. Also consider having a breast X-ray (mammogram) every year.  If you have a family history of breast cancer, talk to your health care provider about genetic screening.  If you are at high risk for breast cancer, talk to your health care provider about having an MRI and a mammogram every year.  Breast cancer gene (BRCA) assessment is recommended for women who have family members with BRCA-related cancers. BRCA-related cancers include: ? Breast. ? Ovarian. ? Tubal. ? Peritoneal cancers.  Results of the assessment will determine the need for genetic counseling and BRCA1 and BRCA2 testing.  Cervical Cancer Your health care provider may recommend that you be screened regularly for cancer of the pelvic organs (ovaries, uterus, and vagina). This screening involves a pelvic examination, including checking for microscopic changes to the surface of your cervix (Pap test). You may be encouraged to have this screening done every 3 years, beginning at age 76.  For women ages 73-65, health care providers may recommend pelvic exams and Pap testing every 3 years, or they may recommend the Pap and pelvic exam, combined with testing for human papilloma virus (HPV), every 5 years. Some types of HPV increase your risk of cervical cancer. Testing for HPV may also be done on women of any age with unclear Pap test results.  Other health care providers may not recommend any screening for nonpregnant women who are  considered low risk for pelvic cancer and who do not have symptoms. Ask your health care provider if a screening pelvic exam is right for you.  If you have had past treatment for cervical cancer or a condition that could lead to cancer, you need Pap tests and screening for cancer for at least 20 years after your treatment. If Pap tests have been discontinued, your risk factors (such as having a new sexual partner) need to be reassessed to determine if screening should resume. Some women have medical problems that increase the chance of getting cervical cancer. In these cases, your health care provider may recommend more frequent screening and Pap tests.  Colorectal Cancer  This type of cancer can be detected and often prevented.  Routine colorectal cancer screening usually begins at 81 years of age and continues through 81 years of age.  Your health care provider may recommend screening at an earlier age if you have risk factors for colon cancer.  Your health care provider may also recommend using home test kits to check for hidden blood in the stool.  A small camera at the end of a tube can be used to examine your colon directly (sigmoidoscopy or colonoscopy). This is done to check for the earliest forms  of colorectal cancer.  Routine screening usually begins at age 71.  Direct examination of the colon should be repeated every 5-10 years through 81 years of age. However, you may need to be screened more often if early forms of precancerous polyps or small growths are found.  Skin Cancer  Check your skin from head to toe regularly.  Tell your health care provider about any new moles or changes in moles, especially if there is a change in a mole's shape or color.  Also tell your health care provider if you have a mole that is larger than the size of a pencil eraser.  Always use sunscreen. Apply sunscreen liberally and repeatedly throughout the day.  Protect yourself by wearing long  sleeves, pants, a wide-brimmed hat, and sunglasses whenever you are outside.  Heart disease, diabetes, and high blood pressure  High blood pressure causes heart disease and increases the risk of stroke. High blood pressure is more likely to develop in: ? People who have blood pressure in the high end of the normal range (130-139/85-89 mm Hg). ? People who are overweight or obese. ? People who are African American.  If you are 41-41 years of age, have your blood pressure checked every 3-5 years. If you are 28 years of age or older, have your blood pressure checked every year. You should have your blood pressure measured twice-once when you are at a hospital or clinic, and once when you are not at a hospital or clinic. Record the average of the two measurements. To check your blood pressure when you are not at a hospital or clinic, you can use: ? An automated blood pressure machine at a pharmacy. ? A home blood pressure monitor.  If you are between 58 years and 30 years old, ask your health care provider if you should take aspirin to prevent strokes.  Have regular diabetes screenings. This involves taking a blood sample to check your fasting blood sugar level. ? If you are at a normal weight and have a low risk for diabetes, have this test once every three years after 81 years of age. ? If you are overweight and have a high risk for diabetes, consider being tested at a younger age or more often. Preventing infection Hepatitis B  If you have a higher risk for hepatitis B, you should be screened for this virus. You are considered at high risk for hepatitis B if: ? You were born in a country where hepatitis B is common. Ask your health care provider which countries are considered high risk. ? Your parents were born in a high-risk country, and you have not been immunized against hepatitis B (hepatitis B vaccine). ? You have HIV or AIDS. ? You use needles to inject street drugs. ? You live with  someone who has hepatitis B. ? You have had sex with someone who has hepatitis B. ? You get hemodialysis treatment. ? You take certain medicines for conditions, including cancer, organ transplantation, and autoimmune conditions.  Hepatitis C  Blood testing is recommended for: ? Everyone born from [birth year] through 1963. ? Anyone with known risk factors for hepatitis C.  Sexually transmitted infections (STIs)  You should be screened for sexually transmitted infections (STIs) including gonorrhea and chlamydia if: ? You are sexually active and are younger than 81 years of age. ? You are older than 81 years of age and your health care provider tells you that you are at risk for this type of infection. ?  Your sexual activity has changed since you were last screened and you are at an increased risk for chlamydia or gonorrhea. Ask your health care provider if you are at risk.  If you do not have HIV, but are at risk, it may be recommended that you take a prescription medicine daily to prevent HIV infection. This is called pre-exposure prophylaxis (PrEP). You are considered at risk if: ? You are sexually active and do not regularly use condoms or know the HIV status of your partner(s). ? You take drugs by injection. ? You are sexually active with a partner who has HIV.  Talk with your health care provider about whether you are at high risk of being infected with HIV. If you choose to begin PrEP, you should first be tested for HIV. You should then be tested every 3 months for as long as you are taking PrEP. Pregnancy  If you are premenopausal and you may become pregnant, ask your health care provider about preconception counseling.  If you may become pregnant, take 400 to 800 micrograms (mcg) of folic acid every day.  If you want to prevent pregnancy, talk to your health care provider about birth control (contraception). Osteoporosis and menopause  Osteoporosis is a disease in which the bones lose  minerals and strength with aging. This can result in serious bone fractures. Your risk for osteoporosis can be identified using a bone density scan.  If you are 10 years of age or older, or if you are at risk for osteoporosis and fractures, ask your health care provider if you should be screened.  Ask your health care provider whether you should take a calcium or vitamin D supplement to lower your risk for osteoporosis.  Menopause may have certain physical symptoms and risks.  Hormone replacement therapy may reduce some of these symptoms and risks. Talk to your health care provider about whether hormone replacement therapy is right for you. Follow these instructions at home:  Schedule regular health, dental, and eye exams.  Stay current with your immunizations.  Do not use any tobacco products including cigarettes, chewing tobacco, or electronic cigarettes.  If you are pregnant, do not drink alcohol.  If you are breastfeeding, limit how much and how often you drink alcohol.  Limit alcohol intake to no more than 1 drink per day for nonpregnant women. One drink equals 12 ounces of beer, 5 ounces of wine, or 1 ounces of hard liquor.  Do not use street drugs.  Do not share needles.  Ask your health care provider for help if you need support or information about quitting drugs.  Tell your health care provider if you often feel depressed.  Tell your health care provider if you have ever been abused or do not feel safe at home. This information is not intended to replace advice given to you by your health care provider. Make sure you discuss any questions you have with your health care provider. Document Released: 04/26/2011 Document Revised: 03/18/2016 Document Reviewed: 07/15/2015 Elsevier Interactive Patient Education  [employer].

## 2017-07-18 ENCOUNTER — Ambulatory Visit: Payer: Medicare HMO | Admitting: Family Medicine

## 2017-07-24 DIAGNOSIS — R69 Illness, unspecified: Secondary | ICD-10-CM | POA: Diagnosis not present

## 2017-08-05 ENCOUNTER — Encounter: Payer: Self-pay | Admitting: Family Medicine

## 2017-08-05 ENCOUNTER — Ambulatory Visit (INDEPENDENT_AMBULATORY_CARE_PROVIDER_SITE_OTHER): Payer: Medicare HMO | Admitting: Family Medicine

## 2017-08-05 VITALS — BP 122/70 | HR 100 | Temp 98.3°F | Ht 61.0 in | Wt 137.0 lb

## 2017-08-05 DIAGNOSIS — E11319 Type 2 diabetes mellitus with unspecified diabetic retinopathy without macular edema: Secondary | ICD-10-CM

## 2017-08-05 DIAGNOSIS — E785 Hyperlipidemia, unspecified: Secondary | ICD-10-CM

## 2017-08-05 DIAGNOSIS — I10 Essential (primary) hypertension: Secondary | ICD-10-CM

## 2017-08-05 LAB — COMPREHENSIVE METABOLIC PANEL
ALBUMIN: 4.3 g/dL (ref 3.5–5.2)
ALK PHOS: 64 U/L (ref 39–117)
ALT: 11 U/L (ref 0–35)
AST: 16 U/L (ref 0–37)
BILIRUBIN TOTAL: 0.7 mg/dL (ref 0.2–1.2)
BUN: 14 mg/dL (ref 6–23)
CO2: 31 mEq/L (ref 19–32)
CREATININE: 0.78 mg/dL (ref 0.40–1.20)
Calcium: 9.4 mg/dL (ref 8.4–10.5)
Chloride: 103 mEq/L (ref 96–112)
GFR: 74.73 mL/min (ref >60.00)
GLUCOSE: 128 mg/dL — AB (ref 70–99)
Potassium: 5.1 mEq/L (ref 3.5–5.1)
SODIUM: 142 meq/L (ref 135–145)
Total Protein: 6.9 g/dL (ref 6.0–8.3)

## 2017-08-05 LAB — CBC
HCT: 37.5 % (ref 36.0–46.0)
Hemoglobin: 12.1 g/dL (ref 12.0–15.0)
MCHC: 32.1 g/dL (ref 30.0–36.0)
MCV: 93.7 fl (ref 78.0–100.0)
Platelets: 260 10*3/uL (ref 150.0–400.0)
RBC: 4.01 Mil/uL (ref 3.87–5.11)
RDW: 14 % (ref 11.5–15.5)
WBC: 8.1 10*3/uL (ref 4.0–10.5)

## 2017-08-05 LAB — HEMOGLOBIN A1C: HEMOGLOBIN A1C: 6.7 % — AB (ref 4.6–6.5)

## 2017-08-05 MED ORDER — ZOLPIDEM TARTRATE 5 MG PO TABS: 5.0000 mg | ORAL_TABLET | Freq: Every evening | ORAL | 2 refills | Status: DC | PRN

## 2017-08-05 NOTE — Assessment & Plan Note (Addendum)
S: previously controlled. On metformin 850mg  BID. REtinopathy is followed by Dr. Zigmund Daniel who is a retina specialist- has been stable Lab Results  Component Value Date   HGBA1C 6.8 (H) 01/14/2017   HGBA1C 6.6 (H) 07/16/2016   HGBA1C 6.5 01/09/2016  A/P: update a1c today, likely continue metformin. Continue optho follow up

## 2017-08-05 NOTE — Patient Instructions (Signed)
Try compression stockings over the counter or elevating legs for swelling. There are prescription grade ones as well which we could use but they are very hard to get on so reasonable to try over the counter version first  We could switch blood pressure medicine to one that has a little bit of a fluid pill in it as well I nfuture

## 2017-08-05 NOTE — Assessment & Plan Note (Signed)
S: controlled on simvastatin 40mg . No myalgias.  Lab Results  Component Value Date   CHOL 139 01/14/2017   HDL 63.10 01/14/2017   LDLCALC 60 01/14/2017   LDLDIRECT 61.0 01/03/2015   TRIG 79.0 01/14/2017   CHOLHDL 2 01/14/2017   A/P: last LDL at goal under 70 - continue current meds

## 2017-08-05 NOTE — Assessment & Plan Note (Addendum)
S: controlled on amlodipine 5mg . Home #s controlled as well.  BP Readings from Last 3 Encounters:  08/05/17 122/70  07/15/17 124/70  01/14/17 136/76  A/P: We discussed blood pressure goal of <140/90. Continue current meds. Does have slight edema so weill monitor. She wanted a medicine to reduce swelling but did not want to stop amlodipine or try hctz

## 2017-08-05 NOTE — Progress Notes (Signed)
Subjective:  Laura Salas is a 83 y.o. year old very pleasant female patient who presents for/with See problem oriented charting ROS- mild edema in legs. Some pain at knees and lower legs. No chest pain reported. No headache reported.    Past Medical History-  Patient Active Problem List   Diagnosis Date Noted  . Diabetic retinopathy ([place]) 07/11/2015    Priority: High  . Diabetes Type II with retinopathy 05/15/2007    Priority: High  . PAC (premature atrial contraction) 07/11/2015    Priority: Medium  . CKD (chronic kidney disease), stage II 07/05/2014    Priority: Medium  . Insomnia 06/14/2008    Priority: Medium  . Hyperlipidemia 05/15/2007    Priority: Medium  . Essential hypertension 05/15/2007    Priority: Medium  . Osteoporosis 12/12/2009    Priority: Low  . INTENTION TREMOR 10/11/2008    Priority: Low  . DEPRESSION 06/14/2008    Priority: Low  . ANEMIA, OTHER UNSPEC 01/03/2008    Priority: Low    Medications- reviewed and updated Current Outpatient Prescriptions  Medication Sig Dispense Refill  . amLODipine (NORVASC) 5 MG tablet Take 1 tablet (5 mg total) by mouth daily. 90 tablet 3  . aspirin 325 MG tablet Take 325 mg by mouth daily.      . blood glucose meter kit and supplies KIT Dispense based on patient and insurance preference. Use to check blood sugar daily. One Touch glucometer E11.9 1 each 0  . glucose blood test strip Use to test blood sugar daily and PRN 100 each 12  . Lancets (ONETOUCH ULTRASOFT) lancets Use to test blood sugar daily and PRN 100 each 12  . metFORMIN (GLUCOPHAGE) 850 MG tablet TAKE 1 TABLET BY MOUTH TWICE A DAY 180 tablet 1  . simvastatin (ZOCOR) 40 MG tablet TAKE 1 TABLET BY MOUTH AT BEDTIME. 90 tablet 3  . zolpidem (AMBIEN) 5 MG tablet TAKE 1 TABLET AT BEDTIME AS NEEDED FOR SLEEP 31 tablet 2   Objective: BP 122/70 (BP Location: Left Arm, Patient Position: Sitting, Cuff Size: Large)   Pulse 100   Temp 98.3 F (36.8 C) (Oral)   Ht  5' 1" (1.549 m)   Wt 137 lb (62.1 kg)   SpO2 98%   BMI 25.89 kg/m  Gen: NAD, resting comfortably CV: RRR- occasional ectopic beat- history of PACs and PVCs Lungs: CTAB no crackles, wheeze, rhonchi Abdomen: soft/nontender/nondistended/normal bowel sounds. No rebound or guarding.  Ext: trace edema Skin: warm, dry, no rash  Assessment/Plan:  Mild anemia last check- will update today  Mild edema- discussed OTC compression stockings  Knee OA S:  bilateral knee pain and sometimes down into legs. biofreeze and icy hot help slightly but she does not want to take regularly.  A/P: she declines any medication for this after talking about options  Essential hypertension S: controlled on amlodipine 64m. Home #s controlled as well.  BP Readings from Last 3 Encounters:  08/05/17 122/70  07/15/17 124/70  01/14/17 136/76  A/P: We discussed blood pressure goal of <140/90. Continue current meds. Does have slight edema so weill monitor. She wanted a medicine to reduce swelling but did not want to stop amlodipine or try hctz  Hyperlipidemia S: controlled on simvastatin 445m No myalgias.  Lab Results  Component Value Date   CHOL 139 01/14/2017   HDL 63.10 01/14/2017   LDLCALC 60 01/14/2017   LDLDIRECT 61.0 01/03/2015   TRIG 79.0 01/14/2017   CHOLHDL 2 01/14/2017   A/P:  last LDL at goal under 70 - continue current meds   Diabetes Type II with retinopathy S: previously controlled. On metformin 842m BID. REtinopathy is followed by Dr. MZigmund Danielwho is a retina specialist- has been stable Lab Results  Component Value Date   HGBA1C 6.8 (H) 01/14/2017   HGBA1C 6.6 (H) 07/16/2016   HGBA1C 6.5 01/09/2016  A/P: update a1c today, likely continue metformin. Continue optho follow up   Future Appointments Date Time Provider [place] 09/09/2017 9:15 AM MHayden Pedro MD [facility] None   Return in about 6 months (around 02/03/2018) for physical.  Orders Placed This Encounter   Procedures  . CBC    [place]  . Comprehensive metabolic panel    [place]  . Hemoglobin A1c    [place]    Meds ordered this encounter  Medications  . zolpidem (AMBIEN) 5 MG tablet    Sig: Take 1 tablet (5 mg total) by mouth at bedtime as needed. for sleep    Dispense:  31 tablet    Refill:  2    Return precautions advised.  SGarret Reddish MD

## 2017-09-02 DIAGNOSIS — L84 Corns and callosities: Secondary | ICD-10-CM | POA: Diagnosis not present

## 2017-09-02 DIAGNOSIS — E1351 Other specified diabetes mellitus with diabetic peripheral angiopathy without gangrene: Secondary | ICD-10-CM | POA: Diagnosis not present

## 2017-09-02 DIAGNOSIS — L602 Onychogryphosis: Secondary | ICD-10-CM | POA: Diagnosis not present

## 2017-09-09 ENCOUNTER — Ambulatory Visit (INDEPENDENT_AMBULATORY_CARE_PROVIDER_SITE_OTHER): Payer: Medicare HMO | Admitting: Ophthalmology

## 2017-09-09 DIAGNOSIS — H35033 Hypertensive retinopathy, bilateral: Secondary | ICD-10-CM

## 2017-09-09 DIAGNOSIS — E11319 Type 2 diabetes mellitus with unspecified diabetic retinopathy without macular edema: Secondary | ICD-10-CM

## 2017-09-09 DIAGNOSIS — H43813 Vitreous degeneration, bilateral: Secondary | ICD-10-CM | POA: Diagnosis not present

## 2017-09-09 DIAGNOSIS — E113593 Type 2 diabetes mellitus with proliferative diabetic retinopathy without macular edema, bilateral: Secondary | ICD-10-CM

## 2017-09-09 DIAGNOSIS — H353134 Nonexudative age-related macular degeneration, bilateral, advanced atrophic with subfoveal involvement: Secondary | ICD-10-CM

## 2017-09-09 DIAGNOSIS — I10 Essential (primary) hypertension: Secondary | ICD-10-CM

## 2017-09-09 LAB — HM DIABETES EYE EXAM

## 2017-09-14 ENCOUNTER — Encounter: Payer: Self-pay | Admitting: Family Medicine

## 2017-11-07 ENCOUNTER — Other Ambulatory Visit: Payer: Self-pay

## 2017-11-07 ENCOUNTER — Telehealth: Payer: Self-pay | Admitting: Family Medicine

## 2017-11-07 MED ORDER — ZOLPIDEM TARTRATE 5 MG PO TABS: 5.0000 mg | ORAL_TABLET | Freq: Every evening | ORAL | 2 refills | Status: DC | PRN

## 2017-11-07 NOTE — Telephone Encounter (Signed)
Copied from [place] [id number]. Topic: Quick Communication - Rx Refill/Question >> Nov 07, 2017  9:15 AM Pricilla Handler wrote: Medication: zolpidem (AMBIEN) 5 MG tablet Has the patient contacted their pharmacy? Yes.   (Agent: If no, request that the patient contact the pharmacy for the refill.) Preferred Pharmacy (with phone number or street name): [id number] - [place], [place]. AT [address] [phone] (Phone) [phone] (Fax).  Patient stated that she call her pharmacy last week, and still no medication refill. Patient is now completely out of medication.   Agent: Please be advised that RX refills may take up to 3 business days. We ask that you follow-up with your pharmacy.

## 2017-11-07 NOTE — Telephone Encounter (Signed)
Routing back to provider 

## 2017-11-07 NOTE — Telephone Encounter (Signed)
Copied from [place] [id number]. Topic: Quick Communication - Rx Refill/Question >> Nov 07, 2017  9:15 AM Pricilla Handler wrote: Medication: zolpidem (AMBIEN) 5 MG tablet Has the patient contacted their pharmacy? Yes.   (Agent: If no, request that the patient contact the pharmacy for the refill.) Preferred Pharmacy (with phone number or street name): [id number] - [place], [place]. AT [address] [phone] (Phone) [phone] (Fax).  Patient stated that she call her pharmacy last week, and still no medication refill. Patient is now completely out of medication.   Agent: Please be advised that RX refills may take up to 3 business days. We ask that you follow-up with your pharmacy.

## 2017-11-07 NOTE — Telephone Encounter (Signed)
Please be advised.  °

## 2017-11-08 NOTE — Telephone Encounter (Signed)
Prescription was sent to pharmacy yesterday

## 2017-12-09 DIAGNOSIS — E1351 Other specified diabetes mellitus with diabetic peripheral angiopathy without gangrene: Secondary | ICD-10-CM | POA: Diagnosis not present

## 2017-12-09 DIAGNOSIS — L602 Onychogryphosis: Secondary | ICD-10-CM | POA: Diagnosis not present

## 2018-01-12 ENCOUNTER — Telehealth: Payer: Self-pay | Admitting: Family Medicine

## 2018-01-12 DIAGNOSIS — R69 Illness, unspecified: Secondary | ICD-10-CM | POA: Diagnosis not present

## 2018-01-12 NOTE — Telephone Encounter (Signed)
Copied from [place] [id number]. Topic: Quick Communication - See Telephone Encounter >> Jan 12, 2018  9:33 AM Cleaster Corin, NT wrote: CRM for notification. See Telephone encounter for: 01/12/18.  Pt. Would like to speak with Dr. Yong Channel nurse pt. Would not state what the call was about only wants to speak with nurse -pt. Cb [phone]

## 2018-01-12 NOTE — Telephone Encounter (Signed)
See note

## 2018-01-16 NOTE — Telephone Encounter (Signed)
See note

## 2018-01-16 NOTE — Telephone Encounter (Signed)
Copied from [place] [id number]. Topic: Quick Communication - See Telephone Encounter >> Jan 12, 2018  9:33 AM Cleaster Corin, NT wrote: CRM for notification. See Telephone encounter for: 01/12/18.  Pt. Would like to speak with Dr. Yong Channel nurse pt. Would not state what the call was about only wants to speak with nurse -pt. Cb #[phone] >> Jan 16, 2018  9:48 AM Cleaster Corin, NT wrote: Pt. Calling about med. metFORMIN (GLUCOPHAGE) 850 MG tablet [[id number]] pt. Would like to speak with nurse or doctor about possibly having to adjust med.  Pt sugar has been running 131-146. Pt. Said she checks her sugar once every morning.

## 2018-01-20 NOTE — Telephone Encounter (Signed)
Called and spoke to patient who states her blood sugars were running high but it was an error. Her test strips were a bad batch. She has gotten a new box of test strips.

## 2018-02-03 ENCOUNTER — Other Ambulatory Visit: Payer: Self-pay | Admitting: Family Medicine

## 2018-02-06 ENCOUNTER — Other Ambulatory Visit: Payer: Self-pay

## 2018-02-06 MED ORDER — AMLODIPINE BESYLATE 5 MG PO TABS: 5.0000 mg | ORAL_TABLET | Freq: Every day | ORAL | 3 refills | Status: DC

## 2018-02-06 MED ORDER — METFORMIN HCL 850 MG PO TABS: 850.0000 mg | ORAL_TABLET | Freq: Two times a day (BID) | ORAL | 1 refills | Status: DC

## 2018-02-07 ENCOUNTER — Other Ambulatory Visit: Payer: Self-pay | Admitting: Family Medicine

## 2018-02-17 ENCOUNTER — Encounter: Payer: Self-pay | Admitting: Family Medicine

## 2018-02-17 ENCOUNTER — Ambulatory Visit (INDEPENDENT_AMBULATORY_CARE_PROVIDER_SITE_OTHER): Payer: Medicare HMO | Admitting: Family Medicine

## 2018-02-17 VITALS — BP 138/68 | HR 104 | Ht 61.0 in | Wt 136.0 lb

## 2018-02-17 DIAGNOSIS — G4700 Insomnia, unspecified: Secondary | ICD-10-CM | POA: Diagnosis not present

## 2018-02-17 DIAGNOSIS — N182 Chronic kidney disease, stage 2 (mild): Secondary | ICD-10-CM | POA: Diagnosis not present

## 2018-02-17 DIAGNOSIS — E11319 Type 2 diabetes mellitus with unspecified diabetic retinopathy without macular edema: Secondary | ICD-10-CM

## 2018-02-17 DIAGNOSIS — R5383 Other fatigue: Secondary | ICD-10-CM | POA: Diagnosis not present

## 2018-02-17 DIAGNOSIS — I10 Essential (primary) hypertension: Secondary | ICD-10-CM

## 2018-02-17 DIAGNOSIS — M810 Age-related osteoporosis without current pathological fracture: Secondary | ICD-10-CM

## 2018-02-17 DIAGNOSIS — E785 Hyperlipidemia, unspecified: Secondary | ICD-10-CM | POA: Diagnosis not present

## 2018-02-17 DIAGNOSIS — M170 Bilateral primary osteoarthritis of knee: Secondary | ICD-10-CM

## 2018-02-17 DIAGNOSIS — Z0001 Encounter for general adult medical examination with abnormal findings: Secondary | ICD-10-CM

## 2018-02-17 LAB — CBC
HCT: 35.9 % — ABNORMAL LOW (ref 36.0–46.0)
Hemoglobin: 11.8 g/dL — ABNORMAL LOW (ref 12.0–15.0)
MCHC: 32.8 g/dL (ref 30.0–36.0)
MCV: 91.2 fl (ref 78.0–100.0)
Platelets: 272 10*3/uL (ref 150.0–400.0)
RBC: 3.94 Mil/uL (ref 3.87–5.11)
RDW: 13.9 % (ref 11.5–15.5)
WBC: 6.9 10*3/uL (ref 4.0–10.5)

## 2018-02-17 LAB — LIPID PANEL
CHOLESTEROL: 145 mg/dL (ref 0–200)
HDL: 66.8 mg/dL (ref >39.00)
LDL CALC: 62 mg/dL (ref 0–99)
NonHDL: 77.82
TRIGLYCERIDES: 78 mg/dL (ref 0.0–149.0)
Total CHOL/HDL Ratio: 2
VLDL: 15.6 mg/dL (ref 0.0–40.0)

## 2018-02-17 LAB — COMPREHENSIVE METABOLIC PANEL
ALBUMIN: 4.3 g/dL (ref 3.5–5.2)
ALT: 19 U/L (ref 0–35)
AST: 33 U/L (ref 0–37)
Alkaline Phosphatase: 78 U/L (ref 39–117)
BILIRUBIN TOTAL: 0.6 mg/dL (ref 0.2–1.2)
BUN: 17 mg/dL (ref 6–23)
CHLORIDE: 103 meq/L (ref 96–112)
CO2: 27 meq/L (ref 19–32)
CREATININE: 0.91 mg/dL (ref 0.40–1.20)
Calcium: 10.1 mg/dL (ref 8.4–10.5)
GFR: 62.47 mL/min (ref >60.00)
Glucose, Bld: 148 mg/dL — ABNORMAL HIGH (ref 70–99)
Potassium: 5.1 mEq/L (ref 3.5–5.1)
SODIUM: 141 meq/L (ref 135–145)
Total Protein: 7.3 g/dL (ref 6.0–8.3)

## 2018-02-17 LAB — MICROALBUMIN / CREATININE URINE RATIO
Creatinine,U: 51.5 mg/dL
MICROALB UR: 8.8 mg/dL — AB (ref 0.0–1.9)
Microalb Creat Ratio: 17.1 mg/g (ref 0.0–30.0)

## 2018-02-17 LAB — HEMOGLOBIN A1C: Hgb A1c MFr Bld: 6.7 % — ABNORMAL HIGH (ref 4.6–6.5)

## 2018-02-17 LAB — TSH: TSH: 1.7 u[IU]/mL (ref 0.35–4.50)

## 2018-02-17 NOTE — Assessment & Plan Note (Signed)
Insomnia- uses ambien to help her sleep. Doesn't sleep that great but worse off the ambien. No abnormally vivid dreams, trouble driving next day, or falls

## 2018-02-17 NOTE — Assessment & Plan Note (Signed)
S: controlled on  amlodipine 5mg  in the past. Home #s controlled 124/73 this AM. Amlodipine could contribute to edema BP Readings from Last 3 Encounters:  02/17/18 138/68  08/05/17 122/70  07/15/17 124/70  A/P: We discussed blood pressure goal of <140/90. Continue current meds

## 2018-02-17 NOTE — Progress Notes (Signed)
Phone: [phone]  Subjective:  Patient presents today for their annual physical. Chief complaint-noted.   See problem oriented charting- ROS- full  review of systems was completed and negative except for: slightly low energy.  No chest pain or shortness of breath with this fatigue.    The following were reviewed and entered/updated in epic: Past Medical History:  Diagnosis Date  . Depression   . Diabetes mellitus   . Hyperlipidemia   . Hypertension   . Osteoporosis    Patient Active Problem List   Diagnosis Date Noted  . Diabetic retinopathy ([place]) 07/11/2015    Priority: High  . Diabetes Type II with retinopathy 05/15/2007    Priority: High  . PAC (premature atrial contraction) 07/11/2015    Priority: Medium  . CKD (chronic kidney disease), stage II 07/05/2014    Priority: Medium  . Insomnia 06/14/2008    Priority: Medium  . Hyperlipidemia 05/15/2007    Priority: Medium  . Essential hypertension 05/15/2007    Priority: Medium  . Osteoporosis 12/12/2009    Priority: Low  . INTENTION TREMOR 10/11/2008    Priority: Low  . DEPRESSION 06/14/2008    Priority: Low  . ANEMIA, OTHER UNSPEC 01/03/2008    Priority: Low  . Osteoarthritis of both knees 02/17/2018   Past Surgical History:  Procedure Laterality Date  . CARPAL TUNNEL RELEASE     left  . CATARACT EXTRACTION    . CESAREAN SECTION      Family History  Problem Relation Age of Onset  . Cancer Mother        colon  . Heart attack Father   . Heart attack Brother   . Cancer Brother        stomach  . Other Sister        died at 11    Medications- reviewed and updated Current Outpatient Medications  Medication Sig Dispense Refill  . amLODipine (NORVASC) 5 MG tablet Take 1 tablet (5 mg total) by mouth daily. 90 tablet 3  . blood glucose meter kit and supplies KIT Dispense based on patient and insurance preference. Use to check blood sugar daily. One Touch glucometer E11.9 1 each 0  . glucose blood test  strip Use to test blood sugar daily and PRN 100 each 12  . Lancets (ONETOUCH ULTRASOFT) lancets Use to test blood sugar daily and PRN 100 each 12  . metFORMIN (GLUCOPHAGE) 850 MG tablet Take 1 tablet (850 mg total) by mouth 2 (two) times daily. 180 tablet 1  . simvastatin (ZOCOR) 40 MG tablet TAKE 1 TABLET BY MOUTH AT BEDTIME. 90 tablet 1  . zolpidem (AMBIEN) 5 MG tablet TAKE 1 TABLET BY MOUTH AT BEDTIME AS NEEDED FOR SLEEP 31 tablet 2   No current facility-administered medications for this visit.    + Allergies-reviewed and updated Allergies  Allergen Reactions  . Alendronate Sodium     REACTION: upset stomach, joint and muscle pain  . Codeine Sulfate     REACTION: dizziness, "put her to bed"  . Ibandronate Sodium     REACTION: upset stomach, joint and muscle pain    Social History   Social History Narrative   Grew up in [place], moved to [place] around 2005. Widowed 10 years ago of "emboli". Only son is a Company secretary at [employer] so moved close for that reason.       Lives in home with son but has separate portion of home. Eats dinner together at time.  Hobbies: shopping, movies, [place] visiting 2 sisters    Objective: BP 138/68   Pulse (!) 104   Ht 5' 1" (1.549 m)   Wt 136 lb (61.7 kg)   SpO2 97%   BMI 25.70 kg/m  Gen: NAD, resting comfortably HEENT: Mucous membranes are moist. Oropharynx normal Neck: no thyromegaly CV: RRR- right around 100 rate- rare ectopic beat.  no murmurs rubs or gallops Lungs: CTAB no crackles, wheeze, rhonchi Abdomen: soft/nontender/nondistended/normal bowel sounds. No rebound or guarding.  Ext: trace edema Skin: warm, dry Neuro: grossly normal, moves all extremities, PERRLA  Assessment/Plan:  84 y.o. female presenting for annual physical.  Health Maintenance counseling: 1. Anticipatory guidance: Patient counseled regarding regular dental exams -q6 months, eye exams -, wearing seatbelts.  2. Risk factor reduction:   Advised patient of need for regular exercise and diet rich and fruits and vegetables to reduce risk of heart attack and stroke.she is at a healthy weight. Walking in neighborhood- also good for bone health. Walks in house in wintertime.  3. Immunizations/screenings/ancillary studies- discussed shingrix at pharmacy - she declines.  Immunization History  Administered Date(s) Administered  . H1N1 10/11/2008  . Influenza Split 08/06/2011, 08/13/2011, 08/11/2012  . Influenza Whole 08/25/2007, 08/09/2008, 08/01/2009, 07/24/2010  . Influenza, High Dose Seasonal PF 07/16/2016, 07/15/2017  . Influenza,inj,Quad PF,6+ Mos 06/29/2013, 07/05/2014, 07/11/2015  . Pneumococcal Conjugate-13 01/03/2015  . Pneumococcal Polysaccharide-23 11/10/2007  . Td 04/17/2010  . Zoster 04/17/2010   4. Cervical cancer screening-  passed age based screening 5. Breast cancer screening-  Not advised per age based screening- last mammogram - last in 2011. No breast cancer in family 6. Colon cancer screening - passed age based screening, no blood in stool . 03/29/2008 last exam- no further colonoscopy due to age.  7. Osteoporosis screening at 59- noted 2010 and has had osteoporosis. Has been on injection in the past for a few years- no orals. On calcium and vitamin d- would not want to take anything at this point other than calcium/vitamin D. Declines repeat bone density for now- she will let me know if she changes her mind.  71. Sees Dr. Renda Rolls for dermatology  Status of chronic or acute concerns   We discussed she can stop her asa.   Monitor anemia with cbc. Will check tsh with fatigue  Mild edema still- have advised OTC compression stockings - she declines. States minimal issue  Diabetes Type II with retinopathy Diabetes type II- on metformin 876m BID. Sugars starting about 2 months ago has run up to 131-146 range- seemed to be a bad batch of test strips- new batch and cbgs went back down. Retinopathy follows with Dr.  MZigmund Danielwho is retina specialist. Update a1c today and urine microalbumin/creatinine ratio Lab Results  Component Value Date   HGBA1C 6.7 (H) 08/05/2017   HGBA1C 6.8 (H) 01/14/2017   HGBA1C 6.6 (H) 07/16/2016  foot exam can be done at [facility] next visit- also has had podiatry exam  Hyperlipidemia Hyperlipidemia- on simvastatin 438m LDL <70 last check. Update again today  Insomnia Insomnia- uses ambien to help her sleep. Doesn't sleep that great but worse off the ambien. No abnormally vivid dreams, trouble driving next day, or falls  Osteoarthritis of both knees Knee oa- icy freeze and biofreeze help but uses sparingly. Does not want regular medicine. Usually tylenol 8 hour if needs it as well. Tries to avoid nsaids- uses very sparingly if really helping  Essential hypertension S: controlled on  amlodipine 50m76mn the  past. Home #s controlled 124/73 this AM. Amlodipine could contribute to edema BP Readings from Last 3 Encounters:  02/17/18 138/68  08/05/17 122/70  07/15/17 124/70  A/P: We discussed blood pressure goal of <140/90. Continue current meds   Future Appointments  Date Time Provider [place]  07/17/2018  9:00 AM Williemae Area, RN [facility]  09/15/2018  9:15 AM Hayden Pedro, MD [facility] None   Return in about 6 months (around 08/19/2018) for follow up- or sooner if needed.  Lab/Order associations: Encounter for preventative adult health care exam with abnormal findings - Plan: CBC, Comprehensive metabolic panel, Lipid panel, Hemoglobin A1c, Microalbumin / creatinine urine ratio, TSH  Diabetic retinopathy of both eyes associated with type 2 diabetes mellitus, macular edema presence unspecified, unspecified retinopathy severity (HCC)  Type 2 diabetes mellitus with retinopathy, without long-term current use of insulin, macular edema presence unspecified, unspecified laterality, unspecified retinopathy severity ([place]) - Plan: CBC, Comprehensive metabolic  panel, Lipid panel, Hemoglobin A1c, Microalbumin / creatinine urine ratio  CKD (chronic kidney disease), stage II - Plan: CBC, Comprehensive metabolic panel  Hyperlipidemia, unspecified hyperlipidemia type - Plan: Lipid panel  Other fatigue - Plan: TSH  Return precautions advised.  Garret Reddish, MD

## 2018-02-17 NOTE — Assessment & Plan Note (Addendum)
Diabetes type II- on metformin 850mg  BID. Sugars starting about 2 months ago has run up to 131-146 range- seemed to be a bad batch of test strips- new batch and cbgs went back down. Retinopathy follows with Dr. Zigmund Daniel who is retina specialist. Update a1c today and urine microalbumin/creatinine ratio Lab Results  Component Value Date   HGBA1C 6.7 (H) 08/05/2017   HGBA1C 6.8 (H) 01/14/2017   HGBA1C 6.6 (H) 07/16/2016  foot exam can be done at [facility] next visit- also has had podiatry exam

## 2018-02-17 NOTE — Progress Notes (Signed)
Your CBC was largely stable/normal (blood counts, infection fighting cells, platelets).  You had very mild anemia which you have intermittently.  We will monitor at least every 6 to 12 months Your CMET was normal (kidney, liver, and electrolytes, blood sugar)  For someone with diabetes. Your A1c was stable at 6.7 which is great news. The goal is 7 or less. Normal urine diabetes test-No leakage of protein into urine related to diabetes Your cholesterol looks great Your thyroid was normal.

## 2018-02-17 NOTE — Assessment & Plan Note (Signed)
Knee oa- icy freeze and biofreeze help but uses sparingly. Does not want regular medicine. Usually tylenol 8 hour if needs it as well. Tries to avoid nsaids- uses very sparingly if really helping

## 2018-02-17 NOTE — Patient Instructions (Addendum)
Health Maintenance Due  Topic Date Due  . FOOT EXAM - 11/2017 w/ podiatry. Has upcoming appointment Mar 10 2018. [employer]. Also would ask at annual wellness visit for susan to do this.  01/14/2018  . URINE MICROALBUMIN - today 01/14/2018  . HEMOGLOBIN A1C - today 02/03/2018   Please stop by lab before you go Checking thyroid given fatigue  You may stop your aspirin  Would be reasonable to trial a Centrum Silver women's multivitamin.  Ask your pharmacist what your cost would be for shingrix if they have it available. Possibly may be very affordable there.

## 2018-02-17 NOTE — Assessment & Plan Note (Signed)
Hyperlipidemia- on simvastatin 40mg - LDL <70 last check. Update again today

## 2018-02-20 ENCOUNTER — Telehealth: Payer: Self-pay

## 2018-02-20 NOTE — Telephone Encounter (Signed)
Copied from [place] [phone]. Topic: Quick Communication - See Telephone Encounter >> Feb 20, 2018  3:26 PM Vernona Rieger wrote: CRM for notification. See Telephone encounter for: 02/20/18.  Patient would like her results from 4/26. Please call back [phone]

## 2018-03-10 DIAGNOSIS — L602 Onychogryphosis: Secondary | ICD-10-CM | POA: Diagnosis not present

## 2018-03-10 DIAGNOSIS — E1351 Other specified diabetes mellitus with diabetic peripheral angiopathy without gangrene: Secondary | ICD-10-CM | POA: Diagnosis not present

## 2018-03-21 ENCOUNTER — Other Ambulatory Visit: Payer: Self-pay | Admitting: Family Medicine

## 2018-03-21 DIAGNOSIS — R69 Illness, unspecified: Secondary | ICD-10-CM | POA: Diagnosis not present

## 2018-03-21 MED ORDER — ONETOUCH ULTRASOFT LANCETS MISC: 12 refills | Status: DC

## 2018-03-21 MED ORDER — GLUCOSE BLOOD VI STRP: ORAL_STRIP | 12 refills | Status: DC

## 2018-03-21 NOTE — Telephone Encounter (Signed)
See note

## 2018-03-21 NOTE — Telephone Encounter (Signed)
Copied from [place] [id number]. Topic: Quick Communication - Rx Refill/Question >> Mar 21, 2018 10:31 AM Laura Salas wrote: Medication: glucose blood test strip   Lancets (ONETOUCH ULTRASOFT) lancets       Has the patient contacted their pharmacy? Yes.   (Agent: If no, request that the patient contact the pharmacy for the refill.) (Agent: If yes, when and what did the pharmacy advise?)  Preferred Pharmacy (with phone number or street name):   Agent: Please be advised that RX refills may take up to 3 business days. We ask that you follow-up with your pharmacy.

## 2018-03-21 NOTE — Telephone Encounter (Signed)
Glucose test strips and Lancets Last OV:02/17/18 Last refill:02/19/17 OIT:GPQDIY Pharmacy: [facility], [place] [phone] (Phone) [phone] (Fax)

## 2018-05-08 ENCOUNTER — Other Ambulatory Visit: Payer: Self-pay | Admitting: Family Medicine

## 2018-06-09 DIAGNOSIS — L602 Onychogryphosis: Secondary | ICD-10-CM | POA: Diagnosis not present

## 2018-06-09 DIAGNOSIS — E1351 Other specified diabetes mellitus with diabetic peripheral angiopathy without gangrene: Secondary | ICD-10-CM | POA: Diagnosis not present

## 2018-06-09 DIAGNOSIS — L84 Corns and callosities: Secondary | ICD-10-CM | POA: Diagnosis not present

## 2018-07-17 ENCOUNTER — Ambulatory Visit: Payer: Medicare HMO | Admitting: *Deleted

## 2018-07-28 ENCOUNTER — Ambulatory Visit (INDEPENDENT_AMBULATORY_CARE_PROVIDER_SITE_OTHER): Payer: Medicare HMO | Admitting: *Deleted

## 2018-07-28 VITALS — BP 118/70 | HR 94 | Resp 16 | Ht 61.0 in | Wt 135.4 lb

## 2018-07-28 DIAGNOSIS — Z0000 Encounter for general adult medical examination without abnormal findings: Secondary | ICD-10-CM | POA: Diagnosis not present

## 2018-07-28 DIAGNOSIS — Z23 Encounter for immunization: Secondary | ICD-10-CM | POA: Diagnosis not present

## 2018-07-28 NOTE — Progress Notes (Signed)
I have reviewed and agree with note, evaluation, plan.   Gregoire Bennis, MD  

## 2018-07-28 NOTE — Progress Notes (Signed)
Subjective:   Laura Salas is a 84 y.o. female who presents for Medicare Annual (Subsequent) preventive examination.  Lives in shared home with son and his family. Has her own living space with private entrance.   Review of Systems:  No ROS.  Medicare Wellness Visit. Additional risk factors are reflected in the social history.  Sleeps 6 hrs/night. Does not nap during the day.   Cardiac Risk Factors include: advanced age (>35mn, >>15women);diabetes mellitus;dyslipidemia;hypertension     Objective:     Vitals: BP 118/70   Pulse 94   Resp 16   Ht '5\' 1"'$  (1.549 m)   Wt 135 lb 6.4 oz (61.4 kg)   SpO2 96%   BMI 25.58 kg/m   Body mass index is 25.58 kg/m.  Diabetic Foot Exam - Simple   Simple Foot Form Diabetic Foot exam was performed with the following findings:  Yes 07/28/2018 10:59 AM  Visual Inspection No deformities, no ulcerations, no other skin breakdown bilaterally:  Yes Sensation Testing Intact to touch and monofilament testing bilaterally:  Yes Pulse Check Posterior Tibialis and Dorsalis pulse intact bilaterally:  Yes Comments     Advanced Directives 07/28/2018 07/15/2017 06/24/2013  Does Patient Have a Medical Advance Directive? Yes Yes Patient does not have advance directive  Type of Advance Directive Living will;Healthcare Power of Attorney - -  Does patient want to make changes to medical advance directive? No - Patient declined - -  Copy of HLaresin Chart? No - copy requested - -    Tobacco Social History   Tobacco Use  Smoking Status Never Smoker  Smokeless Tobacco Never Used     Counseling given: Not Answered  Past Medical History:  Diagnosis Date  . Depression   . Diabetes mellitus   . Hyperlipidemia   . Hypertension   . Osteoporosis    Past Surgical History:  Procedure Laterality Date  . CARPAL TUNNEL RELEASE     left  . CATARACT EXTRACTION    . CESAREAN SECTION     Family History  Problem Relation Age of  Onset  . Cancer Mother        colon  . Heart attack Father   . Heart attack Brother   . Cancer Brother        stomach  . Other Sister        died at 936  Social History   Socioeconomic History  . Marital status: Widowed    Spouse name: Not on file  . Number of children: Not on file  . Years of education: Not on file  . 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 Financial resource strain: Not on file  . Food insecurity:    Worry: Not on file    Inability: Not on file  . Transportation needs:    Medical: Not on file    Non-medical: Not on file  Tobacco Use  . Smoking status: Never Smoker  . Smokeless tobacco: Never Used  Substance and Sexual Activity  . Alcohol use: No  . Drug use: No  . Sexual activity: Not on file  Lifestyle  . Physical activity:    Days per week: Not on file    Minutes per session: Not on file  . Stress: Not on file  Relationships  . Social connections:    Talks on phone: Not on file  Gets together: Not on file    Attends religious service: Not on file    Active member of club or organization: Not on file    Attends meetings of clubs or organizations: Not on file    Relationship status: Not on file  Other Topics Concern  . Not on file  Social History Narrative   Grew up in [place], moved to [place] around 2005. Widowed 10 years ago of "emboli". Only son is a Company secretary at [employer] so moved close for that reason.       Lives in home with son but has separate portion of home. Eats dinner together at time.       Hobbies: shopping, movies, [place] visiting 2 sisters    Outpatient Encounter Medications as of 07/28/2018  Medication Sig  . amLODipine (NORVASC) 5 MG tablet Take 1 tablet (5 mg total) by mouth daily.  . blood glucose meter kit and supplies KIT Dispense based on patient and insurance preference. Use to check blood sugar  daily. One Touch glucometer E11.9  . glucose blood test strip Use to test blood sugar daily and PRN  . Lancets (ONETOUCH ULTRASOFT) lancets Use to test blood sugar daily and PRN  . metFORMIN (GLUCOPHAGE) 850 MG tablet Take 1 tablet (850 mg total) by mouth 2 (two) times daily.  . simvastatin (ZOCOR) 40 MG tablet TAKE 1 TABLET BY MOUTH AT BEDTIME.  Marland Kitchen zolpidem (AMBIEN) 5 MG tablet TAKE 1 TABLET BY MOUTH AT BEDTIME AS NEEDED FOR SLEEP   No facility-administered encounter medications on file as of 07/28/2018.     Activities of Daily Living In your present state of health, do you have any difficulty performing the following activities: 07/28/2018  Hearing? N  Vision? N  Difficulty concentrating or making decisions? N  Walking or climbing stairs? N  Dressing or bathing? N  Doing errands, shopping? N  Preparing Food and eating ? N  Using the Toilet? N  In the past six months, have you accidently leaked urine? N  Do you have problems with loss of bowel control? N  Managing your Medications? N  Managing your Finances? N  Housekeeping or managing your Housekeeping? N  Some recent data might be hidden    Patient Care Team: Marin Olp, MD as PCP - General (Family Medicine) Hayden Pedro, MD as Consulting Physician (Ophthalmology) Netta Cedars, MD as Consulting Physician (Orthopedic Surgery) [place], Jennefer Bravo, MD as Referring Physician (Dermatology) Rosemary Holms, DPM as Consulting Physician (Podiatry)    Assessment:   This is a routine wellness examination for Laura Salas.  Exercise Activities and Dietary recommendations Current Exercise Habits: Home exercise routine(Patient states that she does a lot of housework.), Type of exercise: walking, Time (Minutes): 25, Frequency (Times/Week): 3, Weekly Exercise (Minutes/Week): 75, Intensity: Moderate, Exercise limited by: None identified  Eats 3 meals/day. Drinks about 40 oz water/day. Drinks 1 glass tea/day. Eats out very seldom.    Breakfast: Bran muffin or pancakes. 1 cup coffee. Lunch: 1/2 sandwich or small salad. Dinner: Meat and vegetables or salad.   Goals    . patient     Exercise as much as she can!        Fall Risk Fall Risk  07/28/2018 07/15/2017 01/14/2017 01/09/2016 11/01/2014  Falls in the past year? No No No No No  Comment - 4 to5 years ago; fell due to dehydration - - -  Number falls in past yr: - - - - -  Risk for fall  due to : - Impaired mobility - - -  Risk for fall due to: Comment - at risk due to osteoporosis  - - -   Depression Screen PHQ 2/9 Scores 07/28/2018 07/15/2017 01/14/2017 01/09/2016  PHQ - 2 Score 0 0 0 0  PHQ- 9 Score 0 - - -  PHQ 2 and PHQ9 completed. Score 0.   Cognitive Function MMSE - Mini Mental State Exam 07/15/2017  Not completed: (No Data)  Ad8 score reviewed for issues:  Issues making decisions:no  Less interest in hobbies / activities:no  Repeats questions, stories (family complaining):no  Trouble using ordinary gadgets (microwave, computer, phone):no  Forgets the month or year: no  Mismanaging finances: no  Remembering appts:no  Daily problems with thinking and/or memory:no Ad8 score is=0 Stays very social and walks. She has lost some vision so she is unable to complete puzzle books.       Immunization History  Administered Date(s) Administered  . H1N1 10/11/2008  . Influenza Split 08/06/2011, 08/13/2011, 08/11/2012  . Influenza Whole 08/25/2007, 08/09/2008, 08/01/2009, 07/24/2010  . Influenza, High Dose Seasonal PF 07/16/2016, 07/15/2017, 07/28/2018  . Influenza,inj,Quad PF,6+ Mos 06/29/2013, 07/05/2014, 07/11/2015  . Pneumococcal Conjugate-13 01/03/2015  . Pneumococcal Polysaccharide-23 11/10/2007  . Td 04/17/2010  . Zoster 04/17/2010   Screening Tests Health Maintenance  Topic Date Due  . HEMOGLOBIN A1C  08/19/2018  . OPHTHALMOLOGY EXAM  09/09/2018  . URINE MICROALBUMIN  02/18/2019  . FOOT EXAM  07/29/2019  . TETANUS/TDAP  04/17/2020  .  INFLUENZA VACCINE  Completed  . DEXA SCAN  Completed  . PNA vac Low Risk Adult  Completed      Plan:   Follow up with PCP as directed.  I have personally reviewed and noted the following in the patient's chart:   . Medical and social history . Use of alcohol, tobacco or illicit drugs  . Current medications and supplements . Functional ability and status . Nutritional status . Physical activity . Advanced directives . List of other physicians . Vitals . Screenings to include cognitive, depression, and falls . Referrals and appointments  In addition, I have reviewed and discussed with patient certain preventive protocols, quality metrics, and best practice recommendations. A written personalized care plan for preventive services as well as general preventive health recommendations were provided to patient.     Laura Area, RN  07/28/2018

## 2018-07-28 NOTE — Progress Notes (Signed)
PCP notes:   Health maintenance: Foot exam: completed today. Flu: Received today.    Abnormal screenings: None.   Patient concerns: None.   Nurse concerns: None.   Next PCP appt: 08/25/18

## 2018-07-28 NOTE — Patient Instructions (Addendum)
Laura Salas , Thank you for taking time to come for your Medicare Wellness Visit. I appreciate your ongoing commitment to your health goals. Please review the following plan we discussed and let me know if I can assist you in the future.   These are the goals we discussed: Goals    . patient     Exercise as much as she can!        This is a list of the screening recommended for you and due dates:  Health Maintenance  Topic Date Due  . Complete foot exam   01/14/2018  . Flu Shot  05/25/2018  . Hemoglobin A1C  08/19/2018  . Eye exam for diabetics  09/09/2018  . Urine Protein Check  02/18/2019  . Tetanus Vaccine  04/17/2020  . DEXA scan (bone density measurement)  Completed  . Pneumonia vaccines  Completed   Preventive Care for Adults  A healthy lifestyle and preventive care can promote health and wellness. Preventive health guidelines for adults include the following key practices.  . A routine yearly physical is a good way to check with your health care provider about your health and preventive screening. It is a chance to share any concerns and updates on your health and to receive a thorough exam.  . Visit your dentist for a routine exam and preventive care every 6 months. Brush your teeth twice a day and floss once a day. Good oral hygiene prevents tooth decay and gum disease.  . The frequency of eye exams is based on your age, health, family medical history, use  of contact lenses, and other factors. Follow your health care provider's recommendations for frequency of eye exams.  . Eat a healthy diet. Foods like vegetables, fruits, whole grains, low-fat dairy products, and lean protein foods contain the nutrients you need without too many calories. Decrease your intake of foods high in solid fats, added sugars, and salt. Eat the right amount of calories for you. Get information about a proper diet from your health care provider, if necessary.  . Regular physical exercise is  one of the most important things you can do for your health. Most adults should get at least 150 minutes of moderate-intensity exercise (any activity that increases your heart rate and causes you to sweat) each week. In addition, most adults need muscle-strengthening exercises on 2 or more days a week.  Silver Sneakers may be a benefit available to you. To determine eligibility, you may visit the website: www.silversneakers.com or contact program at 709-624-8384 Mon-Fri between 8AM-8PM.   . Maintain a healthy weight. The body mass index (BMI) is a screening tool to identify possible weight problems. It provides an estimate of body fat based on height and weight. Your health care provider can find your BMI and can help you achieve or maintain a healthy weight.   For adults 20 years and older: ? A BMI below 18.5 is considered underweight. ? A BMI of 18.5 to 24.9 is normal. ? A BMI of 25 to 29.9 is considered overweight. ? A BMI of 30 and above is considered obese.   . Maintain normal blood lipids and cholesterol levels by exercising and minimizing your intake of saturated fat. Eat a balanced diet with plenty of fruit and vegetables. Blood tests for lipids and cholesterol should begin at age 32 and be repeated every 5 years. If your lipid or cholesterol levels are high, you are over 50, or you are at high risk for heart disease,  you may need your cholesterol levels checked more frequently. Ongoing high lipid and cholesterol levels should be treated with medicines if diet and exercise are not working.  . If you smoke, find out from your health care provider how to quit. If you do not use tobacco, please do not start.  . If you choose to drink alcohol, please do not consume more than 2 drinks per day. One drink is considered to be 12 ounces (355 mL) of beer, 5 ounces (148 mL) of wine, or 1.5 ounces (44 mL) of liquor.  . If you are 56-94 years old, ask your health care provider if you should take  aspirin to prevent strokes.  . Use sunscreen. Apply sunscreen liberally and repeatedly throughout the day. You should seek shade when your shadow is shorter than you. Protect yourself by wearing long sleeves, pants, a wide-brimmed hat, and sunglasses year round, whenever you are outdoors.  . Once a month, do a whole body skin exam, using a mirror to look at the skin on your back. Tell your health care provider of new moles, moles that have irregular borders, moles that are larger than a pencil eraser, or moles that have changed in shape or color.

## 2018-07-30 ENCOUNTER — Other Ambulatory Visit: Payer: Self-pay | Admitting: Family Medicine

## 2018-08-02 ENCOUNTER — Other Ambulatory Visit: Payer: Self-pay | Admitting: Family Medicine

## 2018-08-03 ENCOUNTER — Other Ambulatory Visit: Payer: Self-pay | Admitting: Family Medicine

## 2018-08-12 DIAGNOSIS — R69 Illness, unspecified: Secondary | ICD-10-CM | POA: Diagnosis not present

## 2018-08-25 ENCOUNTER — Ambulatory Visit (INDEPENDENT_AMBULATORY_CARE_PROVIDER_SITE_OTHER): Payer: Medicare HMO | Admitting: Family Medicine

## 2018-08-25 ENCOUNTER — Encounter: Payer: Self-pay | Admitting: Family Medicine

## 2018-08-25 VITALS — BP 132/74 | HR 97 | Temp 97.8°F | Ht 61.0 in | Wt 136.0 lb

## 2018-08-25 DIAGNOSIS — E11319 Type 2 diabetes mellitus with unspecified diabetic retinopathy without macular edema: Secondary | ICD-10-CM

## 2018-08-25 DIAGNOSIS — I10 Essential (primary) hypertension: Secondary | ICD-10-CM

## 2018-08-25 DIAGNOSIS — E785 Hyperlipidemia, unspecified: Secondary | ICD-10-CM | POA: Diagnosis not present

## 2018-08-25 DIAGNOSIS — Z6826 Body mass index (BMI) 26.0-26.9, adult: Secondary | ICD-10-CM | POA: Diagnosis not present

## 2018-08-25 DIAGNOSIS — G4700 Insomnia, unspecified: Secondary | ICD-10-CM | POA: Diagnosis not present

## 2018-08-25 LAB — CBC
HCT: 35.4 % — ABNORMAL LOW (ref 36.0–46.0)
HEMOGLOBIN: 11.6 g/dL — AB (ref 12.0–15.0)
MCHC: 32.8 g/dL (ref 30.0–36.0)
MCV: 92.8 fl (ref 78.0–100.0)
PLATELETS: 244 10*3/uL (ref 150.0–400.0)
RBC: 3.82 Mil/uL — ABNORMAL LOW (ref 3.87–5.11)
RDW: 14.2 % (ref 11.5–15.5)
WBC: 5.8 10*3/uL (ref 4.0–10.5)

## 2018-08-25 LAB — BASIC METABOLIC PANEL
BUN: 20 mg/dL (ref 6–23)
CO2: 26 meq/L (ref 19–32)
CREATININE: 0.89 mg/dL (ref 0.40–1.20)
Calcium: 9.7 mg/dL (ref 8.4–10.5)
Chloride: 106 mEq/L (ref 96–112)
GFR: 64.01 mL/min (ref >60.00)
Glucose, Bld: 121 mg/dL — ABNORMAL HIGH (ref 70–99)
Potassium: 4.3 mEq/L (ref 3.5–5.1)
Sodium: 142 mEq/L (ref 135–145)

## 2018-08-25 LAB — HEMOGLOBIN A1C: HEMOGLOBIN A1C: 6.6 % — AB (ref 4.6–6.5)

## 2018-08-25 MED ORDER — ZOLPIDEM TARTRATE ER 6.25 MG PO TBCR: 6.2500 mg | EXTENDED_RELEASE_TABLET | Freq: Every evening | ORAL | 0 refills | Status: DC | PRN

## 2018-08-25 MED ORDER — DICLOFENAC SODIUM 1 % TD GEL: 2.0000 g | Freq: Four times a day (QID) | TRANSDERMAL | 5 refills | Status: DC

## 2018-08-25 NOTE — Assessment & Plan Note (Signed)
S:  controlled on metformin 850 mg twice daily.  Follows up for her retinopathy with Dr. Matthews/retina specialist. Lab Results  Component Value Date   HGBA1C 6.7 (H) 02/17/2018   HGBA1C 6.7 (H) 08/05/2017   HGBA1C 6.8 (H) 01/14/2017   A/P: Update hemoglobin A1c.  Has been doing well.  Likely continue current medications Eyes- has upcoming appointment with Dr. Zigmund Daniel

## 2018-08-25 NOTE — Progress Notes (Signed)
Subjective:  Laura Salas is a 84 y.o. year old very pleasant female patient who presents for/with See problem oriented charting ROS- No chest pain or shortness of breath. No headache or blurry vision. No hypoglycemia.    Past Medical History-  Patient Active Problem List   Diagnosis Date Noted  . Diabetic retinopathy ([place]) 07/11/2015    Priority: High  . Diabetes Type II with retinopathy 05/15/2007    Priority: High  . PAC (premature atrial contraction) 07/11/2015    Priority: Medium  . CKD (chronic kidney disease), stage II 07/05/2014    Priority: Medium  . Insomnia 06/14/2008    Priority: Medium  . Hyperlipidemia 05/15/2007    Priority: Medium  . Essential hypertension 05/15/2007    Priority: Medium  . Osteoporosis 12/12/2009    Priority: Low  . INTENTION TREMOR 10/11/2008    Priority: Low  . DEPRESSION 06/14/2008    Priority: Low  . ANEMIA, OTHER UNSPEC 01/03/2008    Priority: Low  . Osteoarthritis of both knees 02/17/2018    Medications- reviewed and updated Current Outpatient Medications  Medication Sig Dispense Refill  . amLODipine (NORVASC) 5 MG tablet Take 1 tablet (5 mg total) by mouth daily. 90 tablet 3  . blood glucose meter kit and supplies KIT Dispense based on patient and insurance preference. Use to check blood sugar daily. One Touch glucometer E11.9 1 each 0  . glucose blood test strip Use to test blood sugar daily and PRN 100 each 12  . Lancets (ONETOUCH ULTRASOFT) lancets Use to test blood sugar daily and PRN 100 each 12  . metFORMIN (GLUCOPHAGE) 850 MG tablet TAKE 1 TABLET BY MOUTH 2 TIMES DAILY. 60 tablet 0  . simvastatin (ZOCOR) 40 MG tablet TAKE 1 TABLET AT BEDTIME 90 tablet 1  . zolpidem (AMBIEN) 5 MG tablet TAKE 1 TABLET BY MOUTH AT BEDTIME AS NEEDED FOR SLEEP 31 tablet 5     Objective: BP 132/74 (BP Location: Left Arm, Patient Position: Sitting, Cuff Size: Large)   Pulse 97   Temp 97.8 F (36.6 C) (Oral)   Ht '5\' 1"'$  (1.549 m)   Wt 136 lb  (61.7 kg)   SpO2 98%   BMI 25.70 kg/m  Gen: NAD, resting comfortably CV: RRR no murmurs rubs or gallops Lungs: CTAB no crackles, wheeze, rhonchi Abdomen: soft/nontender Ext: no edema Skin: warm, dry Neuro: grossly normal, moves all extremities  Assessment/Plan:  Other notes: 1.  Monitor anemia with CBC. 2. Still with pains in knees and legs- still on icy hot and biofreeze. Using 8 hour tylenol and helps some. Uses sparing ibuprofen as worries about her kidneys. Getting up from a position can be difficult.   Hypertension S: controlled on amlodipine 5 mg BP Readings from Last 3 Encounters:  08/25/18 132/74  07/28/18 118/70  02/17/18 138/68  A/P: We discussed blood pressure goal of <140/90. Continue current meds   Diabetes Type II with retinopathy S:  controlled on metformin 850 mg twice daily.  Follows up for her retinopathy with Dr. Matthews/retina specialist. Lab Results  Component Value Date   HGBA1C 6.7 (H) 02/17/2018   HGBA1C 6.7 (H) 08/05/2017   HGBA1C 6.8 (H) 01/14/2017   A/P: Update hemoglobin A1c.  Has been doing well.  Likely continue current medications Eyes- has upcoming appointment with Dr. Zigmund Daniel  Hyperlipidemia S:  controlled on simvastatin 40 mg with last LDL under 70 in April  A/P: Stable-continue current medications.  Could consider switch to atorvastatin given she is  also on amlodipine but she has tolerated this regimen well so do not want to make any changes at present    Insomnia S: Continues to takeAmbien- gets about 5 hours of sleep (she gets frustrated because wakes up around 3 30 AM and cant go back to sleep).  Does not sleep much at all off Ambien.  Still has issues on medication but at least allow some rest.  No trouble driving in the daytime, falls, vivid dreams. A/P: we will trial extended release 6.'25mg'$  ambien due to sleep maintenance issues. Not sure if this will be covered- if not stick with 5 mg version    Future Appointments  Date Time  Provider [place]  09/15/2018  9:15 AM Hayden Pedro, MD [facility] None  08/03/2019 10:00 AM [facility] COACH [facility]   No follow-ups on file.  Lab/Order associations: Type 2 diabetes mellitus with retinopathy, without long-term current use of insulin, macular edema presence unspecified, unspecified laterality, unspecified retinopathy severity ([place]) - Plan: CBC, Basic metabolic panel, Hemoglobin A1c  Diabetic retinopathy of both eyes associated with type 2 diabetes mellitus, macular edema presence unspecified, unspecified retinopathy severity ([facility])  Hyperlipidemia, unspecified hyperlipidemia type - Plan: CBC, Basic metabolic panel  Essential hypertension - Plan: CBC, Basic metabolic panel  Insomnia, unspecified type  Meds ordered this encounter  Medications  . diclofenac sodium (VOLTAREN) 1 % GEL    Sig: Apply 2 g topically 4 (four) times daily.    Dispense:  100 g    Refill:  5  . zolpidem (AMBIEN CR) 6.25 MG CR tablet    Sig: Take 1 tablet (6.25 mg total) by mouth at bedtime as needed for sleep (1 month trial instead of ambien '5mg'$ - only if affordable).    Dispense:  30 tablet    Refill:  0    Return precautions advised.  Garret Reddish, MD

## 2018-08-25 NOTE — Assessment & Plan Note (Signed)
S:  controlled on simvastatin 40 mg with last LDL under 70 in April  A/P: Stable-continue current medications.  Could consider switch to atorvastatin given she is also on amlodipine but she has tolerated this regimen well so do not want to make any changes at present

## 2018-08-25 NOTE — Patient Instructions (Addendum)
Please stop by lab before you go  Trial voltaren gel for arthritic pains  Trial extended release ambien if affordable. Only take extended release version or regular 5 mg version one per night- do not take these together. I did not take the regular 5 mg ambien off your med list yet since we are not sure we are going to keep you off that long term.

## 2018-08-25 NOTE — Assessment & Plan Note (Signed)
S: Continues to [place]- gets about 5 hours of sleep (she gets frustrated because wakes up around 3 30 AM and cant go back to sleep).  Does not sleep much at all off Ambien.  Still has issues on medication but at least allow some rest.  No trouble driving in the daytime, falls, vivid dreams. A/P: we will trial extended release 6.25mg  ambien due to sleep maintenance issues. Not sure if this will be covered- if not stick with 5 mg version

## 2018-08-26 ENCOUNTER — Other Ambulatory Visit: Payer: Self-pay | Admitting: Family Medicine

## 2018-08-29 ENCOUNTER — Telehealth: Payer: Self-pay | Admitting: *Deleted

## 2018-08-29 NOTE — Telephone Encounter (Signed)
Patient called for her results and she has been notified of them- patient wanted to let the office know that she is needing prior authorization on her Voltaren gel for her insurance. Can someone please address that for her so she can get that covered.

## 2018-08-30 NOTE — Telephone Encounter (Signed)
This request has received a Favorable outcome. Please note any additional information provided by [facility] Part D at the bottom of this request.

## 2018-08-30 NOTE — Telephone Encounter (Signed)
Spoke with pt, she has tried Acetaminophen, IBU, and Aspercreme in the past with no relief.   PA initiated via [email or web address]

## 2018-08-31 ENCOUNTER — Telehealth: Payer: Self-pay

## 2018-08-31 NOTE — Telephone Encounter (Signed)
Received a notice from [employer] that diclofenac sodium (VOLTAREN) 1 % GEL had been approved from 10/23/2017-10/25/2019  Referral number [id number]

## 2018-08-31 NOTE — Telephone Encounter (Signed)
Called pt and left VM to call the office.  

## 2018-08-31 NOTE — Telephone Encounter (Signed)
Pt returned call and was advised that Voltaren has been approved.

## 2018-09-08 DIAGNOSIS — L84 Corns and callosities: Secondary | ICD-10-CM | POA: Diagnosis not present

## 2018-09-08 DIAGNOSIS — L602 Onychogryphosis: Secondary | ICD-10-CM | POA: Diagnosis not present

## 2018-09-08 DIAGNOSIS — E1351 Other specified diabetes mellitus with diabetic peripheral angiopathy without gangrene: Secondary | ICD-10-CM | POA: Diagnosis not present

## 2018-09-15 ENCOUNTER — Encounter (INDEPENDENT_AMBULATORY_CARE_PROVIDER_SITE_OTHER): Payer: Medicare HMO | Admitting: Ophthalmology

## 2018-09-15 DIAGNOSIS — H43813 Vitreous degeneration, bilateral: Secondary | ICD-10-CM | POA: Diagnosis not present

## 2018-09-15 DIAGNOSIS — I10 Essential (primary) hypertension: Secondary | ICD-10-CM | POA: Diagnosis not present

## 2018-09-15 DIAGNOSIS — H35033 Hypertensive retinopathy, bilateral: Secondary | ICD-10-CM

## 2018-09-15 DIAGNOSIS — E11319 Type 2 diabetes mellitus with unspecified diabetic retinopathy without macular edema: Secondary | ICD-10-CM

## 2018-09-15 DIAGNOSIS — E113593 Type 2 diabetes mellitus with proliferative diabetic retinopathy without macular edema, bilateral: Secondary | ICD-10-CM | POA: Diagnosis not present

## 2018-09-15 DIAGNOSIS — H353134 Nonexudative age-related macular degeneration, bilateral, advanced atrophic with subfoveal involvement: Secondary | ICD-10-CM | POA: Diagnosis not present

## 2018-09-15 LAB — HM DIABETES EYE EXAM

## 2018-09-20 ENCOUNTER — Encounter: Payer: Self-pay | Admitting: Family Medicine

## 2018-12-08 DIAGNOSIS — E1351 Other specified diabetes mellitus with diabetic peripheral angiopathy without gangrene: Secondary | ICD-10-CM | POA: Diagnosis not present

## 2018-12-08 DIAGNOSIS — L602 Onychogryphosis: Secondary | ICD-10-CM | POA: Diagnosis not present

## 2018-12-08 DIAGNOSIS — L84 Corns and callosities: Secondary | ICD-10-CM | POA: Diagnosis not present

## 2018-12-21 DIAGNOSIS — R69 Illness, unspecified: Secondary | ICD-10-CM | POA: Diagnosis not present

## 2019-02-12 ENCOUNTER — Other Ambulatory Visit: Payer: Self-pay | Admitting: Family Medicine

## 2019-02-12 NOTE — Telephone Encounter (Signed)
Pt requesting refill Zolpidem, appt 5/8 with you.

## 2019-02-23 DIAGNOSIS — L602 Onychogryphosis: Secondary | ICD-10-CM | POA: Diagnosis not present

## 2019-02-23 DIAGNOSIS — E1351 Other specified diabetes mellitus with diabetic peripheral angiopathy without gangrene: Secondary | ICD-10-CM | POA: Diagnosis not present

## 2019-02-23 DIAGNOSIS — L84 Corns and callosities: Secondary | ICD-10-CM | POA: Diagnosis not present

## 2019-03-02 ENCOUNTER — Encounter: Payer: Medicare HMO | Admitting: Family Medicine

## 2019-03-12 ENCOUNTER — Other Ambulatory Visit: Payer: Self-pay | Admitting: Family Medicine

## 2019-03-12 NOTE — Telephone Encounter (Signed)
Last OV 08/25/18 Last refill 02/12/19 #31/0 Next OV 07/06/19

## 2019-03-12 NOTE — Telephone Encounter (Signed)
Patient has diabetes- blood sees this as an opportunity to either do a phone or video visit and do the refill at that time-so we can also discuss her diabetes

## 2019-03-13 ENCOUNTER — Ambulatory Visit (INDEPENDENT_AMBULATORY_CARE_PROVIDER_SITE_OTHER): Payer: Medicare HMO | Admitting: Family Medicine

## 2019-03-13 ENCOUNTER — Encounter: Payer: Self-pay | Admitting: Family Medicine

## 2019-03-13 VITALS — BP 122/74

## 2019-03-13 DIAGNOSIS — M170 Bilateral primary osteoarthritis of knee: Secondary | ICD-10-CM

## 2019-03-13 DIAGNOSIS — I10 Essential (primary) hypertension: Secondary | ICD-10-CM | POA: Diagnosis not present

## 2019-03-13 DIAGNOSIS — E11319 Type 2 diabetes mellitus with unspecified diabetic retinopathy without macular edema: Secondary | ICD-10-CM

## 2019-03-13 DIAGNOSIS — G4700 Insomnia, unspecified: Secondary | ICD-10-CM | POA: Diagnosis not present

## 2019-03-13 DIAGNOSIS — E785 Hyperlipidemia, unspecified: Secondary | ICD-10-CM

## 2019-03-13 MED ORDER — ZOLPIDEM TARTRATE 5 MG PO TABS: 5.0000 mg | ORAL_TABLET | Freq: Every evening | ORAL | 5 refills | Status: DC | PRN

## 2019-03-13 NOTE — Progress Notes (Signed)
Phone [phone]   Subjective:  Virtual visit via phonenote Chief Complaint  Patient presents with  . Insomnia   This visit type was conducted due to national recommendations for restrictions regarding the COVID-19 Pandemic (e.g. social distancing).  This format is felt to be most appropriate for this patient at this time balancing risks to patient and risks to population by having him in for in person visit.  All issues noted in this document were discussed and addressed.  No physical exam was performed (except for noted visual exam or audio findings with Telehealth visits).  The patient has consented to conduct a Telehealth visit and understands insurance will be billed.   Our team/I connected with Laura Salas at  3:00 PM EDT by phone (patient did not have equipment for webex) and verified that I am speaking with the correct person using two identifiers.  Location patient: Home-[facility] Location provider:  [facility], office Persons participating in the virtual visit:  patient  Time on phone: 14 minutes Counseling provided about being stuck at home with covid 19  Our team/I discussed the limitations of evaluation and management by telemedicine and the availability of in person appointments. In light of current covid-19 pandemic, patient also understands that we are trying to protect them by minimizing in office contact if at all possible.  The patient expressed consent for telemedicine visit and agreed to proceed. Patient understands insurance will be billed.   ROS- No fever, chills, cough, shortness of breath, new body aches, sore throat, or loss of taste or smell   Past Medical History-  Patient Active Problem List   Diagnosis Date Noted  . Diabetic retinopathy ([place]) 07/11/2015    Priority: High  . Diabetes Type II with retinopathy 05/15/2007    Priority: High  . PAC (premature atrial contraction) 07/11/2015    Priority: Medium  . CKD (chronic kidney disease), stage II 07/05/2014     Priority: Medium  . Insomnia 06/14/2008    Priority: Medium  . Hyperlipidemia 05/15/2007    Priority: Medium  . Essential hypertension 05/15/2007    Priority: Medium  . Osteoporosis 12/12/2009    Priority: Low  . INTENTION TREMOR 10/11/2008    Priority: Low  . DEPRESSION 06/14/2008    Priority: Low  . ANEMIA, OTHER UNSPEC 01/03/2008    Priority: Low  . Osteoarthritis of both knees 02/17/2018    Medications- reviewed and updated Current Outpatient Medications  Medication Sig Dispense Refill  . amLODipine (NORVASC) 5 MG tablet Take 1 tablet (5 mg total) by mouth daily. 90 tablet 3  . blood glucose meter kit and supplies KIT Dispense based on patient and insurance preference. Use to check blood sugar daily. One Touch glucometer E11.9 1 each 0  . diclofenac sodium (VOLTAREN) 1 % GEL Apply 2 g topically 4 (four) times daily. 100 g 5  . glucose blood test strip Use to test blood sugar daily and PRN 100 each 12  . Lancets (ONETOUCH ULTRASOFT) lancets Use to test blood sugar daily and PRN 100 each 12  . metFORMIN (GLUCOPHAGE) 850 MG tablet TAKE 1 TABLET BY MOUTH TWICE A DAY 60 tablet 5  . simvastatin (ZOCOR) 40 MG tablet TAKE 1 TABLET AT BEDTIME 90 tablet 1  . zolpidem (AMBIEN) 5 MG tablet Take 1 tablet (5 mg total) by mouth at bedtime as needed. for sleep 31 tablet 5   No current facility-administered medications for this visit.      Objective:  BP 122/74  self reported  vitals  Nonlabored voice, normal speech     Assessment and Plan   # Diabetes with retinopathy S:  controlled on metformin 850 mg twice a day. Follows with Dr. Zigmund Daniel for retinopathy- sees him each december CBGs- this morning was 104- that's pretty typical- highest she has seen is 110 Exercise and diet- doing some walking in neighborhood and walked at [employer] Value Date   HGBA1C 6.6 (H) 08/25/2018   HGBA1C 6.7 (H) 02/17/2018   HGBA1C 6.7 (H) 08/05/2017   A/P: likely stable-  continue current meds- we opted to defer a1c and labs until September visit - continue regular follow up with optho  #hyperlipidemia S:  controlled on simvastatin '40mg'$ - with LDL under 70 Lab Results  Component Value Date   CHOL 145 02/17/2018   HDL 66.80 02/17/2018   LDLCALC 62 02/17/2018   LDLDIRECT 61.0 01/03/2015   TRIG 78.0 02/17/2018   CHOLHDL 2 02/17/2018   A/P: likely stable- recheck lipids in September. Simvastatin slightly high given she is on amlodipine but has done well so making no changes.   # insomnia S:continues on ambien and only able to get 5 hours of sleep- doesn't sleep much at all without it   She tried 5 days of ambien XR- couldn't get to sleep and didn't think it extended sleep.  A/P: doing well on ambien- no falls or oversedation- refilled mediation   #hypertension S: controlled on  amlodipine 5 mg BP Readings from Last 3 Encounters:  03/13/19 122/74  08/25/18 132/74  07/28/18 118/70  A/P:  Stable. Continue current medications.   Other notes: 1.will defer CBC for anemia monitoring until September 2. [name]. Voltaren gel also has been helpful for her- we were trying to use this over ibuprofen.   Full bloodwork at follow up- possible CPE Future Appointments  Date Time Provider [place]  03/13/2019  3:00 PM Marin Olp, MD [facility]  07/06/2019  1:00 PM Marin Olp, MD [facility]  08/03/2019 10:00 AM [facility] HEALTH COACH [facility]  09/14/2019  9:15 AM Hayden Pedro, MD [facility] None   Lab/Order associations: Type 2 diabetes mellitus with retinopathy, without long-term current use of insulin, macular edema presence unspecified, unspecified laterality, unspecified retinopathy severity ([place])  Hyperlipidemia, unspecified hyperlipidemia type  Essential hypertension  Insomnia, unspecified type  Primary osteoarthritis of both knees  Diabetic retinopathy of both eyes  associated with type 2 diabetes mellitus, macular edema presence unspecified, unspecified retinopathy severity ([place])  Meds ordered this encounter  Medications  . zolpidem (AMBIEN) 5 MG tablet    Sig: Take 1 tablet (5 mg total) by mouth at bedtime as needed. for sleep    Dispense:  31 tablet    Refill:  5    This request is for a new prescription for a controlled substance as required by Federal/State law..   Return precautions advised.  Garret Reddish, MD

## 2019-03-13 NOTE — Telephone Encounter (Signed)
Pt scheduled for Telephone visit today.

## 2019-03-13 NOTE — Patient Instructions (Addendum)
Health Maintenance Due  Topic Date Due  . URINE MICROALBUMIN - next visit 02/18/2019  . HEMOGLOBIN A1C - next visit 02/23/2019   Video visit

## 2019-03-14 ENCOUNTER — Telehealth: Payer: Self-pay | Admitting: Family Medicine

## 2019-03-14 NOTE — Telephone Encounter (Signed)
PA initiated via [email or web address]  Elton Sin (Key: [id number])   Your information has been sent to [facility] Part D.

## 2019-03-14 NOTE — Telephone Encounter (Signed)
See note  Copied from [place] [id number]. Topic: General - Other >> Mar 14, 2019  8:50 AM Parke Poisson wrote: Reason for CRM: Pt states that her insurance company needs office to contact them for prior auth of her zolpidem (AMBIEN) 5 MG tablet. She is completely out and would like this done today.Phone number is [phone] Pharmacy is [id number] - [place], [place]. AT [address] [place] [phone] (Phone) [phone] (Fax)

## 2019-03-16 NOTE — Telephone Encounter (Signed)
PA for Ambien approved.  Patient's pharmacy notified.

## 2019-03-18 ENCOUNTER — Other Ambulatory Visit: Payer: Self-pay | Admitting: Family Medicine

## 2019-03-22 ENCOUNTER — Other Ambulatory Visit: Payer: Self-pay | Admitting: Family Medicine

## 2019-04-13 ENCOUNTER — Telehealth: Payer: Self-pay | Admitting: Family Medicine

## 2019-04-13 NOTE — Telephone Encounter (Signed)
Medication Refill - Medication: Lancets (ONETOUCH ULTRASOFT) lancets,glucose blood test strip    Has the patient contacted their pharmacy? Yes (Agent: If no, request that the patient contact the pharmacy for the refill.) (Agent: If yes, when and what did the pharmacy advise?)Contact PCP  Preferred Pharmacy (with phone number or street name):  [place], [place] [phone] (Phone) [phone] (Fax)     Agent: Please be advised that RX refills may take up to 3 business days. We ask that you follow-up with your pharmacy.

## 2019-04-16 ENCOUNTER — Telehealth: Payer: Self-pay | Admitting: Family Medicine

## 2019-04-16 ENCOUNTER — Other Ambulatory Visit: Payer: Self-pay

## 2019-04-16 DIAGNOSIS — R69 Illness, unspecified: Secondary | ICD-10-CM | POA: Diagnosis not present

## 2019-04-16 MED ORDER — GLUCOSE BLOOD VI STRP: ORAL_STRIP | 12 refills | Status: DC

## 2019-04-16 MED ORDER — ONETOUCH ULTRASOFT LANCETS MISC: 12 refills | Status: DC

## 2019-04-16 NOTE — Telephone Encounter (Signed)
Medication Refill - Medication: glucose blood test strip  Pt has no test strips left and needs sent asap   Has the patient contacted their pharmacy? Yes.   (Agent: If no, request that the patient contact the pharmacy for the refill.) (Agent: If yes, when and what did the pharmacy advise?) Request sent to office   Preferred Pharmacy (with phone number or street name):  [place], [place] [phone] (Phone) [phone] (Fax)     Agent: Please be advised that RX refills may take up to 3 business days. We ask that you follow-up with your pharmacy.

## 2019-04-16 NOTE — Telephone Encounter (Signed)
See note

## 2019-04-16 NOTE — Telephone Encounter (Signed)
Rx sent to pharmacy   

## 2019-05-18 ENCOUNTER — Other Ambulatory Visit: Payer: Self-pay

## 2019-05-18 ENCOUNTER — Encounter: Payer: Self-pay | Admitting: Family Medicine

## 2019-05-18 ENCOUNTER — Encounter

## 2019-05-18 ENCOUNTER — Ambulatory Visit (INDEPENDENT_AMBULATORY_CARE_PROVIDER_SITE_OTHER): Payer: Medicare HMO | Admitting: Family Medicine

## 2019-05-18 VITALS — BP 136/80 | HR 100 | Temp 98.0°F | Ht 60.75 in | Wt 135.0 lb

## 2019-05-18 DIAGNOSIS — Z0000 Encounter for general adult medical examination without abnormal findings: Secondary | ICD-10-CM

## 2019-05-18 DIAGNOSIS — G4700 Insomnia, unspecified: Secondary | ICD-10-CM | POA: Diagnosis not present

## 2019-05-18 DIAGNOSIS — E785 Hyperlipidemia, unspecified: Secondary | ICD-10-CM

## 2019-05-18 DIAGNOSIS — E11319 Type 2 diabetes mellitus with unspecified diabetic retinopathy without macular edema: Secondary | ICD-10-CM

## 2019-05-18 DIAGNOSIS — L602 Onychogryphosis: Secondary | ICD-10-CM | POA: Diagnosis not present

## 2019-05-18 DIAGNOSIS — E1169 Type 2 diabetes mellitus with other specified complication: Secondary | ICD-10-CM | POA: Diagnosis not present

## 2019-05-18 DIAGNOSIS — L84 Corns and callosities: Secondary | ICD-10-CM | POA: Diagnosis not present

## 2019-05-18 DIAGNOSIS — E1351 Other specified diabetes mellitus with diabetic peripheral angiopathy without gangrene: Secondary | ICD-10-CM | POA: Diagnosis not present

## 2019-05-18 LAB — COMPREHENSIVE METABOLIC PANEL
ALT: 10 U/L (ref 0–35)
AST: 14 U/L (ref 0–37)
Albumin: 4.6 g/dL (ref 3.5–5.2)
Alkaline Phosphatase: 72 U/L (ref 39–117)
BUN: 22 mg/dL (ref 6–23)
CO2: 28 mEq/L (ref 19–32)
Calcium: 10.2 mg/dL (ref 8.4–10.5)
Chloride: 103 mEq/L (ref 96–112)
Creatinine, Ser: 0.97 mg/dL (ref 0.40–1.20)
GFR: 54.44 mL/min — ABNORMAL LOW (ref >60.00)
Glucose, Bld: 133 mg/dL — ABNORMAL HIGH (ref 70–99)
Potassium: 5.4 mEq/L — ABNORMAL HIGH (ref 3.5–5.1)
Sodium: 140 mEq/L (ref 135–145)
Total Bilirubin: 0.7 mg/dL (ref 0.2–1.2)
Total Protein: 7.1 g/dL (ref 6.0–8.3)

## 2019-05-18 LAB — CBC
HCT: 36.8 % (ref 36.0–46.0)
Hemoglobin: 11.9 g/dL — ABNORMAL LOW (ref 12.0–15.0)
MCHC: 32.2 g/dL (ref 30.0–36.0)
MCV: 93.6 fl (ref 78.0–100.0)
Platelets: 301 10*3/uL (ref 150.0–400.0)
RBC: 3.93 Mil/uL (ref 3.87–5.11)
RDW: 14 % (ref 11.5–15.5)
WBC: 8.1 10*3/uL (ref 4.0–10.5)

## 2019-05-18 LAB — LIPID PANEL
Cholesterol: 146 mg/dL (ref 0–200)
HDL: 73.4 mg/dL (ref >39.00)
LDL Cholesterol: 57 mg/dL (ref 0–99)
NonHDL: 72.86
Total CHOL/HDL Ratio: 2
Triglycerides: 80 mg/dL (ref 0.0–149.0)
VLDL: 16 mg/dL (ref 0.0–40.0)

## 2019-05-18 LAB — MICROALBUMIN / CREATININE URINE RATIO
Creatinine,U: 92.8 mg/dL
Microalb Creat Ratio: 4.7 mg/g (ref 0.0–30.0)
Microalb, Ur: 4.3 mg/dL — ABNORMAL HIGH (ref 0.0–1.9)

## 2019-05-18 LAB — HEMOGLOBIN A1C: Hgb A1c MFr Bld: 6.7 % — ABNORMAL HIGH (ref 4.6–6.5)

## 2019-05-18 NOTE — Progress Notes (Signed)
Phone: [phone]   Subjective:  Patient presents today for their annual physical. Chief complaint-noted.   See problem oriented charting- ROS- full  review of systems was completed and negative except for:  Some joint pain in knees, some leg pain as well at times. Denies dizziness, chest pain, palpitations   The following were reviewed and entered/updated in epic: Past Medical History:  Diagnosis Date  . Depression   . Diabetes mellitus   . Hyperlipidemia   . Hypertension   . Osteoporosis    Patient Active Problem List   Diagnosis Date Noted  . Diabetic retinopathy ([place]) 07/11/2015    Priority: High  . Diabetes Type II with retinopathy 05/15/2007    Priority: High  . PAC (premature atrial contraction) 07/11/2015    Priority: Medium  . CKD (chronic kidney disease), stage II 07/05/2014    Priority: Medium  . Insomnia 06/14/2008    Priority: Medium  . Hyperlipidemia associated with type 2 diabetes mellitus ([place]) 05/15/2007    Priority: Medium  . Hypertension associated with diabetes ([place]) 05/15/2007    Priority: Medium  . Osteoporosis 12/12/2009    Priority: Low  . INTENTION TREMOR 10/11/2008    Priority: Low  . DEPRESSION 06/14/2008    Priority: Low  . ANEMIA, OTHER UNSPEC 01/03/2008    Priority: Low  . Osteoarthritis of both knees 02/17/2018   Past Surgical History:  Procedure Laterality Date  . CARPAL TUNNEL RELEASE     left  . CATARACT EXTRACTION    . CESAREAN SECTION      Family History  Problem Relation Age of Onset  . Cancer Mother        colon  . Heart attack Father   . Heart attack Brother   . Cancer Brother        stomach  . Other Sister        died at 86    Medications- reviewed and updated Current Outpatient Medications  Medication Sig Dispense Refill  . amLODipine (NORVASC) 5 MG tablet TAKE 1 TABLET BY MOUTH EVERY DAY 90 tablet 3  . blood glucose meter kit and supplies KIT Dispense based on patient and insurance preference. Use to  check blood sugar daily. One Touch glucometer E11.9 1 each 0  . diclofenac sodium (VOLTAREN) 1 % GEL Apply 2 g topically 4 (four) times daily. 100 g 5  . glucose blood test strip Use to test blood sugar daily and PRN 100 each 12  . Lancets (ONETOUCH ULTRASOFT) lancets Use to test blood sugar daily and PRN 100 each 12  . metFORMIN (GLUCOPHAGE) 850 MG tablet TAKE 1 TABLET BY MOUTH TWICE A DAY 60 tablet 5  . simvastatin (ZOCOR) 40 MG tablet TAKE 1 TABLET BY MOUTH EVERYDAY AT BEDTIME 90 tablet 1  . zolpidem (AMBIEN) 5 MG tablet Take 1 tablet (5 mg total) by mouth at bedtime as needed. for sleep 31 tablet 5   No current facility-administered medications for this visit.     Allergies-reviewed and updated Allergies  Allergen Reactions  . Alendronate Sodium     REACTION: upset stomach, joint and muscle pain  . Codeine Sulfate     REACTION: dizziness, "put her to bed"  . Ibandronate Sodium     REACTION: upset stomach, joint and muscle pain    Social History   Social History Narrative   Grew up in [place], moved to [place] around 2005. Widowed 10 years ago of "emboli". Only son is a Company secretary at [employer]  so moved close for that reason.       Lives in home with son but has separate portion of home. Eats dinner together at time.       Hobbies: shopping, movies, [place] visiting 2 sisters   Objective  Objective:  BP 136/80   Pulse 100   Temp 98 F (36.7 C) (Oral)   Ht 5' 0.75" (1.543 m)   Wt 135 lb (61.2 kg)   SpO2 97%   BMI 25.72 kg/m  Gen: NAD, resting comfortably, appears younger than stated age 86: Mucous membranes are moist. Oropharynx normal Neck: no thyromegaly or cervical lymphadenopathy CV: RRR no murmurs rubs or gallops Lungs: CTAB no crackles, wheeze, rhonchi Abdomen: soft/nontender/nondistended/normal bowel sounds. No rebound or guarding.  Ext: no edema and 2+ PT pulses Skin: warm, dry Neuro: grossly normal, moves all extremities, PERRLA     Assessment and Plan    85 y.o. female presenting for annual physical.  Health Maintenance counseling: 1. Anticipatory guidance: Patient counseled regarding regular dental exams -q6 months, eye exams - regular and sees Dr. Zigmund Daniel in November,  avoiding smoking and second hand smoke , limiting alcohol to 1 beverage per day- doesn't drink .   2. Risk factor reduction:  Advised patient of need for regular exercise and diet rich and fruits and vegetables to reduce risk of heart attack and stroke. Exercise-  Continues to try to walk when weather allows. Diet- eats a reasonably healthy diet.  BMI technically mildly high but would not suggest weight loss at her age-education provided.  Instead focus on healthy eating and regular exercise-if happens to lose weight that is fine but should not be the focus. Wt Readings from Last 3 Encounters:  05/18/19 135 lb (61.2 kg)  08/25/18 136 lb (61.7 kg)  07/28/18 135 lb 6.4 oz (61.4 kg)  3. Immunizations/screenings/ancillary studies-discussed Shingrix-we defer for now due to covid-19. We discussed can get at pharmacy if she would like  Immunization History  Administered Date(s) Administered  . H1N1 10/11/2008  . Influenza Split 08/06/2011, 08/13/2011, 08/11/2012  . Influenza Whole 08/25/2007, 08/09/2008, 08/01/2009, 07/24/2010  . Influenza, High Dose Seasonal PF 07/16/2016, 07/15/2017, 07/28/2018  . Influenza,inj,Quad PF,6+ Mos 06/29/2013, 07/05/2014, 07/11/2015  . Pneumococcal Conjugate-13 01/03/2015  . Pneumococcal Polysaccharide-23 11/10/2007  . Td 04/17/2010  . Zoster 04/17/2010  4. Cervical cancer screening- past age based screening 5. Breast cancer screening-no breast cancer in family.  Past age based screening.  Last mammogram 2011. 6. Colon cancer screening -passed age to based screening.  No blood in the stool.  2009 was last examination-no further colonoscopy due to age. 7. Skin cancer screening-sees Dr. Renda Rolls.Marland Kitchen advised regular sunscreen  use. Denies worrisome, changing, or new skin lesions.  8. Birth control/STD check-not sexually active and postmenopausal 9. Osteoporosis screening at 73- osteoporosis noted in the past-patient has declined repeat bone density testing.  She wants to continue calcium and vitamin D only.  Has been on injections in the past. 10.  Never smoker  Status of chronic or acute concerns   Osteoporosis- see above  Doing a good job with social distancing- making the most of it  Diabetes with retinopathy - Taking Metformin 850 mg BID. Update a1c and urine microalbumin.  Sees Dr. Zigmund Daniel for retinopathy each Nov/December-has been stable.  Saw podiatry this AM- close check on feet and trim nails  Hypertension - Taking Amlodipine 5 mg daily.  Home bp 120/72  Baseline HR tends to run high. No palpitations, chest pain, SOB. Monitor  if levels increase.   Hyperlipidemia - Taking Simvastatin 40 mg daily.  Update lipids today.  Simvastatin dose slightly high considering she is on amlodipine but she has done well so we will continue current medications.  Depression history-years ago after husband passed-she has been doing well lately for the most part   Insomnia - Taking Zolpidem 5 mg qhs prn. Does not sleep at all without medication is able to sleep 5 hours with it.  No falls- no oversedation-no driving issues.  Continue current medication - did not do well with XR so back on ambien 5 mg  Osteoarthritis of both knees- Using Voltaren 1% gel prn.   Has had some anemia in past- we stopped asa last year. Check cbc   Recommended follow up: 6 months for regular follow up if a1c is ok  Future Appointments  Date Time Provider [place]  08/03/2019  9:40 AM Marin Olp, MD [facility]  09/14/2019  9:15 AM Hayden Pedro, MD [facility] None  11/16/2019  9:40 AM Yong Channel Brayton Mars, MD [facility]   Lab/Order associations: Fasting   ICD-10-CM   1. Preventative health care  Z00.00 Hemoglobin A1c     Urine Microalbumin w/creat. ratio    CBC    Comprehensive metabolic panel    Lipid panel  2. Type 2 diabetes mellitus with retinopathy, without long-term current use of insulin, macular edema presence unspecified, unspecified laterality, unspecified retinopathy severity ([facility])  E11.319 Hemoglobin A1c    Urine Microalbumin w/creat. ratio    CBC    Comprehensive metabolic panel    Lipid panel  3. Hyperlipidemia associated with type 2 diabetes mellitus ([place])  E11.69    E78.5   4. Diabetic retinopathy of both eyes associated with type 2 diabetes mellitus, macular edema presence unspecified, unspecified retinopathy severity ([place])  E11.319   5. Insomnia, unspecified type  G47.00    Return precautions advised.  Garret Reddish, MD

## 2019-05-18 NOTE — Patient Instructions (Addendum)
Please stop by lab before you go If you do not have mychart- we will call you about results within 5 business days of [place] receiving them.  If you have mychart- we will send your results within 3 business days of [place] receiving them.  If abnormal or we want to clarify a result, we will call or mychart you to make sure you receive the message.  If you have questions or concerns or don't hear within 5-7 days, please send [place] a message or call us.   No changes today and 6 month follow up with me as long as a1c still looks good under 7

## 2019-05-21 ENCOUNTER — Other Ambulatory Visit: Payer: Self-pay

## 2019-05-21 DIAGNOSIS — E875 Hyperkalemia: Secondary | ICD-10-CM

## 2019-05-25 ENCOUNTER — Other Ambulatory Visit (INDEPENDENT_AMBULATORY_CARE_PROVIDER_SITE_OTHER): Payer: Medicare HMO

## 2019-05-25 ENCOUNTER — Other Ambulatory Visit: Payer: Self-pay

## 2019-05-25 DIAGNOSIS — E875 Hyperkalemia: Secondary | ICD-10-CM

## 2019-05-25 LAB — BASIC METABOLIC PANEL
BUN: 20 mg/dL (ref 6–23)
CO2: 28 mEq/L (ref 19–32)
Calcium: 9.9 mg/dL (ref 8.4–10.5)
Chloride: 104 mEq/L (ref 96–112)
Creatinine, Ser: 0.98 mg/dL (ref 0.40–1.20)
GFR: 53.79 mL/min — ABNORMAL LOW (ref >60.00)
Glucose, Bld: 121 mg/dL — ABNORMAL HIGH (ref 70–99)
Potassium: 5.1 mEq/L (ref 3.5–5.1)
Sodium: 140 mEq/L (ref 135–145)

## 2019-06-01 ENCOUNTER — Other Ambulatory Visit: Payer: Medicare HMO

## 2019-06-12 ENCOUNTER — Other Ambulatory Visit: Payer: Self-pay | Admitting: Family Medicine

## 2019-06-28 DIAGNOSIS — R69 Illness, unspecified: Secondary | ICD-10-CM | POA: Diagnosis not present

## 2019-07-06 ENCOUNTER — Encounter: Payer: Medicare HMO | Admitting: Family Medicine

## 2019-07-12 ENCOUNTER — Other Ambulatory Visit: Payer: Self-pay | Admitting: Family Medicine

## 2019-07-17 DIAGNOSIS — R69 Illness, unspecified: Secondary | ICD-10-CM | POA: Diagnosis not present

## 2019-07-19 ENCOUNTER — Telehealth: Payer: Self-pay | Admitting: Family Medicine

## 2019-07-19 NOTE — Telephone Encounter (Signed)
I left a message asking the patient to call me at [phone] to reschedule 08/03/2019 AWV. VDM (Dee-Dee)

## 2019-08-03 ENCOUNTER — Ambulatory Visit: Payer: Medicare HMO

## 2019-08-03 ENCOUNTER — Ambulatory Visit: Payer: Medicare HMO | Admitting: Family Medicine

## 2019-08-17 ENCOUNTER — Other Ambulatory Visit: Payer: Self-pay

## 2019-08-17 ENCOUNTER — Ambulatory Visit (INDEPENDENT_AMBULATORY_CARE_PROVIDER_SITE_OTHER): Payer: Medicare HMO

## 2019-08-17 VITALS — BP 128/64 | Temp 98.1°F | Ht 61.0 in | Wt 136.2 lb

## 2019-08-17 DIAGNOSIS — Z0000 Encounter for general adult medical examination without abnormal findings: Secondary | ICD-10-CM

## 2019-08-17 DIAGNOSIS — L602 Onychogryphosis: Secondary | ICD-10-CM | POA: Diagnosis not present

## 2019-08-17 DIAGNOSIS — E1351 Other specified diabetes mellitus with diabetic peripheral angiopathy without gangrene: Secondary | ICD-10-CM | POA: Diagnosis not present

## 2019-08-17 DIAGNOSIS — Z23 Encounter for immunization: Secondary | ICD-10-CM

## 2019-08-17 DIAGNOSIS — L84 Corns and callosities: Secondary | ICD-10-CM | POA: Diagnosis not present

## 2019-08-17 NOTE — Progress Notes (Signed)
Subjective:   Laura Salas is a 85 y.o. female who presents for Medicare Annual (Subsequent) preventive examination.  Review of Systems:   Cardiac Risk Factors include: advanced age (>45mn, >>52women);hypertension;dyslipidemia;diabetes mellitus     Objective:     Vitals: BP 128/64 (BP Location: Left Arm, Patient Position: Sitting, Cuff Size: Normal)   Temp 98.1 F (36.7 C) (Temporal)   Ht '5\' 1"'$  (1.549 m)   Wt 136 lb 3.2 oz (61.8 kg)   BMI 25.73 kg/m   Body mass index is 25.73 kg/m.  Advanced Directives 08/17/2019 07/28/2018 07/15/2017 06/24/2013  Does Patient Have a Medical Advance Directive? Yes Yes Yes Patient does not have advance directive  Type of [place]ive [place] Living [place];Healthcare Power of Attorney - -  Does patient want to make changes to medical advance directive? No - Patient declined No - Patient declined - -  Copy of [place]? No - copy requested No - copy requested - -    Tobacco Social History   Tobacco Use  Smoking Status Never Smoker  Smokeless Tobacco Never Used     Counseling given: Not Answered   Clinical Intake:  Pre-visit preparation completed: Yes  Diabetes: Yes CBG done?: No Did pt. bring in CBG monitor from home?: No(reports fasting blood sugars 109)  How often do you need to have someone help you when you read instructions, pamphlets, or other written materials from your doctor or pharmacy?: 1 - Never  Interpreter Needed?: No  Information entered by :: CDenman GeorgeLPN  Past Medical History:  Diagnosis Date  . Depression   . Diabetes mellitus   . Hyperlipidemia   . Hypertension   . Osteoporosis    Past Surgical History:  Procedure Laterality Date  . CARPAL TUNNEL RELEASE     left  . CATARACT EXTRACTION    . CESAREAN SECTION     Family History  Problem Relation Age of Onset  . Cancer Mother        colon  . Heart attack Father   . Heart  attack Brother   . Cancer Brother        stomach  . Other Sister        died at 912  Social History   Socioeconomic History  . Marital status: Widowed    Spouse name: Not on file  . Number of children: Not on file  . Years of education: Not on file  . 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 Financial resource strain: Not on file  . Food insecurity    Worry: Not on file    Inability: Not on file  . Transportation needs    Medical: Not on file    Non-medical: Not on file  Tobacco Use  . Smoking status: Never Smoker  . Smokeless tobacco: Never Used  Substance and Sexual Activity  . Alcohol use: No  . Drug use: No  . Sexual activity: Not on file  Lifestyle  . Physical activity    Days per week: Not on file    Minutes per session: Not on file  . Stress: Not on file  Relationships  . Social [place] phone: Not on file    Gets together: Not on file    Attends religious service: Not on file    Active  member of club or organization: Not on file    Attends meetings of clubs or organizations: Not on file    Relationship status: Not on file  Other Topics Concern  . Not on file  Social History Narrative   Grew up in [place], moved to [place] around 2005. Widowed 10 years ago of "emboli". Only son is a Company secretary at [employer] so moved close for that reason.  2 grown grandchildren (grandson is in [place]; granddaughter in grad school for physical therapy)       Lives in home with son but has separate portion of home. Eats dinner together at time.       Hobbies: shopping, movies, [place] visiting 2 sisters    Outpatient Encounter Medications as of 08/17/2019  Medication Sig  . amLODipine (NORVASC) 5 MG tablet TAKE 1 TABLET BY MOUTH EVERY DAY  . blood glucose meter kit and supplies KIT Dispense based on patient and insurance preference. Use to  check blood sugar daily. One Touch glucometer E11.9  . diclofenac sodium (VOLTAREN) 1 % GEL Apply 2 g topically 4 (four) times daily.  Marland Kitchen glucose blood test strip Use to test blood sugar daily and PRN  . Lancets (ONETOUCH ULTRASOFT) lancets Use to test blood sugar daily and PRN  . metFORMIN (GLUCOPHAGE) 850 MG tablet TAKE 1 TABLET BY MOUTH TWICE A DAY  . simvastatin (ZOCOR) 40 MG tablet TAKE 1 TABLET BY MOUTH EVERYDAY AT BEDTIME  . zolpidem (AMBIEN) 5 MG tablet Take 1 tablet (5 mg total) by mouth at bedtime as needed. for sleep   No facility-administered encounter medications on file as of 08/17/2019.     Activities of Daily Living In your present state of health, do you have any difficulty performing the following activities: 08/17/2019  Hearing? N  Vision? N  Difficulty concentrating or making decisions? N  Walking or climbing stairs? N  Dressing or bathing? N  Doing errands, shopping? N  Preparing Food and eating ? N  Using the Toilet? N  In the past six months, have you accidently leaked urine? N  Do you have problems with loss of bowel control? N  Managing your Medications? N  Managing your Finances? N  Housekeeping or managing your Housekeeping? N  Some recent data might be hidden    Patient Care Team: Marin Olp, MD as PCP - General (Family Medicine) Hayden Pedro, MD as Consulting Physician (Ophthalmology) Netta Cedars, MD as Consulting Physician (Orthopedic Surgery) [place], Jennefer Bravo, MD as Referring Physician (Dermatology) Rosemary Holms, DPM as Consulting Physician (Podiatry)    Assessment:   This is a routine wellness examination for Zona.  Exercise Activities and Dietary recommendations Current Exercise Habits: Home exercise routine, Type of exercise: walking, Time (Minutes): 30, Frequency (Times/Week): 3, Weekly Exercise (Minutes/Week): 90  Goals    . patient     Exercise as much as she can!        Fall Risk Fall Risk  08/17/2019  07/28/2018 07/15/2017 01/14/2017 01/09/2016  Falls in the past year? 0 No No No No  Comment - - 4 to5 years ago; fell due to dehydration - -  Number falls in past yr: - - - - -  Injury with Fall? 0 - - - -  Risk for fall due to : - - Impaired mobility - -  Risk for fall due to: Comment - - at risk due to osteoporosis  - -  Follow up Falls evaluation completed;Education provided;Falls prevention  discussed - - - -   Is the patient's home free of loose throw rugs in walkways, pet beds, electrical cords, etc?   yes      Grab bars in the bathroom? yes      Handrails on the stairs?   yes      Adequate lighting?   yes  Timed Get Up and Go performed: completed and within normal timeframe; no gait abnormalities noted   Depression Screen PHQ 2/9 Scores 08/17/2019 07/28/2018 07/15/2017 01/14/2017  PHQ - 2 Score 0 0 0 0  PHQ- 9 Score - 0 - -     Cognitive Function MMSE - Mini Mental State Exam 07/15/2017  Not completed: (No Data)     6CIT Screen 08/17/2019  What Year? 0 points  What month? 0 points  What time? 0 points  Count back from 20 0 points  Months in reverse 0 points  Repeat phrase 0 points  Total Score 0    Immunization History  Administered Date(s) Administered  . Fluad Quad(high Dose 65+) 08/17/2019  . H1N1 10/11/2008  . Influenza Split 08/06/2011, 08/13/2011, 08/11/2012  . Influenza Whole 08/25/2007, 08/09/2008, 08/01/2009, 07/24/2010  . Influenza, High Dose Seasonal PF 07/16/2016, 07/15/2017, 07/28/2018  . Influenza,inj,Quad PF,6+ Mos 06/29/2013, 07/05/2014, 07/11/2015  . Pneumococcal Conjugate-13 01/03/2015  . Pneumococcal Polysaccharide-23 11/10/2007  . Td 04/17/2010  . Zoster 04/17/2010    Qualifies for Shingles Vaccine?Discussed and patient will check with pharmacy for coverage.  Patient education handout provided   Screening Tests Health Maintenance  Topic Date Due  . INFLUENZA VACCINE  05/26/2019  . FOOT EXAM  07/29/2019  . OPHTHALMOLOGY EXAM  09/16/2019  .  HEMOGLOBIN A1C  11/18/2019  . TETANUS/TDAP  04/17/2020  . URINE MICROALBUMIN  05/17/2020  . DEXA SCAN  Completed  . PNA vac Low Risk Adult  Completed    Cancer Screenings: Lung: Low Dose CT Chest recommended if Age 30-80 years, 30 pack-year currently smoking OR have quit w/in 15years. Patient does not qualify. Breast:  Up to date on Mammogram? Yes   Up to date of Bone Density/Dexa? Yes Colorectal: No longer indicated     Plan:  I have personally reviewed and addressed the Medicare Annual Wellness questionnaire and have noted the following in the patient's chart:  A. Medical and social history B. Use of alcohol, tobacco or illicit drugs  C. Current medications and supplements D. Functional ability and status E.  Nutritional status F.  Physical activity G. Advance directives H. List of other physicians I.  Hospitalizations, surgeries, and ER visits in previous 12 months J.  [place] such as hearing and vision if needed, cognitive and depression L. Referrals, records requested, and appointments- none   In addition, I have reviewed and discussed with patient certain preventive protocols, quality metrics, and best practice recommendations. A written personalized care plan for preventive services as well as general preventive health recommendations were provided to patient.   Signed,  Denman George, LPN  Nurse Health Advisor   Nurse Notes: no additional

## 2019-08-17 NOTE — Patient Instructions (Signed)
Laura Salas , Thank you for taking time to come for your Medicare Wellness Visit. I appreciate your ongoing commitment to your health goals. Please review the following plan we discussed and let me know if I can assist you in the future.   Screening recommendations/referrals: Colorectal Screening: No longer indicated Mammogram: No longer indicated Bone Density: No longer indicated   Vision and Dental Exams: Recommended annual ophthalmology exams for early detection of glaucoma and other disorders of the eye Recommended annual dental exams for proper oral hygiene  Diabetic Exams: Diabetic Eye Exam: recommended yearly Diabetic Foot Exam: with podiatrist  Vaccinations: Influenza vaccine: today Pneumococcal vaccine: up to date; last 01/03/15 Tdap vaccine: up to date; last 04/17/10 Shingles vaccine: Please call your insurance company to determine your out of pocket expense for the Shingrix vaccine. You may receive this vaccine at your local pharmacy.  Advanced directives: Please bring a copy of your POA (Power of Attorney) and/or Living Will to your next appointment.  Goals: Recommend to drink at least 6-8 8oz glasses of water per day and consume a balanced diet rich in fresh fruits and vegetables.   Next appointment: Please schedule your Annual Wellness Visit with your Nurse Health Advisor in one year.  Preventive Care 45 Years and Older, Female Preventive care refers to lifestyle choices and visits with your health care provider that can promote health and wellness. What does preventive care include?  A yearly physical exam. This is also called an annual well check.  Dental exams once or twice a year.  Routine eye exams. Ask your health care provider how often you should have your eyes checked.  Personal lifestyle choices, including:  Daily care of your teeth and gums.  Regular physical activity.  Eating a healthy diet.  Avoiding tobacco and drug use.  Limiting alcohol use.   Practicing safe sex.  Taking low-dose aspirin every day if recommended by your health care provider.  Taking vitamin and mineral supplements as recommended by your health care provider. What happens during an annual well check? The services and screenings done by your health care provider during your annual well check will depend on your age, overall health, lifestyle risk factors, and family history of disease. Counseling  Your health care provider may ask you questions about your:  Alcohol use.  Tobacco use.  Drug use.  Emotional well-being.  Home and relationship well-being.  Sexual activity.  Eating habits.  History of falls.  Memory and ability to understand (cognition).  Work and work Statistician.  Reproductive health. Screening  You may have the following tests or measurements:  Height, weight, and BMI.  Blood pressure.  Lipid and cholesterol levels. These may be checked every 5 years, or more frequently if you are over 94 years old.  Skin check.  Lung cancer screening. You may have this screening every year starting at age 46 if you have a 30-pack-year history of smoking and currently smoke or have quit within the past 15 years.  Fecal occult blood test (FOBT) of the stool. You may have this test every year starting at age 65.  Flexible sigmoidoscopy or colonoscopy. You may have a sigmoidoscopy every 5 years or a colonoscopy every 10 years starting at age 62.  Hepatitis C blood test.  Hepatitis B blood test.  Sexually transmitted disease (STD) testing.  Diabetes screening. This is done by checking your blood sugar (glucose) after you have not eaten for a while (fasting). You may have this done every 1-3  years.  Bone density scan. This is done to screen for osteoporosis. You may have this done starting at age 74.  Mammogram. This may be done every 1-2 years. Talk to your health care provider about how often you should have regular mammograms. Talk  with your health care provider about your test results, treatment options, and if necessary, the need for more tests. Vaccines  Your health care provider may recommend certain vaccines, such as:  Influenza vaccine. This is recommended every year.  Tetanus, diphtheria, and acellular pertussis (Tdap, Td) vaccine. You may need a Td booster every 10 years.  Zoster vaccine. You may need this after age 58.  Pneumococcal 13-valent conjugate (PCV13) vaccine. One dose is recommended after age 38.  Pneumococcal polysaccharide (PPSV23) vaccine. One dose is recommended after age 21. Talk to your health care provider about which screenings and vaccines you need and how often you need them. This information is not intended to replace advice given to you by your health care provider. Make sure you discuss any questions you have with your health care provider. Document Released: 11/07/2015 Document Revised: 06/30/2016 Document Reviewed: 08/12/2015 Elsevier Interactive Patient Education  2017 [place] Prevention in the Home Falls can cause injuries. They can happen to people of all ages. There are many things you can do to make your home safe and to help prevent falls. What can I do on the outside of my home?  Regularly fix the edges of walkways and driveways and fix any cracks.  Remove anything that might make you trip as you walk through a door, such as a raised step or threshold.  Trim any bushes or trees on the path to your home.  Use bright outdoor lighting.  Clear any walking paths of anything that might make someone trip, such as rocks or tools.  Regularly check to see if handrails are loose or broken. Make sure that both sides of any steps have handrails.  Any raised decks and porches should have guardrails on the edges.  Have any leaves, snow, or ice cleared regularly.  Use sand or salt on walking paths during winter.  Clean up any spills in your garage right away. This  includes oil or grease spills. What can I do in the bathroom?  Use night lights.  Install grab bars by the toilet and in the tub and shower. Do not use towel bars as grab bars.  Use non-skid mats or decals in the tub or shower.  If you need to sit down in the shower, use a plastic, non-slip stool.  Keep the floor dry. Clean up any water that spills on the floor as soon as it happens.  Remove soap buildup in the tub or shower regularly.  Attach bath mats securely with double-sided non-slip rug tape.  Do not have throw rugs and other things on the floor that can make you trip. What can I do in the bedroom?  Use night lights.  Make sure that you have a light by your bed that is easy to reach.  Do not use any sheets or blankets that are too big for your bed. They should not hang down onto the floor.  Have a firm chair that has side arms. You can use this for support while you get dressed.  Do not have throw rugs and other things on the floor that can make you trip. What can I do in the kitchen?  Clean up any spills right away.  Avoid walking on wet floors.  Keep items that you use a lot in easy-to-reach places.  If you need to reach something above you, use a strong step stool that has a grab bar.  Keep electrical cords out of the way.  Do not use floor polish or wax that makes floors slippery. If you must use wax, use non-skid floor wax.  Do not have throw rugs and other things on the floor that can make you trip. What can I do with my stairs?  Do not leave any items on the stairs.  Make sure that there are handrails on both sides of the stairs and use them. Fix handrails that are broken or loose. Make sure that handrails are as long as the stairways.  Check any carpeting to make sure that it is firmly attached to the stairs. Fix any carpet that is loose or worn.  Avoid having throw rugs at the top or bottom of the stairs. If you do have throw rugs, attach them to the  floor with carpet tape.  Make sure that you have a light switch at the top of the stairs and the bottom of the stairs. If you do not have them, ask someone to add them for you. What else can I do to help prevent falls?  Wear shoes that:  Do not have high heels.  Have rubber bottoms.  Are comfortable and fit you well.  Are closed at the toe. Do not wear sandals.  If you use a stepladder:  Make sure that it is fully opened. Do not climb a closed stepladder.  Make sure that both sides of the stepladder are locked into place.  Ask someone to hold it for you, if possible.  Clearly mark and make sure that you can see:  Any grab bars or handrails.  First and last steps.  Where the edge of each step is.  Use tools that help you move around (mobility aids) if they are needed. These include:  Canes.  Walkers.  Scooters.  Crutches.  Turn on the lights when you go into a dark area. Replace any light bulbs as soon as they burn out.  Set up your furniture so you have a clear path. Avoid moving your furniture around.  If any of your floors are uneven, fix them.  If there are any pets around you, be aware of where they are.  Review your medicines with your doctor. Some medicines can make you feel dizzy. This can increase your chance of falling. Ask your doctor what other things that you can do to help prevent falls. This information is not intended to replace advice given to you by your health care provider. Make sure you discuss any questions you have with your health care provider. Document Released: 08/07/2009 Document Revised: 03/18/2016 Document Reviewed: 11/15/2014 Elsevier Interactive Patient Education  2017 [employer].

## 2019-08-17 NOTE — Progress Notes (Signed)
I have reviewed and agree with note, evaluation, plan.   Tayen Narang, MD  

## 2019-09-12 ENCOUNTER — Other Ambulatory Visit: Payer: Self-pay | Admitting: Family Medicine

## 2019-09-14 ENCOUNTER — Encounter (INDEPENDENT_AMBULATORY_CARE_PROVIDER_SITE_OTHER): Payer: Medicare HMO | Admitting: Ophthalmology

## 2019-09-14 DIAGNOSIS — I10 Essential (primary) hypertension: Secondary | ICD-10-CM | POA: Diagnosis not present

## 2019-09-14 DIAGNOSIS — E11319 Type 2 diabetes mellitus with unspecified diabetic retinopathy without macular edema: Secondary | ICD-10-CM | POA: Diagnosis not present

## 2019-09-14 DIAGNOSIS — H43813 Vitreous degeneration, bilateral: Secondary | ICD-10-CM

## 2019-09-14 DIAGNOSIS — H35033 Hypertensive retinopathy, bilateral: Secondary | ICD-10-CM

## 2019-09-14 DIAGNOSIS — E113593 Type 2 diabetes mellitus with proliferative diabetic retinopathy without macular edema, bilateral: Secondary | ICD-10-CM | POA: Diagnosis not present

## 2019-09-14 DIAGNOSIS — H353134 Nonexudative age-related macular degeneration, bilateral, advanced atrophic with subfoveal involvement: Secondary | ICD-10-CM | POA: Diagnosis not present

## 2019-09-14 LAB — HM DIABETES EYE EXAM

## 2019-09-19 DIAGNOSIS — R69 Illness, unspecified: Secondary | ICD-10-CM | POA: Diagnosis not present

## 2019-10-12 ENCOUNTER — Other Ambulatory Visit: Payer: Self-pay | Admitting: Family Medicine

## 2019-10-16 DIAGNOSIS — R69 Illness, unspecified: Secondary | ICD-10-CM | POA: Diagnosis not present

## 2019-11-16 ENCOUNTER — Ambulatory Visit: Payer: Medicare HMO | Admitting: Family Medicine

## 2019-11-22 ENCOUNTER — Other Ambulatory Visit: Payer: Self-pay

## 2019-11-23 ENCOUNTER — Ambulatory Visit (INDEPENDENT_AMBULATORY_CARE_PROVIDER_SITE_OTHER): Payer: Medicare HMO | Admitting: Family Medicine

## 2019-11-23 ENCOUNTER — Encounter: Payer: Self-pay | Admitting: Family Medicine

## 2019-11-23 VITALS — BP 150/70 | HR 57 | Temp 97.7°F | Ht 61.0 in | Wt 134.2 lb

## 2019-11-23 DIAGNOSIS — E785 Hyperlipidemia, unspecified: Secondary | ICD-10-CM | POA: Diagnosis not present

## 2019-11-23 DIAGNOSIS — I10 Essential (primary) hypertension: Secondary | ICD-10-CM | POA: Diagnosis not present

## 2019-11-23 DIAGNOSIS — L84 Corns and callosities: Secondary | ICD-10-CM | POA: Diagnosis not present

## 2019-11-23 DIAGNOSIS — G4700 Insomnia, unspecified: Secondary | ICD-10-CM | POA: Diagnosis not present

## 2019-11-23 DIAGNOSIS — N182 Chronic kidney disease, stage 2 (mild): Secondary | ICD-10-CM

## 2019-11-23 DIAGNOSIS — E1169 Type 2 diabetes mellitus with other specified complication: Secondary | ICD-10-CM

## 2019-11-23 DIAGNOSIS — E11319 Type 2 diabetes mellitus with unspecified diabetic retinopathy without macular edema: Secondary | ICD-10-CM | POA: Diagnosis not present

## 2019-11-23 DIAGNOSIS — I152 Hypertension secondary to endocrine disorders: Secondary | ICD-10-CM

## 2019-11-23 DIAGNOSIS — E1159 Type 2 diabetes mellitus with other circulatory complications: Secondary | ICD-10-CM | POA: Diagnosis not present

## 2019-11-23 DIAGNOSIS — E1351 Other specified diabetes mellitus with diabetic peripheral angiopathy without gangrene: Secondary | ICD-10-CM | POA: Diagnosis not present

## 2019-11-23 DIAGNOSIS — L602 Onychogryphosis: Secondary | ICD-10-CM | POA: Diagnosis not present

## 2019-11-23 LAB — CBC WITH DIFFERENTIAL/PLATELET
Basophils Absolute: 0.2 10*3/uL — ABNORMAL HIGH (ref 0.0–0.1)
Basophils Relative: 2.5 % (ref 0.0–3.0)
Eosinophils Absolute: 0.1 10*3/uL (ref 0.0–0.7)
Eosinophils Relative: 1.3 % (ref 0.0–5.0)
HCT: 36.3 % (ref 36.0–46.0)
Hemoglobin: 11.7 g/dL — ABNORMAL LOW (ref 12.0–15.0)
Lymphocytes Relative: 23.5 % (ref 12.0–46.0)
Lymphs Abs: 1.6 10*3/uL (ref 0.7–4.0)
MCHC: 32.2 g/dL (ref 30.0–36.0)
MCV: 93.8 fl (ref 78.0–100.0)
Monocytes Absolute: 0.6 10*3/uL (ref 0.1–1.0)
Monocytes Relative: 8.9 % (ref 3.0–12.0)
Neutro Abs: 4.3 10*3/uL (ref 1.4–7.7)
Neutrophils Relative %: 63.8 % (ref 43.0–77.0)
Platelets: 278 10*3/uL (ref 150.0–400.0)
RBC: 3.87 Mil/uL (ref 3.87–5.11)
RDW: 14.2 % (ref 11.5–15.5)
WBC: 6.7 10*3/uL (ref 4.0–10.5)

## 2019-11-23 LAB — COMPREHENSIVE METABOLIC PANEL
ALT: 11 U/L (ref 0–35)
AST: 16 U/L (ref 0–37)
Albumin: 4.4 g/dL (ref 3.5–5.2)
Alkaline Phosphatase: 73 U/L (ref 39–117)
BUN: 18 mg/dL (ref 6–23)
CO2: 26 mEq/L (ref 19–32)
Calcium: 10 mg/dL (ref 8.4–10.5)
Chloride: 104 mEq/L (ref 96–112)
Creatinine, Ser: 0.92 mg/dL (ref 0.40–1.20)
GFR: 57.79 mL/min — ABNORMAL LOW (ref >60.00)
Glucose, Bld: 129 mg/dL — ABNORMAL HIGH (ref 70–99)
Potassium: 5.1 mEq/L (ref 3.5–5.1)
Sodium: 138 mEq/L (ref 135–145)
Total Bilirubin: 0.6 mg/dL (ref 0.2–1.2)
Total Protein: 7.4 g/dL (ref 6.0–8.3)

## 2019-11-23 LAB — HEMOGLOBIN A1C: Hgb A1c MFr Bld: 6.7 % — ABNORMAL HIGH (ref 4.6–6.5)

## 2019-11-23 MED ORDER — ZOLPIDEM TARTRATE 5 MG PO TABS: 5.0000 mg | ORAL_TABLET | Freq: Every evening | ORAL | 5 refills | Status: DC | PRN

## 2019-11-23 NOTE — Progress Notes (Addendum)
Phone [phone] In person visit   Subjective:   Laura Salas is a 86 y.o. year old very pleasant female patient who presents for/with See problem oriented charting Chief Complaint  Patient presents with  . Follow-up  . Diabetes  . Hypertension  . Hyperlipidemia   This visit occurred during the SARS-CoV-2 public health emergency.  Safety protocols were in place, including screening questions prior to the visit, additional usage of staff PPE, and extensive cleaning of exam room while observing appropriate contact time as indicated for disinfecting solutions.   Past Medical History-  Patient Active Problem List   Diagnosis Date Noted  . Diabetic retinopathy ([place]) 07/11/2015    Priority: High  . Diabetes Type II with retinopathy 05/15/2007    Priority: High  . PAC (premature atrial contraction) 07/11/2015    Priority: Medium  . CKD (chronic kidney disease), stage II 07/05/2014    Priority: Medium  . Insomnia 06/14/2008    Priority: Medium  . Hyperlipidemia associated with type 2 diabetes mellitus ([place]) 05/15/2007    Priority: Medium  . Hypertension associated with diabetes ([place]) 05/15/2007    Priority: Medium  . Osteoarthritis of both knees 02/17/2018    Priority: Low  . Osteoporosis 12/12/2009    Priority: Low  . INTENTION TREMOR 10/11/2008    Priority: Low  . DEPRESSION 06/14/2008    Priority: Low  . ANEMIA, OTHER UNSPEC 01/03/2008    Priority: Low    Medications- reviewed and updated Current Outpatient Medications  Medication Sig Dispense Refill  . amLODipine (NORVASC) 5 MG tablet TAKE 1 TABLET BY MOUTH EVERY DAY 90 tablet 3  . blood glucose meter kit and supplies KIT Dispense based on patient and insurance preference. Use to check blood sugar daily. One Touch glucometer E11.9 1 each 0  . diclofenac sodium (VOLTAREN) 1 % GEL Apply 2 g topically 4 (four) times daily. 100 g 5  . glucose blood test strip Use to test blood sugar daily and PRN 100 each 12  .  Lancets (ONETOUCH ULTRASOFT) lancets Use to test blood sugar daily and PRN 100 each 12  . metFORMIN (GLUCOPHAGE) 850 MG tablet TAKE 1 TABLET BY MOUTH TWICE A DAY 180 tablet 1  . simvastatin (ZOCOR) 40 MG tablet TAKE 1 TABLET BY MOUTH EVERYDAY AT BEDTIME 90 tablet 1  . zolpidem (AMBIEN) 5 MG tablet Take 1 tablet (5 mg total) by mouth at bedtime as needed. for sleep 31 tablet 5   No current facility-administered medications for this visit.     Objective:  BP (!) 150/70   Pulse (!) 57   Temp 97.7 F (36.5 C)   Ht '5\' 1"'$  (1.549 m)   Wt 134 lb 3.2 oz (60.9 kg)   SpO2 96%   BMI 25.36 kg/m  Gen: NAD, resting comfortably CV: RRR no murmurs rubs or gallops Lungs: CTAB no crackles, wheeze, rhonch Ext: no edema Skin: warm, dry Neuro: walks without assistance  Diabetic Foot Exam - Simple   Simple Foot Form Diabetic Foot exam was performed with the following findings: Yes 11/23/2019  9:34 AM  Visual Inspection No deformities, no ulcerations, no other skin breakdown bilaterally: Yes Sensation Testing Intact to touch and monofilament testing bilaterally: Yes Pulse Check Posterior Tibialis and Dorsalis pulse intact bilaterally: Yes Comments        Assessment and Plan   #hypertension S: compliant with amlodipine '5mg'$ - takes in Am. Home check 126/69 within 24 hours BP Readings from Last 3 Encounters:  11/23/19 Marland Kitchen)  150/70  08/17/19 128/64  05/18/19 136/80  A/P: blood pressure higher than normal in office today. I do not want to change meds but lets try amlodipine at nighttime instead of morning.  -Saturday half amlodipine in morning and half before bed - Sunday full tablet before bed.  - update [place] with blood pressures on daily basis after 1 week of this change  # Diabetes S: Compliant with metformin '850mg'$  twice a day CBGs- been running good, this morning it was 99 Exercise and diet- pt is eating at home and doing some chair exercises or walking outside if weather is good Lab  Results  Component Value Date   HGBA1C 6.7 (H) 05/18/2019   HGBA1C 6.6 (H) 08/25/2018   HGBA1C 6.7 (H) 02/17/2018   A/P: Suspect well controlled-update A1c with labs today -follows with optho for retinopathy history  #hyperlipidemia S: compliant with Simvastatin '40mg'$  Lab Results  Component Value Date   CHOL 146 05/18/2019   HDL 73.40 05/18/2019   LDLCALC 57 05/18/2019   LDLDIRECT 61.0 01/03/2015   TRIG 80.0 05/18/2019   CHOLHDL 2 05/18/2019   A/P: Excellent control on last check LDL under 70-we will update lipid panel next visit  #Insomnia-patient doing well on Ambien 5 mg-takes this.  We will continue current medication.  89-monthsupply sent in today. No falls/instability/abnormal dreams  #possible CKD III- tylenol only orally. Update GFR. She uses voltaren ge some- advised to at least try before bed as that is a time that bothers her  Recommended follow up: 6 months as long as blood pressure comes down Future Appointments  Date Time Provider [place] 09/26/2020  9:15 AM MHayden Pedro MD TRE-TRE None   Lab/Order associations:   ICD-10-CM   1. Type 2 diabetes mellitus with retinopathy, without long-term current use of insulin, macular edema presence unspecified, unspecified laterality, unspecified retinopathy severity (HCC)  E11.319 CBC with Differential/Platelet    Comprehensive metabolic panel    Hemoglobin A1c  2. Hypertension associated with diabetes ([place]  E11.59    I10   3. Hyperlipidemia associated with type 2 diabetes mellitus ([place]  E11.69    E78.5   4. Diabetic retinopathy of both eyes associated with type 2 diabetes mellitus, macular edema presence unspecified, unspecified retinopathy severity ([place]  E11.319   5. Insomnia, unspecified type  G47.00     Meds ordered this encounter  Medications  . zolpidem (AMBIEN) 5 MG tablet    Sig: Take 1 tablet (5 mg total) by mouth at bedtime as needed. for sleep    Dispense:  31 tablet    Refill:  5    This  request is for a new prescription for a controlled substance as required by Federal/State law..    Return precautions advised.  SGarret Reddish MD

## 2019-11-23 NOTE — Patient Instructions (Addendum)
Health Maintenance Due  Topic Date Due  . FOOT EXAM -today normal  07/29/2019  . HEMOGLOBIN A1C -today 11/18/2019   blood pressure higher than normal in office today. I do not want to change meds but lets try amlodipine at nighttime instead of morning.  -Saturday half amlodipine in morning and half before bed - Sunday full tablet before bed.  - update [place] with blood pressures on daily basis after 1 week of this change  Please check with your pharmacy to see if they have the shingrix vaccine. If they do- please get this immunization and update [place] by phone call or mychart with dates you receive the vaccine  [email or web address]  Please stop by lab before you go If you do not have mychart- we will call you about results within 5 business days of [place] receiving them.  If you have mychart- we will send your results within 3 business days of [place] receiving them.  If abnormal or we want to clarify a result, we will call or mychart you to make sure you receive the message.  If you have questions or concerns or don't hear within 5-7 days, please send [place] a message or call us.   Recommended follow up: 6 months as long as blood pressure comes down

## 2019-11-24 ENCOUNTER — Encounter: Payer: Self-pay | Admitting: Family Medicine

## 2019-11-30 ENCOUNTER — Telehealth: Payer: Self-pay

## 2019-11-30 NOTE — Telephone Encounter (Signed)
Numbers look great-please addend the last note and include blood pressure see listed as well as my note here.  Please encourage her to continue current medications.  Please update the last note with her most recent blood pressure and in the comments right "home reading"

## 2019-11-30 NOTE — Telephone Encounter (Signed)
Just want to make sure I understand you want me to add this to her 11/23/2019 note?

## 2019-11-30 NOTE — Telephone Encounter (Signed)
Yes- thanks so much

## 2019-11-30 NOTE — Telephone Encounter (Signed)
Patient is calling to provide results for BP  Saturday 11/24/19- 129/75 11/25/19-119/68 11/26/19-121/72 11/27/19-116/73 11/28/19-122/65 11/29/19-121/70 11/30/19-123/72

## 2019-11-30 NOTE — Telephone Encounter (Signed)
Please advise 

## 2019-12-10 ENCOUNTER — Other Ambulatory Visit: Payer: Self-pay | Admitting: Family Medicine

## 2019-12-15 ENCOUNTER — Ambulatory Visit: Payer: Medicare HMO | Attending: Internal Medicine

## 2019-12-15 DIAGNOSIS — Z23 Encounter for immunization: Secondary | ICD-10-CM | POA: Insufficient documentation

## 2019-12-15 NOTE — Progress Notes (Signed)
   Covid-19 Vaccination Clinic  Name:  Laura Salas    MRN: [id number] DOB: 1930/12/14  12/15/2019  [name] was observed post Covid-19 immunization for 15 minutes without incidence. She was provided with Vaccine Information Sheet and instruction to access the V-Safe system.   [name] was instructed to call 911 with any severe reactions post vaccine: Marland Kitchen Difficulty breathing  . Swelling of your face and throat  . A fast heartbeat  . A bad rash all over your body  . Dizziness and weakness    Immunizations Administered    Name Date Dose VIS Date Route   Pfizer COVID-19 Vaccine 12/15/2019  9:14 AM 0.3 mL 10/05/2019 Intramuscular   Manufacturer: [place]   Lot: [id number]   [place]: [id number]

## 2019-12-19 DIAGNOSIS — R69 Illness, unspecified: Secondary | ICD-10-CM | POA: Diagnosis not present

## 2020-01-05 DIAGNOSIS — R69 Illness, unspecified: Secondary | ICD-10-CM | POA: Diagnosis not present

## 2020-01-07 ENCOUNTER — Other Ambulatory Visit: Payer: Self-pay

## 2020-01-07 ENCOUNTER — Ambulatory Visit: Payer: Medicare HMO | Attending: Internal Medicine

## 2020-01-07 ENCOUNTER — Other Ambulatory Visit: Payer: Self-pay | Admitting: Family Medicine

## 2020-01-07 DIAGNOSIS — Z23 Encounter for immunization: Secondary | ICD-10-CM

## 2020-01-07 MED ORDER — SIMVASTATIN 40 MG PO TABS: ORAL_TABLET | ORAL | 1 refills | Status: DC

## 2020-01-07 NOTE — Progress Notes (Signed)
   Covid-19 Vaccination Clinic  Name:  Laura Salas    MRN: [id number] DOB: 1931-07-02  01/07/2020  [name] was observed post Covid-19 immunization for 15 minutes without incident. She was provided with Vaccine Information Sheet and instruction to access the V-Safe system.   [name] was instructed to call 911 with any severe reactions post vaccine: Marland Kitchen Difficulty breathing  . Swelling of face and throat  . A fast heartbeat  . A bad rash all over body  . Dizziness and weakness   Immunizations Administered    Name Date Dose VIS Date Route   Pfizer COVID-19 Vaccine 01/07/2020  9:06 AM 0.3 mL 10/05/2019 Intramuscular   Manufacturer: [place]   Lot: [id number]   [place]: [id number]

## 2020-01-14 ENCOUNTER — Telehealth: Payer: Self-pay

## 2020-01-14 NOTE — Telephone Encounter (Signed)
Received an approval for Zolpidem. Approved through 10-24-2020.

## 2020-01-14 NOTE — Telephone Encounter (Signed)
Tranae Leavens KeyDonne Hazel - PA Case [id number] Need help? Call us at [phone] Status Sent to [facility] Zolpidem Tartrate 5MG  tablets Form [employer] Electronic PA Form

## 2020-02-15 DIAGNOSIS — E1351 Other specified diabetes mellitus with diabetic peripheral angiopathy without gangrene: Secondary | ICD-10-CM | POA: Diagnosis not present

## 2020-02-15 DIAGNOSIS — L602 Onychogryphosis: Secondary | ICD-10-CM | POA: Diagnosis not present

## 2020-03-06 ENCOUNTER — Other Ambulatory Visit: Payer: Self-pay | Admitting: Family Medicine

## 2020-03-06 NOTE — Telephone Encounter (Signed)
Last OV 11/23/19 Last refill 10/17/19 #180/1 Next OV 05/30/20

## 2020-04-21 DIAGNOSIS — Z961 Presence of intraocular lens: Secondary | ICD-10-CM | POA: Diagnosis not present

## 2020-04-21 DIAGNOSIS — E119 Type 2 diabetes mellitus without complications: Secondary | ICD-10-CM | POA: Diagnosis not present

## 2020-04-21 LAB — HM DIABETES EYE EXAM

## 2020-04-22 DIAGNOSIS — J3489 Other specified disorders of nose and nasal sinuses: Secondary | ICD-10-CM | POA: Diagnosis not present

## 2020-04-22 DIAGNOSIS — H0520 Unspecified exophthalmos: Secondary | ICD-10-CM | POA: Diagnosis not present

## 2020-04-22 DIAGNOSIS — M8588 Other specified disorders of bone density and structure, other site: Secondary | ICD-10-CM | POA: Diagnosis not present

## 2020-04-22 DIAGNOSIS — M799 Soft tissue disorder, unspecified: Secondary | ICD-10-CM | POA: Diagnosis not present

## 2020-04-25 ENCOUNTER — Other Ambulatory Visit: Payer: Self-pay

## 2020-04-25 DIAGNOSIS — R69 Illness, unspecified: Secondary | ICD-10-CM | POA: Diagnosis not present

## 2020-04-25 MED ORDER — GLUCOSE BLOOD VI STRP: ORAL_STRIP | 3 refills | Status: DC

## 2020-04-25 MED ORDER — ONETOUCH ULTRASOFT LANCETS MISC: 3 refills | Status: DC

## 2020-04-29 ENCOUNTER — Telehealth: Payer: Self-pay | Admitting: Family Medicine

## 2020-04-29 DIAGNOSIS — J3489 Other specified disorders of nose and nasal sinuses: Secondary | ICD-10-CM | POA: Diagnosis not present

## 2020-04-29 DIAGNOSIS — H0589 Other disorders of orbit: Secondary | ICD-10-CM | POA: Diagnosis not present

## 2020-04-29 NOTE — Telephone Encounter (Signed)
  LAST APPOINTMENT DATE: 03/06/2020   NEXT APPOINTMENT DATE:@8 /03/2020  MEDICATION: Lancets (ONETOUCH ULTRASOFT) lancets // glucose blood test strip  PHARMACY: [facility], [place] Phone:  [phone]  Fax:  [phone]     Comments: States that they need an authorization from [place] before refilling these. Patient also says that she has at least a week supply.

## 2020-04-29 NOTE — Telephone Encounter (Signed)
Patient asked if someone could give her a call once it is sent in,

## 2020-04-30 MED ORDER — GLUCOSE BLOOD VI STRP: ORAL_STRIP | 3 refills | Status: DC

## 2020-04-30 MED ORDER — ONETOUCH ULTRASOFT LANCETS MISC: 3 refills | Status: DC

## 2020-04-30 NOTE — Telephone Encounter (Signed)
Called and let patient know has been sent in on 7/2 and today

## 2020-04-30 NOTE — Addendum Note (Signed)
Addended by: Clyde Lundborg A on: 04/30/2020 08:40 AM   Modules accepted: Orders

## 2020-04-30 NOTE — Telephone Encounter (Signed)
Rx sent in to [facility]. Lm on pt vm making aware.

## 2020-05-01 ENCOUNTER — Encounter: Payer: Self-pay | Admitting: Family Medicine

## 2020-05-06 DIAGNOSIS — C300 Malignant neoplasm of nasal cavity: Secondary | ICD-10-CM | POA: Diagnosis not present

## 2020-05-06 DIAGNOSIS — J3489 Other specified disorders of nose and nasal sinuses: Secondary | ICD-10-CM | POA: Diagnosis not present

## 2020-05-06 DIAGNOSIS — H0589 Other disorders of orbit: Secondary | ICD-10-CM | POA: Diagnosis not present

## 2020-05-10 DIAGNOSIS — R69 Illness, unspecified: Secondary | ICD-10-CM | POA: Diagnosis not present

## 2020-05-19 DIAGNOSIS — C300 Malignant neoplasm of nasal cavity: Secondary | ICD-10-CM | POA: Diagnosis not present

## 2020-05-20 DIAGNOSIS — C311 Malignant neoplasm of ethmoidal sinus: Secondary | ICD-10-CM | POA: Diagnosis not present

## 2020-05-20 DIAGNOSIS — H0589 Other disorders of orbit: Secondary | ICD-10-CM | POA: Diagnosis not present

## 2020-05-20 DIAGNOSIS — C319 Malignant neoplasm of accessory sinus, unspecified: Secondary | ICD-10-CM | POA: Diagnosis not present

## 2020-05-20 DIAGNOSIS — H5712 Ocular pain, left eye: Secondary | ICD-10-CM | POA: Diagnosis not present

## 2020-05-20 DIAGNOSIS — J3489 Other specified disorders of nose and nasal sinuses: Secondary | ICD-10-CM | POA: Diagnosis not present

## 2020-05-20 DIAGNOSIS — C300 Malignant neoplasm of nasal cavity: Secondary | ICD-10-CM | POA: Diagnosis not present

## 2020-05-23 ENCOUNTER — Ambulatory Visit: Payer: Medicare HMO | Admitting: Family Medicine

## 2020-05-29 DIAGNOSIS — C300 Malignant neoplasm of nasal cavity: Secondary | ICD-10-CM | POA: Diagnosis not present

## 2020-05-29 NOTE — Patient Instructions (Addendum)
Health Maintenance Due  Topic Date Due  . TETANUS/TDAP-recommended getting this done at her pharmacy 04/17/2020  . URINE MICROALBUMIN and a1c -today 05/17/2020  . Shingrix (for shingles)-team please get dates of this from [facility]  05/22/2020  . INFLUENZA VACCINE-recommended flu shot in the fall 05/25/2020   Please stop by lab before you go If you have mychart- we will send your results within 3 business days of [place] receiving them.  If you do not have mychart- we will call you about results within 5 business days of [place] receiving them.  *please note we are currently using [facility] which has a longer processing time than [place] typically so labs may not come back as quickly as in the past *please also note that you will see labs on mychart as soon as they post. I will later go in and write notes on them- will say "notes from Dr. Yong Channel"  Offered physical therapy for gait and balance training- she wanted to think about this for now but will call back if decides she wants to move forward

## 2020-05-29 NOTE — Progress Notes (Signed)
Phone [phone]   Subjective:  Patient presents today for their annual physical. Chief complaint-noted.   See problem oriented charting- Review of Systems  Constitutional: Negative for chills and fever.  HENT: Positive for hearing loss (no drastic change). Negative for sore throat.   Eyes: Positive for blurred vision. Negative for double vision.  Respiratory: Negative for cough and shortness of breath.   Cardiovascular: Negative for chest pain and palpitations.  Gastrointestinal: Negative for constipation, diarrhea, nausea and vomiting.  Genitourinary: Negative for dysuria and frequency.  Musculoskeletal: Negative for falls and myalgias.  Skin: Negative for itching and rash.  Neurological: Negative for headaches.       Some imbalance   Endo/Heme/Allergies: Negative for polydipsia. Does not bruise/bleed easily.  Psychiatric/Behavioral: Negative for depression and suicidal ideas.    The following were reviewed and entered/updated in epic: Past Medical History:  Diagnosis Date  . Depression   . Diabetes mellitus   . Hyperlipidemia   . Hypertension   . Osteoporosis    Patient Active Problem List   Diagnosis Date Noted  . Squamous cell carcinoma, nonkeratinizing 05/30/2020    Priority: High  . Diabetic retinopathy (HCC) 07/11/2015    Priority: High  . Diabetes Type II with retinopathy 05/15/2007    Priority: High  . PAC (premature atrial contraction) 07/11/2015    Priority: Medium  . CKD (chronic kidney disease), stage III 07/05/2014    Priority: Medium  . Insomnia 06/14/2008    Priority: Medium  . Hyperlipidemia associated with type 2 diabetes mellitus (HCC) 05/15/2007    Priority: Medium  . Hypertension associated with diabetes (HCC) 05/15/2007    Priority: Medium  . Osteoarthritis of both knees 02/17/2018    Priority: Low  . Osteoporosis 12/12/2009    Priority: Low  . INTENTION TREMOR 10/11/2008    Priority: Low  . History of depression 06/14/2008     Priority: Low  . ANEMIA, OTHER UNSPEC 01/03/2008    Priority: Low   Past Surgical History:  Procedure Laterality Date  . CARPAL TUNNEL RELEASE     left  . CATARACT EXTRACTION    . CESAREAN SECTION      Family History  Problem Relation Age of Onset  . Cancer Mother        colon  . Heart attack Father   . Heart attack Brother   . Cancer Brother        stomach  . Other Sister        died at 25    Medications- reviewed and updated Current Outpatient Medications  Medication Sig Dispense Refill  . amLODipine (NORVASC) 5 MG tablet TAKE 1 TABLET BY MOUTH EVERY DAY 90 tablet 3  . blood glucose meter kit and supplies KIT Dispense based on patient and insurance preference. Use to check blood sugar daily. One Touch glucometer E11.9 1 each 0  . diclofenac sodium (VOLTAREN) 1 % GEL Apply 2 g topically 4 (four) times daily. 100 g 5  . glucose blood test strip Use to test blood sugar daily and PRN 100 each 3  . Lancets (ONETOUCH ULTRASOFT) lancets Use to test blood sugar daily and PRN 100 each 3  . metFORMIN (GLUCOPHAGE) 850 MG tablet TAKE 1 TABLET BY MOUTH TWICE A DAY 180 tablet 1  . simvastatin (ZOCOR) 40 MG tablet Take 1 tab at bedtime 90 tablet 1  . zolpidem (AMBIEN) 5 MG tablet Take 1 tablet (5 mg total) by mouth at bedtime as needed. for sleep 31 tablet 5  No current facility-administered medications for this visit.    Allergies-reviewed and updated Allergies  Allergen Reactions  . Alendronate Sodium     REACTION: upset stomach, joint and muscle pain  . Codeine Sulfate     REACTION: dizziness, "put her to bed"  . Ibandronate Sodium     REACTION: upset stomach, joint and muscle pain    Social History   Social History Narrative   Grew up in [place], moved to [place] around 2005. Widowed 10 years ago of "emboli". Only son is a Company secretary at [employer] so moved close for that reason.  2 grown grandchildren (grandson is in [place]; granddaughter in grad school  for physical therapy)       Lives in home with son but has separate portion of home. Eats dinner together at time.       Hobbies: shopping, movies, [place] visiting 2 sisters   Objective  Objective:  BP 140/62   Pulse (!) 105   Temp 98 F (36.7 C)   Ht '5\' 1"'$  (1.549 m)   Wt 129 lb 3.2 oz (58.6 kg)   SpO2 97%   BMI 24.41 kg/m  Gen: NAD, resting comfortably HEENT: Mucous membranes are moist. Oropharynx normal. Protrusion forward of left eye from baseine Neck: no thyromegaly CV: RRR no murmurs rubs or gallops Lungs: CTAB no crackles, wheeze, rhonchi Abdomen: soft/nontender/nondistended/normal bowel sounds. No rebound or guarding.  Ext: no edema Skin: warm, dry Neuro: grossly normal, moves all extremities, PERRLA   Assessment and Plan   86 y.o. female presenting for annual physical.  Health Maintenance counseling: 1. Anticipatory guidance: Patient counseled regarding regular dental exams -q6 months, eye exams -follows with Dr. Zigmund Daniel,  avoiding smoking and second hand smoke , limiting alcohol to 1 beverage per day-does not drink alcohol.   2. Risk factor reduction:  Advised patient of need for regular exercise and diet rich and fruits and vegetables to reduce risk of heart attack and stroke. Exercise- staying active. Diet-reasonably healthy diet. Weight down a few lbs since cancer diagnosis- some decreased appetite.  Wt Readings from Last 3 Encounters:  05/30/20 129 lb 3.2 oz (58.6 kg)  11/23/19 134 lb 3.2 oz (60.9 kg)  08/17/19 136 lb 3.2 oz (61.8 kg)  3. Immunizations/screenings/ancillary studies-discussed Shingrix at her pharmacy- she already had this  Immunization History  Administered Date(s) Administered  . Fluad Quad(high Dose 65+) 08/17/2019  . H1N1 10/11/2008  . Influenza Split 08/06/2011, 08/13/2011, 08/11/2012  . Influenza Whole 08/25/2007, 08/09/2008, 08/01/2009, 07/24/2010  . Influenza, High Dose Seasonal PF 07/16/2016, 07/15/2017, 07/28/2018  .  Influenza,inj,Quad PF,6+ Mos 06/29/2013, 07/05/2014, 07/11/2015  . PFIZER SARS-COV-2 Vaccination 12/15/2019, 01/07/2020  . Pneumococcal Conjugate-13 01/03/2015  . Pneumococcal Polysaccharide-23 11/10/2007  . Td 04/17/2010  . Zoster 04/17/2010   Health Maintenance Due  Topic Date Due  . TETANUS/TDAP-recommended getting this done at her pharmacy 04/17/2020  . URINE MICROALBUMIN-today 05/17/2020  . HEMOGLOBIN A1C-today 05/22/2020  . INFLUENZA VACCINE-recommended flu shot in the fall 05/25/2020   4. Cervical cancer screening- past age based screening recommendations 5. Breast cancer screening-past age based screening recommendations-last mammogram 2011 6. Colon cancer screening - past age based screening recommendations. No blood in the stool. 2009 last examination-no further colonoscopy due to age 24. Skin cancer screening-follows with Dr. Renda Rolls. advised regular sunscreen use. Denies worrisome, changing, or new skin lesions.  8. Birth control/STD check- not sexually active and postmenopausal 9. Osteoporosis screening at 56- osteoporosis has been noted in the past-patient has  declined repeat bone density testing. She does agree to continue calcium and vitamin D. -Never smoker  Status of chronic or acute concerns   # social update- lives in her own home- son is in attached portion but she has not required assist.   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 Diagnosed as nonkeatinizing squamous cell carcinoma.  MRI brain with invasion into anterior cranial fossa.   Deemed unresectable by ENT with [facility] 05/20/20 and not amenable to curative intent radiation.plan is oncology referral to consider palliative immunotherapy.  She states she met with oncology yesterday - I do not see those notes yet A/P: very trying time for patient  recently- in good hands with [facility] but sounds like options are limited- whe will continue to follow up with them- oncology is considering immunotherapy- apparently based off of lab she is getting done - good family support and she feels she is doing well overall -her faith is important to her  # Diabetes with retinopathy S: Medication:metformin 850 mg twice daily CBGs- higher with stress from cancer workup She follows up closely with ophthalmology for retinopathy  Lab Results  Component Value Date   HGBA1C 6.7 (H) 11/23/2019   HGBA1C 6.7 (H) 05/18/2019   HGBA1C 6.6 (H) 08/25/2018  A/P: Diabetes-hopefully stable- update a1c . With cancer issues ongoing- will use a1c goal of 8  Retinopathy-continue optho follow up once yearly   #hyperlipidemia. LDL under 70 goal S: Medication:Simvastatin 40 mg Lab Results  Component Value Date   CHOL 146 05/18/2019   HDL 73.40 05/18/2019   LDLCALC 57 05/18/2019   LDLDIRECT 61.0 01/03/2015   TRIG 80.0 05/18/2019   CHOLHDL 2 05/18/2019    A/P: hopefully Stable. Continue current medications as long as LDL ,100  #hypertension S: medication: Amlodipine 5 mg Home readings #s: 124 at home this AM systolic  BP Readings from Last 3 Encounters:  05/30/20 140/62  11/23/19 (!) 150/70  08/17/19 128/64   A/P: hair high today- has white coat element- looked good this morning- continue current medicines with good home control   #Chronic kidney disease stage III S: GFR is typically in the 50s range -Patient knows to avoid NSAIDs  A/P: hopefully stable- update cmp. Tylenol only.   -uses voltaren gel for joint aches- I think that's reasonable  #Insomnia-reasonable control on Ambien 5 mg- not as good as '10mg'$  but obviously safer. No recent falls-but we discussed the risk  #PACs-has been an issue her "whole life". PACs on prior EKGs  # some imbalance issues since cancer diagnosis- vision issues with cancer I think are contributing. Offered PT for gait and  balance training- she wanted to think about this for now but will call back if decides she wants to move forward  Recommended follow up: Return in about 6 months (around 11/30/2020) for follow up- or sooner if needed. Future Appointments  Date Time Provider [place]  09/26/2020  9:15 AM Hayden Pedro, MD TRE-TRE None    Lab/Order associations:   ICD-10-CM   1. Preventative health care  Z00.00 Microalbumin / creatinine urine ratio    CBC With Differential/Platelet    COMPLETE METABOLIC PANEL WITH GFR    Hemoglobin A1c    Lipid Panel (Refl)    Vitamin B12  2. Hypertension associated with diabetes ([place])  E11.59    I10   3.  Type 2 diabetes mellitus with retinopathy, without long-term current use of insulin, macular edema presence unspecified, unspecified laterality, unspecified retinopathy severity (HCC)  E11.319 Microalbumin / creatinine urine ratio    CBC With Differential/Platelet    COMPLETE METABOLIC PANEL WITH GFR    Hemoglobin A1c    Lipid Panel (Refl)  4. Hyperlipidemia associated with type 2 diabetes mellitus ([place])  E11.69    E78.5   5. Squamous cell carcinoma, nonkeratinizing  C44.92   6. Diabetic retinopathy of both eyes associated with type 2 diabetes mellitus, macular edema presence unspecified, unspecified retinopathy severity ([place])  E11.319   7. Stage 3 chronic kidney disease, unspecified whether stage 3a or 3b CKD  N18.30   8. Age-related osteoporosis without current pathological fracture  M81.0 VITAMIN D 25 Hydroxy (Vit-D Deficiency, Fractures)  9. High risk medication use  Z79.899 Vitamin B12    No orders of the defined types were placed in this encounter.  Return precautions advised.  Garret Reddish, MD

## 2020-05-30 ENCOUNTER — Other Ambulatory Visit: Payer: Self-pay | Admitting: Family Medicine

## 2020-05-30 ENCOUNTER — Ambulatory Visit: Payer: Medicare HMO | Admitting: Family Medicine

## 2020-05-30 ENCOUNTER — Other Ambulatory Visit: Payer: Self-pay

## 2020-05-30 ENCOUNTER — Encounter: Payer: Self-pay | Admitting: Family Medicine

## 2020-05-30 ENCOUNTER — Ambulatory Visit (INDEPENDENT_AMBULATORY_CARE_PROVIDER_SITE_OTHER): Payer: Medicare HMO | Admitting: Family Medicine

## 2020-05-30 VITALS — BP 140/62 | HR 96 | Temp 98.0°F | Ht 61.0 in | Wt 129.2 lb

## 2020-05-30 DIAGNOSIS — Z0000 Encounter for general adult medical examination without abnormal findings: Secondary | ICD-10-CM | POA: Diagnosis not present

## 2020-05-30 DIAGNOSIS — Z79899 Other long term (current) drug therapy: Secondary | ICD-10-CM

## 2020-05-30 DIAGNOSIS — I10 Essential (primary) hypertension: Secondary | ICD-10-CM | POA: Diagnosis not present

## 2020-05-30 DIAGNOSIS — E11319 Type 2 diabetes mellitus with unspecified diabetic retinopathy without macular edema: Secondary | ICD-10-CM | POA: Diagnosis not present

## 2020-05-30 DIAGNOSIS — E1169 Type 2 diabetes mellitus with other specified complication: Secondary | ICD-10-CM | POA: Diagnosis not present

## 2020-05-30 DIAGNOSIS — E1159 Type 2 diabetes mellitus with other circulatory complications: Secondary | ICD-10-CM

## 2020-05-30 DIAGNOSIS — E785 Hyperlipidemia, unspecified: Secondary | ICD-10-CM | POA: Diagnosis not present

## 2020-05-30 DIAGNOSIS — N183 Chronic kidney disease, stage 3 unspecified: Secondary | ICD-10-CM | POA: Diagnosis not present

## 2020-05-30 DIAGNOSIS — M810 Age-related osteoporosis without current pathological fracture: Secondary | ICD-10-CM | POA: Diagnosis not present

## 2020-05-30 DIAGNOSIS — C4492 Squamous cell carcinoma of skin, unspecified: Secondary | ICD-10-CM

## 2020-05-30 DIAGNOSIS — I152 Hypertension secondary to endocrine disorders: Secondary | ICD-10-CM

## 2020-05-30 NOTE — Addendum Note (Signed)
Addended by: Jerolyn Center on: 05/30/2020 10:02 AM   Modules accepted: Orders

## 2020-05-31 ENCOUNTER — Other Ambulatory Visit: Payer: Self-pay | Admitting: Family Medicine

## 2020-05-31 LAB — COMPLETE METABOLIC PANEL WITH GFR
AG Ratio: 1.6 (calc) (ref 1.0–2.5)
ALT: 9 U/L (ref 6–29)
AST: 16 U/L (ref 10–35)
Albumin: 4.2 g/dL (ref 3.6–5.1)
Alkaline phosphatase (APISO): 75 U/L (ref 37–153)
BUN/Creatinine Ratio: 19 (calc) (ref 6–22)
BUN: 19 mg/dL (ref 7–25)
CO2: 23 mmol/L (ref 20–32)
Calcium: 9.8 mg/dL (ref 8.6–10.4)
Chloride: 104 mmol/L (ref 98–110)
Creat: 0.98 mg/dL — ABNORMAL HIGH (ref 0.60–0.88)
GFR, Est African American: 60 mL/min/{1.73_m2} (ref >=60)
GFR, Est Non African American: 52 mL/min/{1.73_m2} — ABNORMAL LOW (ref >=60)
Globulin: 2.7 g/dL (calc) (ref 1.9–3.7)
Glucose, Bld: 142 mg/dL — ABNORMAL HIGH (ref 65–99)
Potassium: 4.6 mmol/L (ref 3.5–5.3)
Sodium: 139 mmol/L (ref 135–146)
Total Bilirubin: 0.5 mg/dL (ref 0.2–1.2)
Total Protein: 6.9 g/dL (ref 6.1–8.1)

## 2020-05-31 LAB — CBC WITH DIFFERENTIAL/PLATELET
Absolute Monocytes: 810 cells/uL (ref 200–950)
Basophils Absolute: 81 cells/uL (ref 0–200)
Basophils Relative: 0.9 %
Eosinophils Absolute: 63 cells/uL (ref 15–500)
Eosinophils Relative: 0.7 %
HCT: 34.5 % — ABNORMAL LOW (ref 35.0–45.0)
Hemoglobin: 11 g/dL — ABNORMAL LOW (ref 11.7–15.5)
Lymphs Abs: 1539 cells/uL (ref 850–3900)
MCH: 30.4 pg (ref 27.0–33.0)
MCHC: 31.9 g/dL — ABNORMAL LOW (ref 32.0–36.0)
MCV: 95.3 fL (ref 80.0–100.0)
MPV: 11.2 fL (ref 7.5–12.5)
Monocytes Relative: 9 %
Neutro Abs: 6507 cells/uL (ref 1500–7800)
Neutrophils Relative %: 72.3 %
Platelets: 306 10*3/uL (ref 140–400)
RBC: 3.62 10*6/uL — ABNORMAL LOW (ref 3.80–5.10)
RDW: 13.2 % (ref 11.0–15.0)
Total Lymphocyte: 17.1 %
WBC: 9 10*3/uL (ref 3.8–10.8)

## 2020-05-31 LAB — VITAMIN D 25 HYDROXY (VIT D DEFICIENCY, FRACTURES): Vit D, 25-Hydroxy: 13 ng/mL — ABNORMAL LOW (ref 30–100)

## 2020-05-31 LAB — VITAMIN B12: Vitamin B-12: 245 pg/mL (ref 200–1100)

## 2020-05-31 LAB — HEMOGLOBIN A1C
Hgb A1c MFr Bld: 6.8 % of total Hgb — ABNORMAL HIGH (ref <5.7)
Mean Plasma Glucose: 148 (calc)
eAG (mmol/L): 8.2 (calc)

## 2020-05-31 LAB — LIPID PANEL (REFL)
Cholesterol: 134 mg/dL (ref <200)
HDL: 62 mg/dL (ref >=50)
LDL Cholesterol (Calc): 56 mg/dL (calc)
Non-HDL Cholesterol (Calc): 72 mg/dL (calc) (ref <130)
Total CHOL/HDL Ratio: 2.2 (calc) (ref <5.0)
Triglycerides: 78 mg/dL (ref <150)

## 2020-05-31 LAB — MICROALBUMIN / CREATININE URINE RATIO
Creatinine, Urine: 145 mg/dL (ref 20–275)
Microalb Creat Ratio: 30 mcg/mg creat — ABNORMAL HIGH (ref <30)
Microalb, Ur: 4.3 mg/dL

## 2020-05-31 MED ORDER — VITAMIN D (ERGOCALCIFEROL) 1.25 MG (50000 UNIT) PO CAPS: 50000.0000 [IU] | ORAL_CAPSULE | ORAL | 1 refills | Status: DC

## 2020-06-02 ENCOUNTER — Other Ambulatory Visit: Payer: Self-pay

## 2020-06-02 ENCOUNTER — Telehealth: Payer: Self-pay | Admitting: Family Medicine

## 2020-06-02 DIAGNOSIS — R69 Illness, unspecified: Secondary | ICD-10-CM | POA: Diagnosis not present

## 2020-06-02 MED ORDER — LOSARTAN POTASSIUM 50 MG PO TABS
50.0000 mg | ORAL_TABLET | Freq: Every day | ORAL | 3 refills | Status: DC
Start: 2020-06-02 — End: 2020-07-04

## 2020-06-02 MED ORDER — ZOLPIDEM TARTRATE 5 MG PO TABS: 5.0000 mg | ORAL_TABLET | Freq: Every evening | ORAL | 5 refills | Status: DC | PRN

## 2020-06-02 NOTE — Progress Notes (Signed)
  Chronic Care Management   Note  06/02/2020 Name: Laura Salas MRN: [id number] DOB: 1930/12/15  Laura Salas is a 86 y.o. year old female who is a primary care patient of Marin Olp, MD. I reached out to Laura Salas by phone today in response to a referral sent by Laura Salas's PCP, Marin Olp, MD.   Laura Salas was given information about Chronic Care Management services today including:  1. CCM service includes personalized support from designated clinical staff supervised by her physician, including individualized plan of care and coordination with other care providers 2. 24/7 contact phone numbers for assistance for urgent and routine care needs. 3. Service will only be billed when office clinical staff spend 20 minutes or more in a month to coordinate care. 4. Only one practitioner may furnish and bill the service in a calendar month. 5. The patient may stop CCM services at any time (effective at the end of the month) by phone call to the office staff.   Patient agreed to services and verbal consent obtained.   Follow up plan:   Earney [place] [facility] Scheduler

## 2020-06-02 NOTE — Telephone Encounter (Signed)
Last refill: 11/23/19 #31, 5 Last OV: 05/30/20 dx. CPE

## 2020-06-06 DIAGNOSIS — L84 Corns and callosities: Secondary | ICD-10-CM | POA: Diagnosis not present

## 2020-06-06 DIAGNOSIS — L602 Onychogryphosis: Secondary | ICD-10-CM | POA: Diagnosis not present

## 2020-06-09 DIAGNOSIS — C300 Malignant neoplasm of nasal cavity: Secondary | ICD-10-CM | POA: Diagnosis not present

## 2020-06-11 DIAGNOSIS — C300 Malignant neoplasm of nasal cavity: Secondary | ICD-10-CM | POA: Diagnosis not present

## 2020-06-11 DIAGNOSIS — Z5112 Encounter for antineoplastic immunotherapy: Secondary | ICD-10-CM | POA: Diagnosis not present

## 2020-07-02 DIAGNOSIS — E11319 Type 2 diabetes mellitus with unspecified diabetic retinopathy without macular edema: Secondary | ICD-10-CM | POA: Diagnosis not present

## 2020-07-02 DIAGNOSIS — Z5112 Encounter for antineoplastic immunotherapy: Secondary | ICD-10-CM | POA: Diagnosis not present

## 2020-07-02 DIAGNOSIS — G893 Neoplasm related pain (acute) (chronic): Secondary | ICD-10-CM | POA: Diagnosis not present

## 2020-07-02 DIAGNOSIS — Z5111 Encounter for antineoplastic chemotherapy: Secondary | ICD-10-CM | POA: Diagnosis not present

## 2020-07-02 DIAGNOSIS — C760 Malignant neoplasm of head, face and neck: Secondary | ICD-10-CM | POA: Diagnosis not present

## 2020-07-02 DIAGNOSIS — Z79899 Other long term (current) drug therapy: Secondary | ICD-10-CM | POA: Diagnosis not present

## 2020-07-02 DIAGNOSIS — C300 Malignant neoplasm of nasal cavity: Secondary | ICD-10-CM | POA: Diagnosis not present

## 2020-07-02 NOTE — Patient Instructions (Addendum)
Health Maintenance Due  Topic Date Due  . TETANUS/TDAP - you need this if you get a cut/scrape 04/17/2020  . INFLUENZA VACCINE -double check with [facility]- im ok pushing this to October as long as they are ok with you getting it- you can call back for nurse visit if needed  05/25/2020    Blood pressure is very well controlled but potassium has been slightly high-I am going to reduce losartan to 25 mg. She will cut in half the 50 mg tablets until she runs out and then take up to 25 mg tablets.  Keep February 18th 2022 visit unless you need me sooner

## 2020-07-02 NOTE — Progress Notes (Signed)
Phone [phone] In person visit   Subjective:   Laura Salas is a 86 y.o. year old very pleasant female patient who presents for/with See problem oriented charting Chief Complaint  Patient presents with  . Medication Management  . Hypertension   This visit occurred during the SARS-CoV-2 public health emergency.  Safety protocols were in place, including screening questions prior to the visit, additional usage of staff PPE, and extensive cleaning of exam room while observing appropriate contact time as indicated for disinfecting solutions.   Past Medical History-  Patient Active Problem List   Diagnosis Date Noted  . Squamous cell carcinoma, nonkeratinizing 05/30/2020    Priority: High  . Diabetic retinopathy ([place]) 07/11/2015    Priority: High  . Diabetes Type II with retinopathy 05/15/2007    Priority: High  . PAC (premature atrial contraction) 07/11/2015    Priority: Medium  . CKD (chronic kidney disease), stage III 07/05/2014    Priority: Medium  . Insomnia 06/14/2008    Priority: Medium  . Hyperlipidemia associated with type 2 diabetes mellitus ([place]) 05/15/2007    Priority: Medium  . Hypertension associated with diabetes ([place]) 05/15/2007    Priority: Medium  . Osteoarthritis of both knees 02/17/2018    Priority: Low  . Osteoporosis 12/12/2009    Priority: Low  . INTENTION TREMOR 10/11/2008    Priority: Low  . History of depression 06/14/2008    Priority: Low  . ANEMIA, OTHER UNSPEC 01/03/2008    Priority: Low    Medications- reviewed and updated Current Outpatient Medications  Medication Sig Dispense Refill  . blood glucose meter kit and supplies KIT Dispense based on patient and insurance preference. Use to check blood sugar daily. One Touch glucometer E11.9 1 each 0  . diclofenac sodium (VOLTAREN) 1 % GEL Apply 2 g topically 4 (four) times daily. 100 g 5  . glucose blood test strip Use to test blood sugar daily and PRN 100 each 3  . Lancets (ONETOUCH  ULTRASOFT) lancets Use to test blood sugar daily and PRN 100 each 3  . losartan (COZAAR) 25 MG tablet Take 1 tablet (25 mg total) by mouth daily. 90 tablet 3  . metFORMIN (GLUCOPHAGE) 850 MG tablet TAKE 1 TABLET BY MOUTH TWICE A DAY 180 tablet 1  . simvastatin (ZOCOR) 40 MG tablet Take 1 tab at bedtime 90 tablet 1  . Vitamin D, Ergocalciferol, (DRISDOL) 1.25 MG (50000 UNIT) CAPS capsule Take 1 capsule (50,000 Units total) by mouth every 7 (seven) days. 13 capsule 1  . zolpidem (AMBIEN) 5 MG tablet Take 1 tablet (5 mg total) by mouth at bedtime as needed. for sleep 31 tablet 5   No current facility-administered medications for this visit.     Objective:  BP 118/76   Pulse 91   Temp 98 F (36.7 C) (Temporal)   Ht _0  (1.549 m)   Wt 126 lb 12.8 oz (57.5 kg)   SpO2 98%   BMI 23.96 kg/m  Gen: NAD, resting comfortably Left eye bulging and more closed than right eye CV: RRR no murmurs rubs or gallops Lungs: CTAB no crackles, wheeze, rhonchi Ext: no edema Skin: warm, dry     Assessment and Plan  1 Month F/U   #hypertension #CKD stage III  #hyperkalemia S: medication: Due to elevated microalbumin to creatinine ratio last visit amlodipine was stopped and patient was started on losartan 50 mg  For CKD stage III GFR is typically been in the 50s range. Reviewed creatinine/gfr  from [facility] on 07/02/20- creatinine less than 30% increase so reasonable to continue. Potassium has been 5.1 and 5.3   Home readings #s: 123/69 BP Readings from Last 3 Encounters:  07/04/20 118/76  05/30/20 140/62  11/23/19 (!) 150/70  A/P: Blood pressure is very well controlled but potassium has been slightly high-I am going to reduce losartan to 25 mg. She will cut in half the 50 mg tablets until she runs out and then take up to 25 mg tablets.  Thankfully her kidney function has been stable despite starting the losartan and this should be renal protective in the long run.  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 Diagnosed as nonkeatinizing squamous cell carcinoma.  MRI brain with invasion into anterior cranial fossa.   Deemed unresectable by ENT with [facility] 05/20/20 and not amenable to curative intent radiation.plan is oncology referral to consider palliative immunotherapy.   A/P:  She reports 2 immunotherapy sessions so far and another one scheduled on 29th. Then plan is another MRI scan at some point- may space to 6 weeks. She seems to be doing well -asked her to check with [facility] about influenza vaccination- I suspet we can go ahead and give this  Recommended follow up:   Future Appointments  Date Time Provider [place]  07/21/2020  9:00 AM [facility] CCM PHARMACIST [facility]  09/26/2020  9:15 AM Hayden Pedro, MD [facility] None  12/12/2020  9:40 AM Yong Channel Brayton Mars, MD [facility]    Lab/Order associations:   ICD-10-CM   1. Hypertension associated with diabetes ([place])  E11.59    I10   2. Stage 3 chronic kidney disease, unspecified whether stage 3a or 3b CKD  N18.30   3. Hyperkalemia  E87.5   4. Squamous cell carcinoma, nonkeratinizing  C44.92     Meds ordered this encounter  Medications  . losartan (COZAAR) 25 MG tablet    Sig: Take 1 tablet (25 mg total) by mouth daily.    Dispense:  90 tablet    Refill:  3   Return precautions advised.  Garret Reddish, MD

## 2020-07-04 ENCOUNTER — Ambulatory Visit (INDEPENDENT_AMBULATORY_CARE_PROVIDER_SITE_OTHER): Payer: Medicare HMO | Admitting: Family Medicine

## 2020-07-04 ENCOUNTER — Other Ambulatory Visit: Payer: Self-pay

## 2020-07-04 ENCOUNTER — Encounter: Payer: Self-pay | Admitting: Family Medicine

## 2020-07-04 VITALS — BP 118/76 | HR 91 | Temp 98.0°F | Ht 61.0 in | Wt 126.8 lb

## 2020-07-04 DIAGNOSIS — E875 Hyperkalemia: Secondary | ICD-10-CM | POA: Diagnosis not present

## 2020-07-04 DIAGNOSIS — C4492 Squamous cell carcinoma of skin, unspecified: Secondary | ICD-10-CM | POA: Diagnosis not present

## 2020-07-04 DIAGNOSIS — I10 Essential (primary) hypertension: Secondary | ICD-10-CM | POA: Diagnosis not present

## 2020-07-04 DIAGNOSIS — I152 Hypertension secondary to endocrine disorders: Secondary | ICD-10-CM

## 2020-07-04 DIAGNOSIS — N183 Chronic kidney disease, stage 3 unspecified: Secondary | ICD-10-CM

## 2020-07-04 DIAGNOSIS — E1159 Type 2 diabetes mellitus with other circulatory complications: Secondary | ICD-10-CM | POA: Diagnosis not present

## 2020-07-04 MED ORDER — LOSARTAN POTASSIUM 25 MG PO TABS
25.0000 mg | ORAL_TABLET | Freq: Every day | ORAL | 3 refills | Status: DC
Start: 2020-07-04 — End: 2020-12-12

## 2020-07-07 ENCOUNTER — Other Ambulatory Visit: Payer: Self-pay | Admitting: Family Medicine

## 2020-07-07 DIAGNOSIS — R69 Illness, unspecified: Secondary | ICD-10-CM | POA: Diagnosis not present

## 2020-07-08 DIAGNOSIS — L84 Corns and callosities: Secondary | ICD-10-CM | POA: Diagnosis not present

## 2020-07-08 DIAGNOSIS — M79674 Pain in right toe(s): Secondary | ICD-10-CM | POA: Diagnosis not present

## 2020-07-08 DIAGNOSIS — M71571 Other bursitis, not elsewhere classified, right ankle and foot: Secondary | ICD-10-CM | POA: Diagnosis not present

## 2020-07-10 ENCOUNTER — Ambulatory Visit (INDEPENDENT_AMBULATORY_CARE_PROVIDER_SITE_OTHER): Payer: Medicare HMO

## 2020-07-10 ENCOUNTER — Other Ambulatory Visit: Payer: Self-pay

## 2020-07-10 DIAGNOSIS — Z23 Encounter for immunization: Secondary | ICD-10-CM | POA: Diagnosis not present

## 2020-07-10 DIAGNOSIS — R69 Illness, unspecified: Secondary | ICD-10-CM | POA: Diagnosis not present

## 2020-07-16 ENCOUNTER — Other Ambulatory Visit: Payer: Self-pay | Admitting: *Deleted

## 2020-07-16 ENCOUNTER — Telehealth: Payer: Self-pay

## 2020-07-16 DIAGNOSIS — E1159 Type 2 diabetes mellitus with other circulatory complications: Secondary | ICD-10-CM

## 2020-07-16 DIAGNOSIS — E11319 Type 2 diabetes mellitus with unspecified diabetic retinopathy without macular edema: Secondary | ICD-10-CM

## 2020-07-16 DIAGNOSIS — E875 Hyperkalemia: Secondary | ICD-10-CM

## 2020-07-16 DIAGNOSIS — I152 Hypertension secondary to endocrine disorders: Secondary | ICD-10-CM

## 2020-07-16 NOTE — Progress Notes (Signed)
Chronic Care Management Pharmacy Assistant   Name: Laura Salas  MRN: [id number] DOB: December 06, 1930  Reason for Encounter: Initial Visit  Patient Questions:  1.  Have you seen any other providers since your last visit? Yes, Amlodipine was increased by Garret Reddish, MD  2.  Any changes in your medicines or health? Yes, Was diagnosed with cancer 4 months ago   Laura Salas,  86 y.o. , female presents for their Initial CCM visit with the clinical pharmacist via telephone.  PCP : Marin Olp, MD  Allergies:   Allergies  Allergen Reactions  . Alendronate Sodium     REACTION: upset stomach, joint and muscle pain  . Codeine Sulfate     REACTION: dizziness, "put her to bed"  . Ibandronate Sodium     REACTION: upset stomach, joint and muscle pain    Medications: Outpatient Encounter Medications as of 07/16/2020  Medication Sig  . blood glucose meter kit and supplies KIT Dispense based on patient and insurance preference. Use to check blood sugar daily. One Touch glucometer E11.9  . diclofenac sodium (VOLTAREN) 1 % GEL Apply 2 g topically 4 (four) times daily.  Marland Kitchen glucose blood test strip Use to test blood sugar daily and PRN  . Lancets (ONETOUCH ULTRASOFT) lancets Use to test blood sugar daily and PRN  . losartan (COZAAR) 25 MG tablet Take 1 tablet (25 mg total) by mouth daily.  . metFORMIN (GLUCOPHAGE) 850 MG tablet TAKE 1 TABLET BY MOUTH TWICE A DAY  . simvastatin (ZOCOR) 40 MG tablet TAKE 1 TABLET BY MOUTH EVERYDAY AT BEDTIME  . Vitamin D, Ergocalciferol, (DRISDOL) 1.25 MG (50000 UNIT) CAPS capsule Take 1 capsule (50,000 Units total) by mouth every 7 (seven) days.  Marland Kitchen zolpidem (AMBIEN) 5 MG tablet Take 1 tablet (5 mg total) by mouth at bedtime as needed. for sleep   No facility-administered encounter medications on file as of 07/16/2020.    Current Diagnosis: Patient Active Problem List   Diagnosis Date Noted  . Squamous cell carcinoma, nonkeratinizing 05/30/2020  .  Osteoarthritis of both knees 02/17/2018  . Diabetic retinopathy ([place]) 07/11/2015  . PAC (premature atrial contraction) 07/11/2015  . CKD (chronic kidney disease), stage III 07/05/2014  . Osteoporosis 12/12/2009  . INTENTION TREMOR 10/11/2008  . History of depression 06/14/2008  . Insomnia 06/14/2008  . ANEMIA, OTHER UNSPEC 01/03/2008  . Diabetes Type II with retinopathy 05/15/2007  . Hyperlipidemia associated with type 2 diabetes mellitus ([place]) 05/15/2007  . Hypertension associated with diabetes ([place]) 05/15/2007     Have you seen any other providers since your last visit? **no Any changes in your medications or health? yes Any side effects from any medications? no Do you have an symptoms or problems not managed by your medications? no Any concerns about your health right now? yes Has your provider asked that you check blood pressure, blood sugar, or follow special diet at home? yes Do you get any type of exercise on a regular basis? yes Can you think of a goal you would like to reach for your health? Would like to go into remission with her cancer .  Do you have any problems getting your medications? no Is there anything that you would like to discuss during the appointment? Patient states not that she can think of .    Please bring medications and supplements to appointment   Georgiana Shore ,[place] Pharmacist Assistant [phone]        Follow-Up:  Pharmacist  Review

## 2020-07-21 ENCOUNTER — Ambulatory Visit: Payer: Medicare HMO

## 2020-07-21 DIAGNOSIS — E11319 Type 2 diabetes mellitus with unspecified diabetic retinopathy without macular edema: Secondary | ICD-10-CM

## 2020-07-21 DIAGNOSIS — E1169 Type 2 diabetes mellitus with other specified complication: Secondary | ICD-10-CM

## 2020-07-21 DIAGNOSIS — I152 Hypertension secondary to endocrine disorders: Secondary | ICD-10-CM

## 2020-07-21 NOTE — Patient Instructions (Signed)
Please review care plan below and call me at [phone] (direct line) with any questions!  Thank you, [name]., Clinical Pharmacist  Goals Addressed            This Visit's Progress   . PharmD Care Plan       CARE PLAN ENTRY (see longitudinal plan of care for additional care plan information)  Current Barriers:  . Chronic Disease Management support, education, and care coordination needs related to Hypertension, Hyperlipidemia, and Diabetes   Hypertension BP Readings from Last 3 Encounters:  07/04/20 118/76  05/30/20 140/62  11/23/19 (!) 150/70   . Pharmacist Clinical Goal(s): o Over the next 180 days, patient will work with PharmD and providers to maintain BP goal <130/80 . Current regimen:  o Losartan 25 mg once daily (half of 50 mg tablet daily) . Interventions: o Diet/exercise recommendations . Patient self care activities - Over the next 180 days, patient will: o Check BP once every 1-2 weeks, document, and provide at future appointments o Ensure daily salt intake < 2300 mg/day  Hyperlipidemia Lab Results  Component Value Date/Time   LDLCALC 56 05/30/2020 10:02 AM   LDLDIRECT 61.0 01/03/2015 09:22 AM   . Pharmacist Clinical Goal(s): o Over the next 180 days, patient will work with PharmD and providers to maintain LDL goal < 70 . Current regimen:  o Simvastatin 40 mg once daily . Interventions: o Diet/exercise recommendations  . Patient self care activities - Over the next 180 days, patient will: o Continue current management  Diabetes Lab Results  Component Value Date/Time   HGBA1C 6.8 (H) 05/30/2020 10:02 AM   HGBA1C 6.7 (H) 11/23/2019 09:56 AM   . Pharmacist Clinical Goal(s): o Over the next 180 days, patient will work with PharmD and providers to maintain A1c goal <7% . Current regimen:  o Metformin 850 mg twice daily . Interventions: o Diet/exercise recommendations . Patient self care activities - Over the next 180 days, patient will: o Check  blood sugar as directed, document, and provide at future appointments o Contact provider with any episodes of hypoglycemia  Medication management . Pharmacist Clinical Goal(s): o Over the next 180 days, patient will work with PharmD and providers to maintain optimal medication adherence . Current pharmacy: [facility] . Interventions o Comprehensive medication review performed. o Continue current medication management strategy . Patient self care activities - Over the next 180 days, patient will: o Take medications as prescribed o Report any questions or concerns to PharmD and/or provider(s) Initial goal documentation.      [name] was given information about Chronic Care Management services today including:  1. CCM service includes personalized support from designated clinical staff supervised by her physician, including individualized plan of care and coordination with other care providers 2. 24/7 contact phone numbers for assistance for urgent and routine care needs. 3. Standard insurance, coinsurance, copays and deductibles apply for chronic care management only during months in which we provide at least 20 minutes of these services. Most insurances cover these services at 100%, however patients may be responsible for any copay, coinsurance and/or deductible if applicable. This service may help you avoid the need for more expensive face-to-face services. 4. Only one practitioner may furnish and bill the service in a calendar month. 5. The patient may stop CCM services at any time (effective at the end of the month) by phone call to the office staff.  Patient agreed to services and verbal consent obtained.   The patient verbalized understanding  of instructions provided today and agreed to receive a mailed copy of patient instruction and/or educational materials. Telephone follow up appointment with pharmacy team member scheduled for: See next appointment with "Care Management Staff" under  "What's Next" below.   Laura Salas, Pharm.D., BCGP Clinical Pharmacist [facility] [phone]  High Cholesterol  High cholesterol is a condition in which the blood has high levels of a white, waxy, fat-like substance (cholesterol). The human body needs small amounts of cholesterol. The liver makes all the cholesterol that the body needs. Extra (excess) cholesterol comes from the food that we eat. Cholesterol is carried from the liver by the blood through the blood vessels. If you have high cholesterol, deposits (plaques) may build up on the walls of your blood vessels (arteries). Plaques make the arteries narrower and stiffer. Cholesterol plaques increase your risk for heart attack and stroke. Work with your health care provider to keep your cholesterol levels in a healthy range. What increases the risk? This condition is more likely to develop in people who:  Eat foods that are high in animal fat (saturated fat) or cholesterol.  Are overweight.  Are not getting enough exercise.  Have a family history of high cholesterol. What are the signs or symptoms? There are no symptoms of this condition. How is this diagnosed? This condition may be diagnosed from the results of a blood test.  If you are older than age 61, your health care provider may check your cholesterol every 4-6 years.  You may be checked more often if you already have high cholesterol or other risk factors for heart disease. The blood test for cholesterol measures:  "Bad" cholesterol (LDL cholesterol). This is the main type of cholesterol that causes heart disease. The desired level for LDL is less than 100.  "Good" cholesterol (HDL cholesterol). This type helps to protect against heart disease by cleaning the arteries and carrying the LDL away. The desired level for HDL is 60 or higher.  Triglycerides. These are fats that the body can store or burn for energy. The desired number for triglycerides is lower  than 150.  Total cholesterol. This is a measure of the total amount of cholesterol in your blood, including LDL cholesterol, HDL cholesterol, and triglycerides. A healthy number is less than 200. How is this treated? This condition is treated with diet changes, lifestyle changes, and medicines. Diet changes  This may include eating more whole grains, fruits, vegetables, nuts, and fish.  This may also include cutting back on red meat and foods that have a lot of added sugar. Lifestyle changes  Changes may include getting at least 40 minutes of aerobic exercise 3 times a week. Aerobic exercises include walking, biking, and swimming. Aerobic exercise along with a healthy diet can help you maintain a healthy weight.  Changes may also include quitting smoking. Medicines  Medicines are usually given if diet and lifestyle changes have failed to reduce your cholesterol to healthy levels.  Your health care provider may prescribe a statin medicine. Statin medicines have been shown to reduce cholesterol, which can reduce the risk of heart disease. Follow these instructions at home: Eating and drinking If told by your health care provider:  Eat chicken (without skin), fish, veal, shellfish, ground [place] breast, and round or loin cuts of red meat.  Do not eat fried foods or fatty meats, such as hot dogs and salami.  Eat plenty of fruits, such as apples.  Eat plenty of vegetables, such as broccoli,  potatoes, and carrots.  Eat beans, peas, and lentils.  Eat grains such as barley, rice, couscous, and bulgur wheat.  Eat pasta without cream sauces.  Use skim or nonfat milk, and eat low-fat or nonfat yogurt and cheeses.  Do not eat or drink whole milk, cream, ice cream, egg yolks, or hard cheeses.  Do not eat stick margarine or tub margarines that contain trans fats (also called partially hydrogenated oils).  Do not eat saturated tropical oils, such as coconut oil and palm oil.  Do not  eat cakes, cookies, crackers, or other baked goods that contain trans fats.  General instructions  Exercise as directed by your health care provider. Increase your activity level with activities such as gardening, walking, and taking the stairs.  Take over-the-counter and prescription medicines only as told by your health care provider.  Do not use any products that contain nicotine or tobacco, such as cigarettes and e-cigarettes. If you need help quitting, ask your health care provider.  Keep all follow-up visits as told by your health care provider. This is important. Contact a health care provider if:  You are struggling to maintain a healthy diet or weight.  You need help to start on an exercise program.  You need help to stop smoking. Get help right away if:  You have chest pain.  You have trouble breathing. This information is not intended to replace advice given to you by your health care provider. Make sure you discuss any questions you have with your health care provider. Document Revised: 10/14/2017 Document Reviewed: 04/10/2016 Elsevier Patient Education  [place].

## 2020-07-21 NOTE — Progress Notes (Signed)
 Chronic Care Management Pharmacy  Name: Xinyi W Lerman  MRN: [id number] DOB: 01/07/1931  Chief Complaint/ HPI  Trinh W Pund,  86 y.o., female presents for their Initial CCM visit with the clinical pharmacist via telephone due to COVID-19 Pandemic.  PCP : Hunter, Stephen O, MD  Chronic conditions include:  Encounter Diagnoses  Name Primary?  . Hypertension associated with diabetes (HCC) Yes  . Type 2 diabetes mellitus with retinopathy, without long-term current use of insulin, macular edema presence unspecified, unspecified laterality, unspecified retinopathy severity (HCC)   . Hyperlipidemia associated with type 2 diabetes mellitus (HCC)     Office Visits:  07/04/2020 (PCP): 1 month f/u, elevated microalbumin to creatinine - had stopped amlodipine, started losartan 50 mg once daily; cut losartan 50 mg tablet in half to 25 mg once daily. Creatinine inc less than 30%. Potassium slightly high    Patient Active Problem List   Diagnosis Date Noted  . Squamous cell carcinoma, nonkeratinizing 05/30/2020  . Osteoarthritis of both knees 02/17/2018  . Diabetic retinopathy (HCC) 07/11/2015  . PAC (premature atrial contraction) 07/11/2015  . CKD (chronic kidney disease), stage III 07/05/2014  . Osteoporosis 12/12/2009  . INTENTION TREMOR 10/11/2008  . History of depression 06/14/2008  . Insomnia 06/14/2008  . ANEMIA, OTHER UNSPEC 01/03/2008  . Diabetes Type II with retinopathy 05/15/2007  . Hyperlipidemia associated with type 2 diabetes mellitus (HCC) 05/15/2007  . Hypertension associated with diabetes (HCC) 05/15/2007   Past Surgical History:  Procedure Laterality Date  . CARPAL TUNNEL RELEASE     left  . CATARACT EXTRACTION    . CESAREAN SECTION     Family History  Problem Relation Age of Onset  . Cancer Mother        colon  . Heart attack Father   . Heart attack Brother   . Cancer Brother        stomach  . Other Sister        died at 95   Allergies  Allergen  Reactions  . Alendronate Sodium     REACTION: upset stomach, joint and muscle pain  . Codeine Sulfate     REACTION: dizziness, "put her to bed"  . Ibandronate Sodium     REACTION: upset stomach, joint and muscle pain   Outpatient Encounter Medications as of 07/21/2020  Medication Sig  . blood glucose meter kit and supplies KIT Dispense based on patient and insurance preference. Use to check blood sugar daily. One Touch glucometer E11.9  . diclofenac sodium (VOLTAREN) 1 % GEL Apply 2 g topically 4 (four) times daily.  . glucose blood test strip Use to test blood sugar daily and PRN  . Lancets (ONETOUCH ULTRASOFT) lancets Use to test blood sugar daily and PRN  . losartan (COZAAR) 25 MG tablet Take 1 tablet (25 mg total) by mouth daily.  . metFORMIN (GLUCOPHAGE) 850 MG tablet TAKE 1 TABLET BY MOUTH TWICE A DAY  . simvastatin (ZOCOR) 40 MG tablet TAKE 1 TABLET BY MOUTH EVERYDAY AT BEDTIME  . Vitamin D, Ergocalciferol, (DRISDOL) 1.25 MG (50000 UNIT) CAPS capsule Take 1 capsule (50,000 Units total) by mouth every 7 (seven) days.  . zolpidem (AMBIEN) 5 MG tablet Take 1 tablet (5 mg total) by mouth at bedtime as needed. for sleep   No facility-administered encounter medications on file as of 07/21/2020.   Patient Care Team    Relationship Specialty Notifications Start End  Hunter, Stephen O, MD PCP - General Family Medicine  07/05/14      Comment: Merged  Matthews, John D, MD Consulting Physician Ophthalmology  07/28/18   Norris, Steve, MD Consulting Physician Orthopedic Surgery  07/28/18   Haverstock, Christina L, MD Referring Physician Dermatology  07/28/18   Ajlouny, Martha, DPM Consulting Physician Podiatry  07/28/18   , , RPH Pharmacist Pharmacist  06/02/20    Comment: [phone]   Current Diagnosis/Assessment: Goals Addressed            This Visit's Progress   . PharmD Care Plan       CARE PLAN ENTRY (see longitudinal plan of care for additional care plan  information)  Current Barriers:  . Chronic Disease Management support, education, and care coordination needs related to Hypertension, Hyperlipidemia, and Diabetes   Hypertension BP Readings from Last 3 Encounters:  07/04/20 118/76  05/30/20 140/62  11/23/19 (!) 150/70   . Pharmacist Clinical Goal(s): o Over the next 180 days, patient will work with PharmD and providers to maintain BP goal <130/80 . Current regimen:  o Losartan 25 mg once daily (half of 50 mg tablet daily) . Interventions: o Diet/exercise recommendations . Patient self care activities - Over the next 180 days, patient will: o Check BP once every 1-2 weeks, document, and provide at future appointments o Ensure daily salt intake < 2300 mg/day  Hyperlipidemia Lab Results  Component Value Date/Time   LDLCALC 56 05/30/2020 10:02 AM   LDLDIRECT 61.0 01/03/2015 09:22 AM   . Pharmacist Clinical Goal(s): o Over the next 180 days, patient will work with PharmD and providers to maintain LDL goal < 70 . Current regimen:  o Simvastatin 40 mg once daily . Interventions: o Diet/exercise recommendations  . Patient self care activities - Over the next 180 days, patient will: o Continue current management  Diabetes Lab Results  Component Value Date/Time   HGBA1C 6.8 (H) 05/30/2020 10:02 AM   HGBA1C 6.7 (H) 11/23/2019 09:56 AM   . Pharmacist Clinical Goal(s): o Over the next 180 days, patient will work with PharmD and providers to maintain A1c goal <7% . Current regimen:  o Metformin 850 mg twice daily . Interventions: o Diet/exercise recommendations . Patient self care activities - Over the next 180 days, patient will: o Check blood sugar as directed, document, and provide at future appointments o Contact provider with any episodes of hypoglycemia  Medication management . Pharmacist Clinical Goal(s): o Over the next 180 days, patient will work with PharmD and providers to maintain optimal medication  adherence . [facility] . Interventions o Comprehensive medication review performed. o Continue current medication management strategy . Patient self care activities - Over the next 180 days, patient will: o Take medications as prescribed o Report any questions or concerns to PharmD and/or provider(s) Initial goal documentation.      Hypertension   BP goal <130/80  BP Readings from Last 3 Encounters:  07/04/20 118/76  05/30/20 140/62  11/23/19 (!) 150/70   Lab Results  Component Value Date   K 4.6 05/30/2020   K 5.1 No hemolysis seen 11/23/2019   K 5.1 05/25/2019   Patient checks BP at home 1-2x per week.  Patient home BP readings are ranging: 119/68.  Previously on amlodipine 5 mg once daily. Good control on losartan 50 mg, elevated K previously.  Patient is currently at goal on the following medications:  . Losartan 25 mg once daily  We discussed diet and exercise extensively. Just watches what she eats, fruits, vegetables. Occasionally walks, active around house.      Plan  Continue current medications and control with diet and exercise.   Diabetes   A1c goal < 7%  Lab Results  Component Value Date/Time   HGBA1C 6.8 (H) 05/30/2020 10:02 AM   HGBA1C 6.7 (H) 11/23/2019 09:56 AM   MICROALBUR 4.3 05/30/2020 01:44 PM   MICROALBUR 4.3 (H) 05/18/2019 11:59 AM   MICROALBUR 30 01/14/2017 10:41 AM    Checking BG: Daily. Recent FBG readings low 100s.    Previous medications: Patient is currently controlled on the following medications:  Marland Kitchen Metformin 850 mg twice daily  We discussed: diet and exercise extensively.  Plan  Continue current medications and control with diet and exercise.  Hyperlipidemia   LDL goal < 70  Lipid Panel     Component Value Date/Time   CHOL 134 05/30/2020 1002   TRIG 78 05/30/2020 1002   TRIG 75 09/30/2006 1039   HDL 62 05/30/2020 1002   LDLCALC 56 05/30/2020 1002   LDLDIRECT 61.0 01/03/2015 0922    Hepatic Function  Latest Ref Rng & Units 05/30/2020 11/23/2019 05/18/2019  Total Protein 6.1 - 8.1 g/dL 6.9 7.4 7.1  Albumin 3.5 - 5.2 g/dL - 4.4 4.6  AST 10 - 35 U/L _0 ALT 6 - 29 U/L _1 Alk Phosphatase 39 - 117 U/L - 73 72  Total Bilirubin 0.2 - 1.2 mg/dL 0.5 0.6 0.7  Bilirubin, Direct 0.0 - 0.3 mg/dL - - -    The ASCVD Risk score Mikey Bussing DC Jr., et al., 2013) failed to calculate for the following reasons:   The 2013 ASCVD risk score is only valid for ages 50 to 10   Some OA pain, no problems with muscle aches/pain. Patient has failed these meds in past: n/a Patient is currently controlled on the following medications:  . Simvastatin 40 mg once daily   We discussed:  diet and exercise extensively.  Plan  Continue current medications and control with diet and exercise.  Osteoporosis   Vit D, 25-Hydroxy  Date Value Ref Range Status  05/30/2020 13 (L) 30 - 100 ng/mL Final    Comment:    Vitamin D Status         25-OH Vitamin D: . Deficiency:                    <20 ng/mL Insufficiency:             20 - 29 ng/mL Optimal:                 > or = 30 ng/mL . For 25-OH Vitamin D testing on patients on  D2-supplementation and patients for whom quantitation  of D2 and D3 fractions is required, the QuestAssureD(TM) 25-OH VIT D, (D2,D3), LC/MS/MS is recommended: order  code [id number] (patients >39yr). See Note 1 . Note 1 . For additional information, please refer to  http://education.QuestDiagnostics.com/faq/FAQ199  (This link is being provided for informational/ educational purposes only.)     Patient has failed these meds in past: bisphosphonates.  Patient is currently taking the following medications:   Vitamin d2 50000 units every 7 days startin 05/31/2020.   We discussed:  Recommend [phone] units of vitamin D daily. Recommend 1200 mg of calcium daily from dietary and supplemental sources. Recommend weight-bearing and muscle strengthening exercises for building and maintaining bone  density..  Plan  Continue current medications.  Insomnia    Feels well rested, no falls, rarely getting up throughout night.Patient is currently controlled  on the following medications:  Marland Kitchen Zolpidem 5 mg once daily  We discussed:  Counseled on medication, no current side effects.  Plan  Continue current medications.  Vaccines   Immunization History  Administered Date(s) Administered  . Fluad Quad(high Dose 65+) 08/17/2019, 07/10/2020  . H1N1 10/11/2008  . Influenza Split 08/06/2011, 08/13/2011, 08/11/2012  . Influenza Whole 08/25/2007, 08/09/2008, 08/01/2009, 07/24/2010  . Influenza, High Dose Seasonal PF 07/16/2016, 07/15/2017, 07/28/2018  . Influenza,inj,Quad PF,6+ Mos 06/29/2013, 07/05/2014, 07/11/2015  . PFIZER SARS-COV-2 Vaccination 12/15/2019, 01/07/2020  . Pneumococcal Conjugate-13 01/03/2015  . Pneumococcal Polysaccharide-23 11/10/2007  . Td 04/17/2010  . Zoster 04/17/2010  . Zoster Recombinat (Shingrix) 04/11/2020   Reviewed and discussed patient's vaccination history. Due for shingrix 2nd dose, covid vaccine booster, tdap.  Plan  Recommended patient receive 2nd dose of shingrix vaccine and tdap and covid booster vaccines.   Medication Management / Care Coordination   [facility]  [id number] [place] [facility] [address] [place]Phone: [phone]: [address] [place]Phone: [phone]: [phone]  Denies any issues with current medication management.   Plan  Continue current medication management strategy. ___________________________ SDOH (Social Determinants of Health) assessments performed: Yes.  Future Appointments  Date Time Provider [place] 09/26/2020  9:15 AM MHayden Pedro MD [facility] None   12/12/2020  9:40 AM HYong ChannelSBrayton Mars MD [facility]   Visit follow-up:  . RPH follow-up: 6 month telephone visit.  JMadelin Rear Pharm.D., BCGP Clinical Pharmacist [facility] ([phone]

## 2020-07-22 DIAGNOSIS — R69 Illness, unspecified: Secondary | ICD-10-CM | POA: Diagnosis not present

## 2020-07-23 DIAGNOSIS — R270 Ataxia, unspecified: Secondary | ICD-10-CM | POA: Diagnosis not present

## 2020-07-23 DIAGNOSIS — Z5112 Encounter for antineoplastic immunotherapy: Secondary | ICD-10-CM | POA: Diagnosis not present

## 2020-07-23 DIAGNOSIS — M25561 Pain in right knee: Secondary | ICD-10-CM | POA: Diagnosis not present

## 2020-07-23 DIAGNOSIS — R519 Headache, unspecified: Secondary | ICD-10-CM | POA: Diagnosis not present

## 2020-07-23 DIAGNOSIS — H532 Diplopia: Secondary | ICD-10-CM | POA: Diagnosis not present

## 2020-07-23 DIAGNOSIS — C300 Malignant neoplasm of nasal cavity: Secondary | ICD-10-CM | POA: Diagnosis not present

## 2020-07-23 DIAGNOSIS — E11319 Type 2 diabetes mellitus with unspecified diabetic retinopathy without macular edema: Secondary | ICD-10-CM | POA: Diagnosis not present

## 2020-07-23 DIAGNOSIS — Z5111 Encounter for antineoplastic chemotherapy: Secondary | ICD-10-CM | POA: Diagnosis not present

## 2020-07-23 DIAGNOSIS — G893 Neoplasm related pain (acute) (chronic): Secondary | ICD-10-CM | POA: Diagnosis not present

## 2020-08-14 DIAGNOSIS — Z5111 Encounter for antineoplastic chemotherapy: Secondary | ICD-10-CM | POA: Diagnosis not present

## 2020-08-14 DIAGNOSIS — C319 Malignant neoplasm of accessory sinus, unspecified: Secondary | ICD-10-CM | POA: Diagnosis not present

## 2020-08-14 DIAGNOSIS — C300 Malignant neoplasm of nasal cavity: Secondary | ICD-10-CM | POA: Diagnosis not present

## 2020-08-14 DIAGNOSIS — R220 Localized swelling, mass and lump, head: Secondary | ICD-10-CM | POA: Diagnosis not present

## 2020-08-14 DIAGNOSIS — G939 Disorder of brain, unspecified: Secondary | ICD-10-CM | POA: Diagnosis not present

## 2020-08-14 DIAGNOSIS — Z5112 Encounter for antineoplastic immunotherapy: Secondary | ICD-10-CM | POA: Diagnosis not present

## 2020-08-22 DIAGNOSIS — M7751 Other enthesopathy of right foot: Secondary | ICD-10-CM | POA: Diagnosis not present

## 2020-08-22 DIAGNOSIS — M79674 Pain in right toe(s): Secondary | ICD-10-CM | POA: Diagnosis not present

## 2020-08-29 ENCOUNTER — Other Ambulatory Visit: Payer: Self-pay | Admitting: Family Medicine

## 2020-08-29 DIAGNOSIS — M7751 Other enthesopathy of right foot: Secondary | ICD-10-CM | POA: Diagnosis not present

## 2020-08-29 DIAGNOSIS — R69 Illness, unspecified: Secondary | ICD-10-CM | POA: Diagnosis not present

## 2020-09-03 DIAGNOSIS — R69 Illness, unspecified: Secondary | ICD-10-CM | POA: Diagnosis not present

## 2020-09-09 ENCOUNTER — Telehealth: Payer: Self-pay

## 2020-09-09 NOTE — Progress Notes (Signed)
Chronic Care Management Pharmacy Assistant   Name: ARZELLA REHMANN  MRN: [id number] DOB: 1931-08-09  Reason for Encounter: Disease State  PCP : Marin Olp, MD  Allergies:   Allergies  Allergen Reactions  . Alendronate Sodium     REACTION: upset stomach, joint and muscle pain  . Codeine Sulfate     REACTION: dizziness, "put her to bed"  . Ibandronate Sodium     REACTION: upset stomach, joint and muscle pain    Medications: Outpatient Encounter Medications as of 09/09/2020  Medication Sig  . blood glucose meter kit and supplies KIT Dispense based on patient and insurance preference. Use to check blood sugar daily. One Touch glucometer E11.9  . diclofenac sodium (VOLTAREN) 1 % GEL Apply 2 g topically 4 (four) times daily.  Marland Kitchen glucose blood test strip Use to test blood sugar daily and PRN  . Lancets (ONETOUCH ULTRASOFT) lancets Use to test blood sugar daily and PRN  . losartan (COZAAR) 25 MG tablet Take 1 tablet (25 mg total) by mouth daily.  . metFORMIN (GLUCOPHAGE) 850 MG tablet TAKE 1 TABLET BY MOUTH TWICE A DAY  . simvastatin (ZOCOR) 40 MG tablet TAKE 1 TABLET BY MOUTH EVERYDAY AT BEDTIME  . Vitamin D, Ergocalciferol, (DRISDOL) 1.25 MG (50000 UNIT) CAPS capsule Take 1 capsule (50,000 Units total) by mouth every 7 (seven) days.  Marland Kitchen zolpidem (AMBIEN) 5 MG tablet Take 1 tablet (5 mg total) by mouth at bedtime as needed. for sleep   No facility-administered encounter medications on file as of 09/09/2020.    Current Diagnosis: Patient Active Problem List   Diagnosis Date Noted  . Squamous cell carcinoma, nonkeratinizing 05/30/2020  . Osteoarthritis of both knees 02/17/2018  . Diabetic retinopathy ([place]) 07/11/2015  . PAC (premature atrial contraction) 07/11/2015  . CKD (chronic kidney disease), stage III ([place]) 07/05/2014  . Osteoporosis 12/12/2009  . INTENTION TREMOR 10/11/2008  . History of depression 06/14/2008  . Insomnia 06/14/2008  . ANEMIA, OTHER UNSPEC  01/03/2008  . Diabetes Type II with retinopathy 05/15/2007  . Hyperlipidemia associated with type 2 diabetes mellitus ([place]) 05/15/2007  . Hypertension associated with diabetes ([place]) 05/15/2007     Have you had any problems recently with your health?         Patient stated she was diagnosed with cancer x3 months ago. Patient stated she has been going through  infusions with [facility].  Patient states she has an appointment in 6 weeks for an mri and also states she Is on chemo. Patient stated she is on an oral chemo, each treatment is 21 days.  Patient states she takes 2 chemo tablets in the morning after she eats then has to drink a large mass of water, Same at night with evening meal . Patient stated she is instructed to drinks half a gallon of water. Patient stated she has done treatment twice and has one more treatment. Patient stated she is having Balance problems and fatigue.  Have you had any problems with your pharmacy?      Patient stated when she goes to [facility] , every 3 weeks , she goes to their pharmacy before she leaves to pick up her medications.            What issues or side effects are you having with your medications?   Patient stated she is having side effects with the cancer treatments.Patient stated her biggest side effect is fatigue, stated she Is doing very well with treatment and  has no side effects with any other medications. Patient stated her Medications were discontinued for a moment when she started chemo.    What would you like me to pass along to Edison Nasuti Potts,CPP for them to help you with?                 Patient stated she is doing as well as she can  What can we do to take care of you better?            Patient stated she Spends a day at [facility] every 3 weeks and is  Doing pretty good and doing what ever [facility] recommends .   Georgiana Shore ,[place] Pharmacist Assistant [phone]    Follow-Up:  Pharmacist Review

## 2020-09-17 ENCOUNTER — Telehealth: Payer: Self-pay | Admitting: Family Medicine

## 2020-09-17 NOTE — Telephone Encounter (Signed)
Talked to pt today. Has a lot going on with new CA DX. wants call back in Feb 2022.   Due for Medicare Annual Wellness Visit (AWV) either virtually OR in office.   Last AWV 08/17/19; please schedule at anytime with [facility] Advisor at [facility].  This should be a 45 minute visit.

## 2020-09-24 DIAGNOSIS — R69 Illness, unspecified: Secondary | ICD-10-CM | POA: Diagnosis not present

## 2020-09-24 DIAGNOSIS — G893 Neoplasm related pain (acute) (chronic): Secondary | ICD-10-CM | POA: Diagnosis not present

## 2020-09-24 DIAGNOSIS — Z5112 Encounter for antineoplastic immunotherapy: Secondary | ICD-10-CM | POA: Diagnosis not present

## 2020-09-24 DIAGNOSIS — Z5111 Encounter for antineoplastic chemotherapy: Secondary | ICD-10-CM | POA: Diagnosis not present

## 2020-09-24 DIAGNOSIS — C300 Malignant neoplasm of nasal cavity: Secondary | ICD-10-CM | POA: Diagnosis not present

## 2020-09-26 ENCOUNTER — Encounter (INDEPENDENT_AMBULATORY_CARE_PROVIDER_SITE_OTHER): Payer: Medicare HMO | Admitting: Ophthalmology

## 2020-10-03 DIAGNOSIS — R69 Illness, unspecified: Secondary | ICD-10-CM | POA: Diagnosis not present

## 2020-10-09 DIAGNOSIS — R69 Illness, unspecified: Secondary | ICD-10-CM | POA: Diagnosis not present

## 2020-10-15 DIAGNOSIS — Z5112 Encounter for antineoplastic immunotherapy: Secondary | ICD-10-CM | POA: Diagnosis not present

## 2020-10-15 DIAGNOSIS — G893 Neoplasm related pain (acute) (chronic): Secondary | ICD-10-CM | POA: Diagnosis not present

## 2020-10-15 DIAGNOSIS — Z5111 Encounter for antineoplastic chemotherapy: Secondary | ICD-10-CM | POA: Diagnosis not present

## 2020-10-15 DIAGNOSIS — C300 Malignant neoplasm of nasal cavity: Secondary | ICD-10-CM | POA: Diagnosis not present

## 2020-10-15 DIAGNOSIS — R69 Illness, unspecified: Secondary | ICD-10-CM | POA: Diagnosis not present

## 2020-10-15 DIAGNOSIS — K1231 Oral mucositis (ulcerative) due to antineoplastic therapy: Secondary | ICD-10-CM | POA: Diagnosis not present

## 2020-11-07 DIAGNOSIS — G893 Neoplasm related pain (acute) (chronic): Secondary | ICD-10-CM | POA: Diagnosis not present

## 2020-11-07 DIAGNOSIS — C6962 Malignant neoplasm of left orbit: Secondary | ICD-10-CM | POA: Diagnosis not present

## 2020-11-07 DIAGNOSIS — Z5111 Encounter for antineoplastic chemotherapy: Secondary | ICD-10-CM | POA: Diagnosis not present

## 2020-11-07 DIAGNOSIS — C300 Malignant neoplasm of nasal cavity: Secondary | ICD-10-CM | POA: Diagnosis not present

## 2020-11-07 DIAGNOSIS — K1231 Oral mucositis (ulcerative) due to antineoplastic therapy: Secondary | ICD-10-CM | POA: Diagnosis not present

## 2020-11-07 DIAGNOSIS — Z5112 Encounter for antineoplastic immunotherapy: Secondary | ICD-10-CM | POA: Diagnosis not present

## 2020-11-07 DIAGNOSIS — H0589 Other disorders of orbit: Secondary | ICD-10-CM | POA: Diagnosis not present

## 2020-11-24 ENCOUNTER — Telehealth: Payer: Self-pay

## 2020-11-24 ENCOUNTER — Other Ambulatory Visit: Payer: Self-pay | Admitting: Family Medicine

## 2020-11-24 MED ORDER — ZOLPIDEM TARTRATE 5 MG PO TABS
5.0000 mg | ORAL_TABLET | Freq: Every evening | ORAL | 5 refills | Status: DC | PRN
Start: 2020-11-24 — End: 2021-03-29

## 2020-11-24 NOTE — Addendum Note (Signed)
Addended by: Marin Olp on: 11/24/2020 05:16 PM   Modules accepted: Orders

## 2020-11-24 NOTE — Telephone Encounter (Signed)
I refilled this 

## 2020-11-24 NOTE — Telephone Encounter (Signed)
..   LAST APPOINTMENT DATE: 07/04/20   NEXT APPOINTMENT DATE:@2 /18/2022  MEDICATION:zolpidem (AMBIEN) 5 MG tablet    [id number] [place], [place] [address] AT [address]

## 2020-11-27 ENCOUNTER — Telehealth: Payer: Self-pay

## 2020-11-27 NOTE — Chronic Care Management (AMB) (Signed)
    Chronic Care Management Pharmacy Assistant   Name: Laura Salas  MRN: [id number] DOB: [birth year]  Reason for Encounter: Disease State/ General Adherence Call  PCP : Marin Olp, MD  Allergies:   Allergies  Allergen Reactions  . Alendronate Sodium     REACTION: upset stomach, joint and muscle pain  . Codeine Sulfate     REACTION: dizziness, "put her to bed"  . Ibandronate Sodium     REACTION: upset stomach, joint and muscle pain    Medications: Outpatient Encounter Medications as of 11/27/2020  Medication Sig  . blood glucose meter kit and supplies KIT Dispense based on patient and insurance preference. Use to check blood sugar daily. One Touch glucometer E11.9  . diclofenac sodium (VOLTAREN) 1 % GEL Apply 2 g topically 4 (four) times daily.  Marland Kitchen glucose blood test strip Use to test blood sugar daily and PRN  . Lancets (ONETOUCH ULTRASOFT) lancets Use to test blood sugar daily and PRN  . losartan (COZAAR) 25 MG tablet Take 1 tablet (25 mg total) by mouth daily.  . metFORMIN (GLUCOPHAGE) 850 MG tablet TAKE 1 TABLET BY MOUTH TWICE A DAY  . simvastatin (ZOCOR) 40 MG tablet TAKE 1 TABLET BY MOUTH EVERYDAY AT BEDTIME  . Vitamin D, Ergocalciferol, (DRISDOL) 1.25 MG (50000 UNIT) CAPS capsule TAKE 1 CAPSULE (50,000 UNITS TOTAL) BY MOUTH EVERY 7 (SEVEN) DAYS.  Marland Kitchen zolpidem (AMBIEN) 5 MG tablet Take 1 tablet (5 mg total) by mouth at bedtime as needed. for sleep   No facility-administered encounter medications on file as of 11/27/2020.    Current Diagnosis: Patient Active Problem List   Diagnosis Date Noted  . Squamous cell carcinoma, nonkeratinizing 05/30/2020  . Osteoarthritis of both knees 02/17/2018  . Diabetic retinopathy ([place]) 07/11/2015  . PAC (premature atrial contraction) 07/11/2015  . CKD (chronic kidney disease), stage III ([place]) 07/05/2014  . Osteoporosis 12/12/2009  . INTENTION TREMOR 10/11/2008  . History of depression 06/14/2008  . Insomnia 06/14/2008  . ANEMIA,  OTHER UNSPEC 01/03/2008  . Diabetes Type II with retinopathy 05/15/2007  . Hyperlipidemia associated with type 2 diabetes mellitus ([place]) 05/15/2007  . Hypertension associated with diabetes ([place]) 05/15/2007    Have you seen any other providers since your last visit with Madelin Rear, Pharm.D., BCGP?  09/24/2020 [facility] [place], Anderson Malta, NP Carcinoma of nasal cavity, 10/15/2020 [facility] [place], NP Carcinoma of nasal cavity, 11/07/2020 [facility] [place], Sande Rives, MD Carcinoma of nasal cavity.  Have you had any problems recently with your health?  Have you had any problems with your pharmacy?  What issues or side effects are you having with your medications?  What would you like me to pass along to Madelin Rear, [place].D., BCGP for them to help you with?   What can we do to take care of you better?  **Attempted to reach the patient for her General Adherence Call. Several unsuccessful attempts have been made. Chart review performed before attempts were made**  April D Calhoun, [place] Pharmacist Assistant [phone]    Follow-Up:  Pharmacist Review

## 2020-11-28 ENCOUNTER — Telehealth: Payer: Self-pay

## 2020-11-28 NOTE — Telephone Encounter (Signed)
Called to do her PA for her Ambien 5MG  over the phone. (I accidentally made an error on her paperwork.)  Patients PA has been approved. The approval number is : [id number].

## 2020-12-03 DIAGNOSIS — C300 Malignant neoplasm of nasal cavity: Secondary | ICD-10-CM | POA: Diagnosis not present

## 2020-12-03 DIAGNOSIS — E039 Hypothyroidism, unspecified: Secondary | ICD-10-CM | POA: Diagnosis not present

## 2020-12-08 ENCOUNTER — Ambulatory Visit: Payer: Medicare HMO | Admitting: Family Medicine

## 2020-12-11 NOTE — Patient Instructions (Addendum)
A1c before you go point of care- as long as under 7.5- do not have to wait for me  Diabetes- with weight loss- lets reduce metformin to 850mg  once daily. If sugars get above 150 regularly in morning let me know.  But if stay below 150 for 3 days- can stop completely- keep an eye on this- would still prefer average under 150 if possible.   Stop simvastatin  Call me if you change your mind and want to do home health physical therapy  Recommended follow up: 91 days

## 2020-12-11 NOTE — Progress Notes (Signed)
Phone [phone] In person visit   Subjective:   Laura Salas is a 87 y.o. year old very pleasant female patient who presents for/with See problem oriented charting Chief Complaint  Patient presents with   Hyperlipidemia   Hypertension   This visit occurred during the SARS-CoV-2 public health emergency.  Safety protocols were in place, including screening questions prior to the visit, additional usage of staff PPE, and extensive cleaning of exam room while observing appropriate contact time as indicated for disinfecting solutions.   Past Medical History-  Patient Active Problem List   Diagnosis Date Noted   Squamous cell carcinoma, nonkeratinizing 05/30/2020    Priority: High   Diabetic retinopathy ([place]) 07/11/2015    Priority: High   Diabetes Type II with retinopathy 05/15/2007    Priority: High   PAC (premature atrial contraction) 07/11/2015    Priority: Medium   CKD (chronic kidney disease), stage III ([place]) 07/05/2014    Priority: Medium   Insomnia 06/14/2008    Priority: Medium   Hyperlipidemia associated with type 2 diabetes mellitus ([place]) 05/15/2007    Priority: Medium   Hypertension associated with diabetes ([place]) 05/15/2007    Priority: Medium   Osteoarthritis of both knees 02/17/2018    Priority: Low   Osteoporosis 12/12/2009    Priority: Low   INTENTION TREMOR 10/11/2008    Priority: Low   History of depression 06/14/2008    Priority: Low   ANEMIA, OTHER UNSPEC 01/03/2008    Priority: Low    Medications- reviewed and updated Current Outpatient Medications  Medication Sig Dispense Refill   cyanocobalamin 1000 MCG tablet Take by mouth.     dexamethasone (DECADRON) 1 MG tablet Take 1 mg by mouth once.     diclofenac sodium (VOLTAREN) 1 % GEL Apply 2 g topically 4 (four) times daily. 100 g 5   prochlorperazine (COMPAZINE) 10 MG tablet Take 10 mg by mouth every 6 (six) hours.     traMADol (ULTRAM) 50 MG tablet Take 50 mg by mouth every  6 (six) hours as needed.     Vitamin D, Ergocalciferol, (DRISDOL) 1.25 MG (50000 UNIT) CAPS capsule TAKE 1 CAPSULE (50,000 UNITS TOTAL) BY MOUTH EVERY 7 (SEVEN) DAYS. 13 capsule 1   zolpidem (AMBIEN) 5 MG tablet Take 1 tablet (5 mg total) by mouth at bedtime as needed. for sleep 31 tablet 5   capecitabine (XELODA) 500 MG tablet Take by mouth. (Patient not taking: Reported on 12/12/2020)     metFORMIN (GLUCOPHAGE) 850 MG tablet Take 1 tablet (850 mg total) by mouth daily with breakfast. 90 tablet 3   No current facility-administered medications for this visit.     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 Diagnosed as nonkeatinizing squamous cell carcinoma.  MRI brain with invasion into anterior cranial fossa.   Deemed unresectable by ENT with [facility] 05/20/20 and not amenable to curative intent radiation.  Patient currently on palliative treatment.  Tumor progressed on pembrolizumab.  She is currently on second line palliative chemotherapy-capeceitabine 5 treatments. She did lose 15 lbs with covid infection. Having fatigue, weakness, weight loss- patient's losartan was held with BP 100/60  Had 3 falls in January- using walker now and has not had any falls.  A/P: Patient with weight loss after COVID and likely in relation to cancer and treatments-see  discussion below we are going to try to minimize medications.  She was already taken off losartan.  We are to stop simvastatin.  We are going to try to get her off Metformin. -With her falls recommended home health PT but she declines for now-she will call us back if she changes her mind-she actually wants her strength to be a little better before this and perhaps reducing medicines will help -Continue close follow-up with [facility]-hoping new treatment is more helpful for her -No falls since being on her walker thankfully.  See insomnia discussion below.  Continue walker-referral would be under generalized weakness if needed  # Diabetes  S: Medication:metformin 850 mg twice daily. Also on decadron for balance per oncology.   Doing ensure intermittently  CBGs-  Blood sugars 108- 133 Lab Results  Component Value Date   HGBA1C 6.8 (H) 05/30/2020   A/P: Diabetes- with weight loss- lets reduce metformin to 850mg  once daily. If sugars get above 150 regularly in morning let me know.  But if stay below 150 for 3 days- can stop completely  #hyperlipidemia. LDL under 70 goal S: Medication:Simvastatin 40 mg  A/P:  With balance issues and palliative approach and instability- want to remove any barriers to her strength- will stop simvastatin.   #hypertension S: medication: losartan 25 mg (switched to this due to elevated microaclbumin/cr ratio but reduced from 50mg  due to slight hyperkalemia) Prior Amlodipine 5 mg Home readings #s: mainly 100s/50s even without losartan A/P: good control off meds- continue current meds   #Chronic kidney disease stage III S: GFR is typically in the 50s range -Patient knows to avoid NSAIDs- has used some ibuprofen  A/P:  Stable on last check with [facility] - mainly using tylenol-    #Insomnia- control on Ambien 5 mg. Has had falls but stabilized in February- from palliative perspective sleep very important and we opted to continue fo rnow.  May need to reconsider if has falls  with walker  #Vitamin D deficiency S: Medication: 50k units once a week Last vitamin D Lab Results  Component Value Date   VD25OH 13 (L) 05/30/2020  A/P: prefers to hold off on bloodwork- with how low she has been- I think its ok to continue 50k units  Recommended follow up: 91 days discussed so we can repeat A1c Future Appointments  Date Time Provider [place]  01/21/2021  1:00 PM [facility] CCM PHARMACIST [facility]    Lab/Order associations:   ICD-10-CM   1. Hypertension associated with diabetes ([place])  E11.59    I15.2   2. Hyperlipidemia associated with type 2 diabetes mellitus ([place])  E11.69    E78.5   3. Type 2 diabetes mellitus with retinopathy, without long-term current use of insulin, macular edema presence unspecified, unspecified laterality, unspecified retinopathy severity ([facility])  E11.319 POCT glycosylated hemoglobin (Hb A1C)  4. Stage 3 chronic kidney disease, unspecified whether stage 3a or 3b CKD ([place])  N18.30     Meds ordered this encounter  Medications   metFORMIN (GLUCOPHAGE) 850 MG tablet    Sig: Take 1 tablet (850 mg total) by mouth daily with breakfast.    Dispense:  90 tablet    Refill:  3   Return precautions advised.  Garret Reddish, MD

## 2020-12-12 ENCOUNTER — Encounter: Payer: Self-pay | Admitting: Family Medicine

## 2020-12-12 ENCOUNTER — Other Ambulatory Visit: Payer: Self-pay

## 2020-12-12 ENCOUNTER — Ambulatory Visit (INDEPENDENT_AMBULATORY_CARE_PROVIDER_SITE_OTHER): Payer: Medicare HMO | Admitting: Family Medicine

## 2020-12-12 VITALS — BP 118/66 | HR 86 | Temp 97.9°F | Ht 61.0 in | Wt 111.6 lb

## 2020-12-12 DIAGNOSIS — E785 Hyperlipidemia, unspecified: Secondary | ICD-10-CM

## 2020-12-12 DIAGNOSIS — E1159 Type 2 diabetes mellitus with other circulatory complications: Secondary | ICD-10-CM

## 2020-12-12 DIAGNOSIS — N183 Chronic kidney disease, stage 3 unspecified: Secondary | ICD-10-CM | POA: Diagnosis not present

## 2020-12-12 DIAGNOSIS — I152 Hypertension secondary to endocrine disorders: Secondary | ICD-10-CM | POA: Diagnosis not present

## 2020-12-12 DIAGNOSIS — E1169 Type 2 diabetes mellitus with other specified complication: Secondary | ICD-10-CM

## 2020-12-12 DIAGNOSIS — E11319 Type 2 diabetes mellitus with unspecified diabetic retinopathy without macular edema: Secondary | ICD-10-CM | POA: Diagnosis not present

## 2020-12-12 LAB — POCT GLYCOSYLATED HEMOGLOBIN (HGB A1C): Hemoglobin A1C: 6.4 % — AB (ref 4.0–5.6)

## 2020-12-12 MED ORDER — METFORMIN HCL 850 MG PO TABS: 850.0000 mg | ORAL_TABLET | Freq: Every day | ORAL | 3 refills | Status: DC

## 2020-12-19 DIAGNOSIS — L603 Nail dystrophy: Secondary | ICD-10-CM | POA: Diagnosis not present

## 2020-12-22 ENCOUNTER — Telehealth: Payer: Self-pay

## 2020-12-22 NOTE — Chronic Care Management (AMB) (Signed)
    Chronic Care Management Pharmacy Assistant   Name: Laura Salas  MRN: [id number] DOB: 1930/10/29  Reason for Encounter: Chart Review  PCP : Marin Olp, MD  Allergies:   Allergies  Allergen Reactions  . Alendronate Sodium     REACTION: upset stomach, joint and muscle pain  . Codeine Sulfate     REACTION: dizziness, "put her to bed"  . Ibandronate Sodium     REACTION: upset stomach, joint and muscle pain    Medications: Outpatient Encounter Medications as of 12/22/2020  Medication Sig  . capecitabine (XELODA) 500 MG tablet Take by mouth. (Patient not taking: Reported on 12/12/2020)  . cyanocobalamin 1000 MCG tablet Take by mouth.  . dexamethasone (DECADRON) 1 MG tablet Take 1 mg by mouth once.  . diclofenac sodium (VOLTAREN) 1 % GEL Apply 2 g topically 4 (four) times daily.  . metFORMIN (GLUCOPHAGE) 850 MG tablet Take 1 tablet (850 mg total) by mouth daily with breakfast.  . prochlorperazine (COMPAZINE) 10 MG tablet Take 10 mg by mouth every 6 (six) hours.  . traMADol (ULTRAM) 50 MG tablet Take 50 mg by mouth every 6 (six) hours as needed.  . Vitamin D, Ergocalciferol, (DRISDOL) 1.25 MG (50000 UNIT) CAPS capsule TAKE 1 CAPSULE (50,000 UNITS TOTAL) BY MOUTH EVERY 7 (SEVEN) DAYS.  Marland Kitchen zolpidem (AMBIEN) 5 MG tablet Take 1 tablet (5 mg total) by mouth at bedtime as needed. for sleep   No facility-administered encounter medications on file as of 12/22/2020.    Current Diagnosis: Patient Active Problem List   Diagnosis Date Noted  . Squamous cell carcinoma, nonkeratinizing 05/30/2020  . Osteoarthritis of both knees 02/17/2018  . Diabetic retinopathy ([place]) 07/11/2015  . PAC (premature atrial contraction) 07/11/2015  . CKD (chronic kidney disease), stage III ([place]) 07/05/2014  . Osteoporosis 12/12/2009  . INTENTION TREMOR 10/11/2008  . History of depression 06/14/2008  . Insomnia 06/14/2008  . ANEMIA, OTHER UNSPEC 01/03/2008  . Diabetes Type II with retinopathy  05/15/2007  . Hyperlipidemia associated with type 2 diabetes mellitus ([place]) 05/15/2007  . Hypertension associated with diabetes ([place]) 05/15/2007   Reviewed chart for medication changes ahead of medication coordination call.  12/12/2020 OV PCP Dr. Yong Channel; reduce metformin to 850 mg a day due to 15 lb weight loss, if sugars stay below 150 for 3 days may stop completely, discontinue simvastatin due to strength barrier, reduce losartan to 25 mg due to slight hyperkalemia  Future Appointments  Date Time Provider [place]  01/21/2021  1:00 PM [facility] CCM PHARMACIST [facility]  03/20/2021 11:00 AM Marin Olp, MD [facility]     April D Cal[place], [place] Pharmacist Assistant [phone]   Follow-Up:  Pharmacist Review

## 2020-12-24 DIAGNOSIS — Z5111 Encounter for antineoplastic chemotherapy: Secondary | ICD-10-CM | POA: Diagnosis not present

## 2020-12-24 DIAGNOSIS — Z5112 Encounter for antineoplastic immunotherapy: Secondary | ICD-10-CM | POA: Diagnosis not present

## 2020-12-24 DIAGNOSIS — K1231 Oral mucositis (ulcerative) due to antineoplastic therapy: Secondary | ICD-10-CM | POA: Diagnosis not present

## 2020-12-24 DIAGNOSIS — C300 Malignant neoplasm of nasal cavity: Secondary | ICD-10-CM | POA: Diagnosis not present

## 2020-12-24 DIAGNOSIS — T451X5A Adverse effect of antineoplastic and immunosuppressive drugs, initial encounter: Secondary | ICD-10-CM | POA: Diagnosis not present

## 2020-12-24 DIAGNOSIS — G893 Neoplasm related pain (acute) (chronic): Secondary | ICD-10-CM | POA: Diagnosis not present

## 2020-12-24 DIAGNOSIS — D6481 Anemia due to antineoplastic chemotherapy: Secondary | ICD-10-CM | POA: Diagnosis not present

## 2020-12-29 ENCOUNTER — Other Ambulatory Visit: Payer: Self-pay | Admitting: Family Medicine

## 2020-12-30 ENCOUNTER — Telehealth: Payer: Self-pay

## 2020-12-30 NOTE — Telephone Encounter (Signed)
She was on Metformin 850 mg twice daily before.  We went down to once daily and discussed if remained less than 150 could stop completely.  Since numbers are back up over 150 she can restart the Metformin and her 50 mg once daily-let me know in 3 to 4 weeks how she is doing with blood sugars-sooner if any blood sugars below 80

## 2020-12-30 NOTE — Telephone Encounter (Signed)
Called back and made pt aware.

## 2020-12-30 NOTE — Telephone Encounter (Signed)
It certainly can- restarting med still makes sense - if #s drop below 110 or so please let me know- really want to avoid lows for her

## 2020-12-30 NOTE — Telephone Encounter (Signed)
See below

## 2020-12-30 NOTE — Telephone Encounter (Signed)
Pt. States in her physical last month that Dr. Yong Channel took her off her diabetes medication because it was making her a little dizzy. Pt notices that her glucose number is reading a little high in the morning. This morning it was 170. Pt states its running from 160-165 most every morning since she has stopped taking her medication. Pt needs to know how to proceed.

## 2020-12-30 NOTE — Telephone Encounter (Signed)
Just FYI, called and spoke with pt, pt states she is on chemo for  10 days and off for 11 for the next 3 months and she wanted you to be aware and states she wondered if that could have caused her increase in numbers.

## 2021-01-13 ENCOUNTER — Telehealth: Payer: Self-pay

## 2021-01-13 NOTE — Telephone Encounter (Signed)
Since doing well- lets continue current medicine- blood sugars look great! Tell her thanks for update. Let me know if getting numbers less than 80 or above 150 regularly

## 2021-01-13 NOTE — Telephone Encounter (Signed)
Pt called following up from visit back in February. Pt states she resumed taking her metformin and she is doing well overall. BS is ranging from 109-115. Pt states sometimes it is elevated but not often. Pt asked what Dr. Yong Channel would like for her to do now. Please advise.

## 2021-01-13 NOTE — Telephone Encounter (Signed)
Called and spoke with pt and gave below message.

## 2021-01-13 NOTE — Telephone Encounter (Signed)
Please advise 

## 2021-01-14 DIAGNOSIS — C300 Malignant neoplasm of nasal cavity: Secondary | ICD-10-CM | POA: Diagnosis not present

## 2021-01-19 ENCOUNTER — Encounter: Payer: Self-pay | Admitting: Family Medicine

## 2021-01-21 ENCOUNTER — Ambulatory Visit: Payer: Medicare HMO

## 2021-01-21 NOTE — Progress Notes (Signed)
  Chronic Care Management   Outreach Note   Name: Laura Salas MRN: [id number] DOB: [birth year]  Referred by: Marin Olp, MD Reason for referral: Telephone Appointment with [place] Pharmacist, Madelin Rear.   An unsuccessful telephone outreach was attempted today. The patient was referred to the pharmacist for assistance with care management and care coordination.   Telephone appointment with clinical pharmacist today (01/21/2021) at 1pm. If patient immediately returns call, transfer to [phone]. Otherwise, please provide this number so patient can reschedule visit.   Madelin Rear, Pharm.D., BCGP Clinical Pharmacist [facility] [phone]

## 2021-01-29 ENCOUNTER — Ambulatory Visit (INDEPENDENT_AMBULATORY_CARE_PROVIDER_SITE_OTHER): Payer: Medicare HMO

## 2021-01-29 DIAGNOSIS — Z0000 Encounter for general adult medical examination without abnormal findings: Secondary | ICD-10-CM | POA: Diagnosis not present

## 2021-01-29 NOTE — Progress Notes (Signed)
Virtual Visit via Telephone Note  I connected with  Laura Salas on 01/29/21 at  3:15 PM EDT by telephone and verified that I am speaking with the correct person using two identifiers.  Medicare Annual Wellness visit completed telephonically due to Covid-19 pandemic.   Persons participating in this call: This Health Coach and this patient.   Location: Patient: Home Provider: Office    I discussed the limitations, risks, security and privacy concerns of performing an evaluation and management service by telephone and the availability of in person appointments. The patient expressed understanding and agreed to proceed.  Unable to perform video visit due to video visit attempted and failed and/or patient does not have video capability.   Some vital signs may be absent or patient reported.   Willette Brace, LPN    Subjective:   Laura Salas is a 87 y.o. female who presents for Medicare Annual (Subsequent) preventive examination.  Review of Systems     Cardiac Risk Factors include: diabetes mellitus;advanced age (>87men, >19 women);hypertension;dyslipidemia     Objective:    There were no vitals filed for this visit. There is no height or weight on file to calculate BMI.  Advanced Directives 01/29/2021 08/17/2019 07/28/2018 07/15/2017 06/24/2013  Does Patient Have a Medical Advance Directive? Yes Yes Yes Yes Patient does not have advance directive  Type of Advance Directive [place];Living will [place];Living will Living will;Healthcare Power of Attorney - -  Does patient want to make changes to medical advance directive? - No - Patient declined No - Patient declined - -  Copy of [place] in Chart? Yes - validated most recent copy scanned in chart (See row information) No - copy requested No - copy requested - -    Current Medications (verified) Outpatient Encounter Medications as of 01/29/2021  Medication Sig  .  capecitabine (XELODA) 500 MG tablet Take by mouth.  . cyanocobalamin 1000 MCG tablet Take by mouth.  . dexamethasone (DECADRON) 1 MG tablet Take 1 mg by mouth once.  . diclofenac sodium (VOLTAREN) 1 % GEL Apply 2 g topically 4 (four) times daily.  . metFORMIN (GLUCOPHAGE) 850 MG tablet Take 1 tablet (850 mg total) by mouth daily with breakfast.  . ONETOUCH ULTRA test strip TEST DAILY AS DIRECTED  . prochlorperazine (COMPAZINE) 10 MG tablet Take 10 mg by mouth every 6 (six) hours.  . traMADol (ULTRAM) 50 MG tablet Take 50 mg by mouth every 6 (six) hours as needed.  . Vitamin D, Ergocalciferol, (DRISDOL) 1.25 MG (50000 UNIT) CAPS capsule TAKE 1 CAPSULE (50,000 UNITS TOTAL) BY MOUTH EVERY 7 (SEVEN) DAYS.  Marland Kitchen zolpidem (AMBIEN) 5 MG tablet Take 1 tablet (5 mg total) by mouth at bedtime as needed. for sleep   No facility-administered encounter medications on file as of 01/29/2021.    Allergies (verified) Alendronate sodium, Codeine sulfate, and Ibandronate sodium   History: Past Medical History:  Diagnosis Date  . Depression   . Diabetes mellitus   . Hyperlipidemia   . Hypertension   . Osteoporosis    Past Surgical History:  Procedure Laterality Date  . CARPAL TUNNEL RELEASE     left  . CATARACT EXTRACTION    . CESAREAN SECTION     Family History  Problem Relation Age of Onset  . Cancer Mother        colon  . Heart attack Father   . Heart attack Brother   . Cancer Brother  stomach  . Other Sister        died at 20   Social History   Socioeconomic History  . Marital status: Widowed    Spouse name: Not on file  . Number of children: Not on file  . Years of education: Not on file  . Highest education level: Associate degree: academic program  Occupational History    Comment: Location manager   Tobacco Use  . Smoking status: Never Smoker  . Smokeless tobacco: Never Used  Vaping Use  . Vaping Use: Never used  Substance and Sexual Activity  . Alcohol use: No   . Drug use: No  . Sexual activity: Not on file  Other Topics Concern  . Not on file  Social History Narrative   Grew up in [place], moved to [place] around 2005. Widowed 10 years ago of "emboli". Only son is a Company secretary at [employer] so moved close for that reason.  2 grown grandchildren (grandson is in [place]; granddaughter in grad school for physical therapy)       Lives in home with son but has separate portion of home. Eats dinner together at time.       Hobbies: shopping, movies, [place] visiting 2 sisters   Social Determinants of Health   Financial Resource Strain: Low Risk   . Difficulty of Paying Living Expenses: Not hard at all  Food Insecurity: No Food Insecurity  . Worried About Charity fundraiser in the Last Year: Never true  . Ran Out of Food in the Last Year: Never true  Transportation Needs: No Transportation Needs  . Lack of Transportation (Medical): No  . Lack of Transportation (Non-Medical): No  Physical Activity: Inactive  . Days of Exercise per Week: 0 days  . Minutes of Exercise per Session: 0 min  Stress: Stress Concern Present  . Feeling of Stress : To some extent  Social Connections: Moderately Isolated  . Frequency of Communication with Friends and Family: More than three times a week  . Frequency of Social Gatherings with Friends and Family: Twice a week  . Attends Religious Services: 1 to 4 times per year  . Active Member of Clubs or Organizations: No  . Attends Archivist Meetings: Never  . Marital Status: Widowed    Tobacco Counseling Counseling given: Not Answered   Clinical Intake:  Pre-visit preparation completed: Yes  Pain : No/denies pain     BMI - recorded: 21.1 Nutritional Status: BMI of 19-24  Normal Nutritional Risks: None Diabetes: Yes CBG done?: Yes (108) CBG resulted in Enter/ Edit results?: No Did pt. bring in CBG monitor from home?: No  How often do you need to have someone help you  when you read instructions, pamphlets, or other written materials from your doctor or pharmacy?: 1 - Never  Diabetic?Nutrition Risk Assessment:  Has the patient had any N/V/D within the last 2 months?  No  Does the patient have any non-healing wounds?  No  Has the patient had any unintentional weight loss or weight gain?  No   Diabetes:  Is the patient diabetic?  Yes  If diabetic, was a CBG obtained today?  Yes  Did the patient bring in their glucometer from home?  No  How often do you monitor your CBG's? Daily.   Financial Strains and Diabetes Management:  Are you having any financial strains with the device, your supplies or your medication? No .  Does the patient want to be  seen by Chronic Care Management for management of their diabetes?  No  Would the patient like to be referred to a Nutritionist or for Diabetic Management?  No   Diabetic Exams:  Diabetic Eye Exam: Completed 07/21/20 Diabetic Foot Exam: Completed 11/23/19 due at next appt  03/20/21   Interpreter Needed?: No  Information entered by :: Charlott Rakes, LPN   Activities of Daily Living In your present state of health, do you have any difficulty performing the following activities: 01/29/2021  Hearing? N  Vision? N  Difficulty concentrating or making decisions? N  Walking or climbing stairs? N  Comment don't use stairs  Dressing or bathing? N  Doing errands, shopping? N  Preparing Food and eating ? N  Using the Toilet? N  In the past six months, have you accidently leaked urine? N  Do you have problems with loss of bowel control? N  Managing your Medications? N  Managing your Finances? N  Housekeeping or managing your Housekeeping? N  Some recent data might be hidden    Patient Care Team: Marin Olp, MD as PCP - General (Family Medicine) Hayden Pedro, MD as Consulting Physician (Ophthalmology) Netta Cedars, MD as Consulting Physician (Orthopedic Surgery) [place], Jennefer Bravo, MD as  Referring Physician (Dermatology) Rosemary Holms, DPM as Consulting Physician (Podiatry) Madelin Rear, [facility] as Pharmacist (Pharmacist)  Indicate any recent Medical Services you may have received from other than [facility] providers in the past year (date may be approximate).     Assessment:   This is a routine wellness examination for Laura Salas.  Hearing/Vision screen  Hearing Screening   125Hz  250Hz  500Hz  1000Hz  2000Hz  3000Hz  4000Hz  6000Hz  8000Hz   Right ear:           Left ear:           Comments: Pt denies any hearing issues   Vision Screening Comments: Dr Mendel Corning follows up for annual eye exams   Dietary issues and exercise activities discussed: Current Exercise Habits: The patient does not participate in regular exercise at present  Goals    . patient     Exercise as much as she can!     . Patient Stated     Live day by day and have a happy life     . PharmD Care Plan     CARE PLAN ENTRY (see longitudinal plan of care for additional care plan information)  Current Barriers:  . Chronic Disease Management support, education, and care coordination needs related to Hypertension, Hyperlipidemia, and Diabetes   Hypertension BP Readings from Last 3 Encounters:  07/04/20 118/76  05/30/20 140/62  11/23/19 (!) 150/70   . Pharmacist Clinical Goal(s): o Over the next 180 days, patient will work with PharmD and providers to maintain BP goal <130/80 . Current regimen:  o Losartan 25 mg once daily (half of 50 mg tablet daily) . Interventions: o Diet/exercise recommendations . Patient self care activities - Over the next 180 days, patient will: o Check BP once every 1-2 weeks, document, and provide at future appointments o Ensure daily salt intake < 2300 mg/day  Hyperlipidemia Lab Results  Component Value Date/Time   LDLCALC 56 05/30/2020 10:02 AM   LDLDIRECT 61.0 01/03/2015 09:22 AM   . Pharmacist Clinical Goal(s): o Over the next 180 days, patient will work with PharmD  and providers to maintain LDL goal < 70 . Current regimen:  o Simvastatin 40 mg once daily . Interventions: o Diet/exercise recommendations  . Patient self  care activities - Over the next 180 days, patient will: o Continue current management  Diabetes Lab Results  Component Value Date/Time   HGBA1C 6.8 (H) 05/30/2020 10:02 AM   HGBA1C 6.7 (H) 11/23/2019 09:56 AM   . Pharmacist Clinical Goal(s): o Over the next 180 days, patient will work with PharmD and providers to maintain A1c goal <7% . Current regimen:  o Metformin 850 mg twice daily . Interventions: o Diet/exercise recommendations . Patient self care activities - Over the next 180 days, patient will: o Check blood sugar as directed, document, and provide at future appointments o Contact provider with any episodes of hypoglycemia  Medication management . Pharmacist Clinical Goal(s): o Over the next 180 days, patient will work with PharmD and providers to maintain optimal medication adherence . Current pharmacy: [facility] . Interventions o Comprehensive medication review performed. o Continue current medication management strategy . Patient self care activities - Over the next 180 days, patient will: o Take medications as prescribed o Report any questions or concerns to PharmD and/or provider(s) Initial goal documentation.      Depression Screen PHQ 2/9 Scores 01/29/2021 12/12/2020 05/30/2020 11/23/2019 08/17/2019 07/28/2018 07/15/2017  PHQ - 2 Score 0 0 0 0 0 0 0  PHQ- 9 Score - - 1 1 - 0 -    Fall Risk Fall Risk  01/29/2021 12/12/2020 08/17/2019 07/28/2018 07/15/2017  Falls in the past year? 0 1 0 No No  Comment - - - - 4 to5 years ago; fell due to dehydration  Number falls in past yr: 0 1 - - -  Injury with Fall? 0 0 0 - -  Risk for fall due to : Impaired vision;Impaired balance/gait - - - Impaired mobility  Risk for fall due to: Comment - - - - at risk due to osteoporosis   Follow up - - Falls evaluation completed;Education  provided;Falls prevention discussed - -    FALL RISK PREVENTION PERTAINING TO THE HOME:  Any stairs in or around the home? Yes  If so, are there any without handrails? No  Home free of loose throw rugs in walkways, pet beds, electrical cords, etc? Yes  Adequate lighting in your home to reduce risk of falls? Yes   ASSISTIVE DEVICES UTILIZED TO PREVENT FALLS:  Life alert? No  Use of a cane, walker or w/c? Yes  Grab bars in the bathroom? Yes  Shower chair or bench in shower? Yes  Elevated toilet seat or a handicapped toilet? Yes   TIMED UP AND GO:  Was the test performed? No .     Cognitive Function: MMSE - Mini Mental State Exam 07/15/2017  Not completed: (No Data)     6CIT Screen 01/29/2021 08/17/2019  What Year? 0 points 0 points  What month? 0 points 0 points  What time? - 0 points  Count back from 20 0 points 0 points  Months in reverse 0 points 0 points  Repeat phrase 0 points 0 points  Total Score - 0    Immunizations Immunization History  Administered Date(s) Administered  . Fluad Quad(high Dose 65+) 08/17/2019, 07/10/2020  . H1N1 10/11/2008  . Influenza Split 08/06/2011, 08/13/2011, 08/11/2012  . Influenza Whole 08/25/2007, 08/09/2008, 08/01/2009, 07/24/2010  . Influenza, High Dose Seasonal PF 07/16/2016, 07/15/2017, 07/28/2018  . Influenza,inj,Quad PF,6+ Mos 06/29/2013, 07/05/2014, 07/11/2015  . PFIZER(Purple Top)SARS-COV-2 Vaccination 12/15/2019, 01/07/2020  . Pneumococcal Conjugate-13 01/03/2015  . Pneumococcal Polysaccharide-23 11/10/2007  . Td 04/17/2010  . Zoster 04/17/2010  . Zoster Recombinat (Shingrix)  04/11/2020    TDAP status: Due, Education has been provided regarding the importance of this vaccine. Advised may receive this vaccine at local pharmacy or Health Dept. Aware to provide a copy of the vaccination record if obtained from local pharmacy or Health Dept. Verbalized acceptance and understanding.  Flu Vaccine status: Up to  date  Pneumococcal vaccine status: Up to date  Covid-19 vaccine status: Completed vaccines Booster recommended   Qualifies for Shingles Vaccine? Yes   Zostavax completed Yes   Shingrix Completed?: Yes  Screening Tests Health Maintenance  Topic Date Due  . COVID-19 Vaccine (3 - Pfizer risk 4-dose series) 02/04/2020  . TETANUS/TDAP  04/17/2020  . FOOT EXAM  11/22/2020  . OPHTHALMOLOGY EXAM  04/21/2021  . INFLUENZA VACCINE  05/25/2021  . URINE MICROALBUMIN  05/30/2021  . HEMOGLOBIN A1C  06/11/2021  . DEXA SCAN  Completed  . PNA vac Low Risk Adult  Completed  . HPV VACCINES  Aged Out    Health Maintenance  Health Maintenance Due  Topic Date Due  . COVID-19 Vaccine (3 - Pfizer risk 4-dose series) 02/04/2020  . TETANUS/TDAP  04/17/2020  . FOOT EXAM  11/22/2020    Colorectal cancer screening: No longer required.   Mammogram status: No longer required due to age.  Bone Density status: Completed 10/06/09. Results reflect: Bone density results: OSTEOPOROSIS. Repeat every 2 years.   Additional Screening:    Vision Screening: Recommended annual ophthalmology exams for early detection of glaucoma and other disorders of the eye. Is the patient up to date with their annual eye exam?  Yes  Who is the provider or what is the name of the office in which the patient attends annual eye exams? Dr Mendel Corning  If pt is not established with a provider, would they like to be referred to a provider to establish care? No .   Dental Screening: Recommended annual dental exams for proper oral hygiene  Community Resource Referral / Chronic Care Management: CRR required this visit?  No   CCM required this visit?  No      Plan:     I have personally reviewed and noted the following in the patient's chart:   . Medical and social history . Use of alcohol, tobacco or illicit drugs  . Current medications and supplements . Functional ability and status . Nutritional status . Physical  activity . Advanced directives . List of other physicians . Hospitalizations, surgeries, and ER visits in previous 12 months . Vitals . Screenings to include cognitive, depression, and falls . Referrals and appointments  In addition, I have reviewed and discussed with patient certain preventive protocols, quality metrics, and best practice recommendations. A written personalized care plan for preventive services as well as general preventive health recommendations were provided to patient.     Willette Brace, LPN   03/26/2632   Nurse Notes: None

## 2021-01-29 NOTE — Patient Instructions (Addendum)
Ms. Laura Salas , Thank you for taking time to come for your Medicare Wellness Visit. I appreciate your ongoing commitment to your health goals. Please review the following plan we discussed and let me know if I can assist you in the future.   Screening recommendations/referrals: Colonoscopy: No longer required  Mammogram: No longer required  Bone Density: Completed 10/06/09 Recommended yearly ophthalmology/optometry visit for glaucoma screening and checkup Recommended yearly dental visit for hygiene and checkup  Vaccinations: Influenza vaccine: Up to date Pneumococcal vaccine: Up to date Tdap vaccine: Due and discussed Shingles vaccine: 1st dose 04/11/20   Covid-19:Completed 2/20 & 01/07/20  Advanced directives: Please bring a copy of your health care power of attorney and living will to the office at your convenience.  Conditions/risks identified: Get up day by day and keep being happy   Next appointment: Follow up in one year for your annual wellness visit     Preventive Care 65 Years and Older, Female Preventive care refers to lifestyle choices and visits with your health care provider that can promote health and wellness. What does preventive care include?  A yearly physical exam. This is also called an annual well check.  Dental exams once or twice a year.  Routine eye exams. Ask your health care provider how often you should have your eyes checked.  Personal lifestyle choices, including:  Daily care of your teeth and gums.  Regular physical activity.  Eating a healthy diet.  Avoiding tobacco and drug use.  Limiting alcohol use.  Practicing safe sex.  Taking low-dose aspirin every day.  Taking vitamin and mineral supplements as recommended by your health care provider. What happens during an annual well check? The services and screenings done by your health care provider during your annual well check will depend on your age, overall health, lifestyle risk factors,  and family history of disease. Counseling  Your health care provider may ask you questions about your:  Alcohol use.  Tobacco use.  Drug use.  Emotional well-being.  Home and relationship well-being.  Sexual activity.  Eating habits.  History of falls.  Memory and ability to understand (cognition).  Work and work Statistician.  Reproductive health. Screening  You may have the following tests or measurements:  Height, weight, and BMI.  Blood pressure.  Lipid and cholesterol levels. These may be checked every 5 years, or more frequently if you are over 10 years old.  Skin check.  Lung cancer screening. You may have this screening every year starting at age 53 if you have a 30-pack-year history of smoking and currently smoke or have quit within the past 15 years.  Fecal occult blood test (FOBT) of the stool. You may have this test every year starting at age 40.  Flexible sigmoidoscopy or colonoscopy. You may have a sigmoidoscopy every 5 years or a colonoscopy every 10 years starting at age 53.  Hepatitis C blood test.  Hepatitis B blood test.  Sexually transmitted disease (STD) testing.  Diabetes screening. This is done by checking your blood sugar (glucose) after you have not eaten for a while (fasting). You may have this done every 1-3 years.  Bone density scan. This is done to screen for osteoporosis. You may have this done starting at age 76.  Mammogram. This may be done every 1-2 years. Talk to your health care provider about how often you should have regular mammograms. Talk with your health care provider about your test results, treatment options, and if necessary, the need  for more tests. Vaccines  Your health care provider may recommend certain vaccines, such as:  Influenza vaccine. This is recommended every year.  Tetanus, diphtheria, and acellular pertussis (Tdap, Td) vaccine. You may need a Td booster every 10 years.  Zoster vaccine. You may need  this after age 65.  Pneumococcal 13-valent conjugate (PCV13) vaccine. One dose is recommended after age 105.  Pneumococcal polysaccharide (PPSV23) vaccine. One dose is recommended after age 46. Talk to your health care provider about which screenings and vaccines you need and how often you need them. This information is not intended to replace advice given to you by your health care provider. Make sure you discuss any questions you have with your health care provider. Document Released: 11/07/2015 Document Revised: 06/30/2016 Document Reviewed: 08/12/2015 Elsevier Interactive Patient Education  2017 [employer] Prevention in the Home Falls can cause injuries. They can happen to people of all ages. There are many things you can do to make your home safe and to help prevent falls. What can I do on the outside of my home?  Regularly fix the edges of walkways and driveways and fix any cracks.  Remove anything that might make you trip as you walk through a door, such as a raised step or threshold.  Trim any bushes or trees on the path to your home.  Use bright outdoor lighting.  Clear any walking paths of anything that might make someone trip, such as rocks or tools.  Regularly check to see if handrails are loose or broken. Make sure that both sides of any steps have handrails.  Any raised decks and porches should have guardrails on the edges.  Have any leaves, snow, or ice cleared regularly.  Use sand or salt on walking paths during winter.  Clean up any spills in your garage right away. This includes oil or grease spills. What can I do in the bathroom?  Use night lights.  Install grab bars by the toilet and in the tub and shower. Do not use towel bars as grab bars.  Use non-skid mats or decals in the tub or shower.  If you need to sit down in the shower, use a plastic, non-slip stool.  Keep the floor dry. Clean up any water that spills on the floor as soon as it  happens.  Remove soap buildup in the tub or shower regularly.  Attach bath mats securely with double-sided non-slip rug tape.  Do not have throw rugs and other things on the floor that can make you trip. What can I do in the bedroom?  Use night lights.  Make sure that you have a light by your bed that is easy to reach.  Do not use any sheets or blankets that are too big for your bed. They should not hang down onto the floor.  Have a firm chair that has side arms. You can use this for support while you get dressed.  Do not have throw rugs and other things on the floor that can make you trip. What can I do in the kitchen?  Clean up any spills right away.  Avoid walking on wet floors.  Keep items that you use a lot in easy-to-reach places.  If you need to reach something above you, use a strong step stool that has a grab bar.  Keep electrical cords out of the way.  Do not use floor polish or wax that makes floors slippery. If you must use wax, use  non-skid floor wax.  Do not have throw rugs and other things on the floor that can make you trip. What can I do with my stairs?  Do not leave any items on the stairs.  Make sure that there are handrails on both sides of the stairs and use them. Fix handrails that are broken or loose. Make sure that handrails are as long as the stairways.  Check any carpeting to make sure that it is firmly attached to the stairs. Fix any carpet that is loose or worn.  Avoid having throw rugs at the top or bottom of the stairs. If you do have throw rugs, attach them to the floor with carpet tape.  Make sure that you have a light switch at the top of the stairs and the bottom of the stairs. If you do not have them, ask someone to add them for you. What else can I do to help prevent falls?  Wear shoes that:  Do not have high heels.  Have rubber bottoms.  Are comfortable and fit you well.  Are closed at the toe. Do not wear sandals.  If you  use a stepladder:  Make sure that it is fully opened. Do not climb a closed stepladder.  Make sure that both sides of the stepladder are locked into place.  Ask someone to hold it for you, if possible.  Clearly mark and make sure that you can see:  Any grab bars or handrails.  First and last steps.  Where the edge of each step is.  Use tools that help you move around (mobility aids) if they are needed. These include:  Canes.  Walkers.  Scooters.  Crutches.  Turn on the lights when you go into a dark area. Replace any light bulbs as soon as they burn out.  Set up your furniture so you have a clear path. Avoid moving your furniture around.  If any of your floors are uneven, fix them.  If there are any pets around you, be aware of where they are.  Review your medicines with your doctor. Some medicines can make you feel dizzy. This can increase your chance of falling. Ask your doctor what other things that you can do to help prevent falls. This information is not intended to replace advice given to you by your health care provider. Make sure you discuss any questions you have with your health care provider. Document Released: 08/07/2009 Document Revised: 03/18/2016 Document Reviewed: 11/15/2014 Elsevier Interactive Patient Education  2017 [employer].

## 2021-02-18 DIAGNOSIS — Z5111 Encounter for antineoplastic chemotherapy: Secondary | ICD-10-CM | POA: Diagnosis not present

## 2021-02-18 DIAGNOSIS — Z5112 Encounter for antineoplastic immunotherapy: Secondary | ICD-10-CM | POA: Diagnosis not present

## 2021-02-18 DIAGNOSIS — G893 Neoplasm related pain (acute) (chronic): Secondary | ICD-10-CM | POA: Diagnosis not present

## 2021-02-18 DIAGNOSIS — C300 Malignant neoplasm of nasal cavity: Secondary | ICD-10-CM | POA: Diagnosis not present

## 2021-03-04 ENCOUNTER — Other Ambulatory Visit: Payer: Self-pay

## 2021-03-04 ENCOUNTER — Ambulatory Visit (INDEPENDENT_AMBULATORY_CARE_PROVIDER_SITE_OTHER): Payer: Medicare HMO | Admitting: Family Medicine

## 2021-03-04 ENCOUNTER — Encounter: Payer: Self-pay | Admitting: Family Medicine

## 2021-03-04 VITALS — BP 117/71 | HR 101 | Temp 98.3°F | Ht 61.0 in | Wt 105.0 lb

## 2021-03-04 DIAGNOSIS — N183 Chronic kidney disease, stage 3 unspecified: Secondary | ICD-10-CM

## 2021-03-04 DIAGNOSIS — C4492 Squamous cell carcinoma of skin, unspecified: Secondary | ICD-10-CM | POA: Diagnosis not present

## 2021-03-04 DIAGNOSIS — W19XXXA Unspecified fall, initial encounter: Secondary | ICD-10-CM

## 2021-03-04 DIAGNOSIS — N390 Urinary tract infection, site not specified: Secondary | ICD-10-CM

## 2021-03-04 LAB — POCT URINALYSIS DIPSTICK
Bilirubin, UA: NEGATIVE
Blood, UA: POSITIVE
Glucose, UA: NEGATIVE
Ketones, UA: NEGATIVE
Nitrite, UA: POSITIVE
Protein, UA: POSITIVE — AB
Spec Grav, UA: 1.02 (ref 1.010–1.025)
Urobilinogen, UA: 0.2 E.U./dL
pH, UA: 5.5 (ref 5.0–8.0)

## 2021-03-04 MED ORDER — NITROFURANTOIN MONOHYD MACRO 100 MG PO CAPS: 100.0000 mg | ORAL_CAPSULE | Freq: Two times a day (BID) | ORAL | 0 refills | Status: DC

## 2021-03-04 NOTE — Patient Instructions (Signed)
It was very nice to see you today!  You have a UTI.  Start antibiotic.  Please make sure that she get plenty of fluids.  Please let us know if you have any change your mind about seeing a physical therapist.  Take care, Dr Jerline Pain

## 2021-03-04 NOTE — Progress Notes (Signed)
   Laura Salas is a 87 y.o. female who presents today for an office visit.  Assessment/Plan:  New/Acute Problems: UTI UA with positive nitrites and leukocytes.  Will start Montura.  Check urine culture.  Encourage good oral hydration.  Falls Patient with 3 falls over the last couple of weeks.  Recommended referral to physical therapy however patient is extremely reluctant and hesitant to do this.  She would like to get her UTI treated first to see if symptoms improve.  We also had lengthy discussion regarding her fluid intake and her resistance to increase fluids.  States that she just does not like water.  Chronic Problems Addressed Today: Squamous cell carcinoma Recently on chemotherapy.  This could be contributing some of her imbalance.  As above we recommended referral to physical therapy due to her frequent falls.  Discussed with patient I am concerned that she may injure herself during the fall or she may become unable to get up.  She is extremely hesitant to see physical therapy this time.  CKD stage III  Recent creatinine at [facility] was within normal limits.  Encouraged good oral hydration as above.    Subjective:  HPI:  Patient here with her son today.  Concern for possible UTI and dehydration.  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  Our last few weeks she has fallen 3 times.  Patient states that she has lost her balance each of these times.  Her son states that last time she had this she had a UTI and was dehydrated.  Patient admits that she has not been taking fluids like she did previously.  She cannot give a clear answer as to why this is the case other than she just does not want to.  She has had urinary symptoms for the last several weeks as well.  No fevers or chills.  No nausea or vomiting.        Objective:  Physical Exam: BP 117/71   Pulse (!) 101   Temp 98.3  F (36.8 C) (Temporal)   Ht 5\' 1"  (1.549 m)   Wt 105 lb (47.6 kg)   SpO2 100%   BMI 19.84 kg/m   Gen: No acute distress, resting comfortably Neuro: Grossly normal, moves all extremities Psych: Normal affect and thought content      Agness Sibrian M. Jerline Pain, MD 03/04/2021 10:42 AM

## 2021-03-05 LAB — URINE CULTURE
MICRO NUMBER:: 11877091
SPECIMEN QUALITY:: ADEQUATE

## 2021-03-06 NOTE — Progress Notes (Signed)
Please inform patient of the following:  Her urine culture is inconclusive.  She should finish her antibiotics and I would like for her to let us know if her symptoms are not improving.

## 2021-03-11 ENCOUNTER — Telehealth: Payer: Self-pay

## 2021-03-11 NOTE — Telephone Encounter (Signed)
Attempted to contact patient's son James  to schedule a Palliative Care consult appointment. No answer left a message to return call.  

## 2021-03-12 ENCOUNTER — Telehealth: Payer: Self-pay

## 2021-03-12 NOTE — Telephone Encounter (Signed)
Spoke with patient's son Laverna Peace and scheduled an in-person Palliative Consult for 04/10/21 @ 9AM  COVID screening was negative. No pets in home. Patient lives with son & DIL.   Consent obtained; updated Outlook/Netsmart/Team List and Epic.   Family is aware they may be receiving a call from NP the day before or day of to confirm appointment.

## 2021-03-19 NOTE — Patient Instructions (Addendum)
Home health referral for PT/OT/possible home health aide (we will try our best but not often covered and not all agencies even have this available). We will call you within two weeks about your referral. If you do not hear within 2 weeks, give [place] a call.   We opted to hold off on labs since having done at [place]- if they could possibly do an a1c with next labs under diabetes that would be great

## 2021-03-19 NOTE — Progress Notes (Signed)
Phone [phone] In person visit   Subjective:   Laura Salas is a 87 y.o. year old very pleasant female patient who presents for/with See problem oriented charting Chief Complaint  Patient presents with  . Hypertension  . Diabetes   This visit occurred during the SARS-CoV-2 public health emergency.  Safety protocols were in place, including screening questions prior to the visit, additional usage of staff PPE, and extensive cleaning of exam room while observing appropriate contact time as indicated for disinfecting solutions.   Past Medical History-  Patient Active Problem List   Diagnosis Date Noted  . Squamous cell carcinoma, nonkeratinizing 05/30/2020    Priority: High  . Diabetic retinopathy ([place]) 07/11/2015    Priority: High  . Diabetes Type II with retinopathy 05/15/2007    Priority: High  . PAC (premature atrial contraction) 07/11/2015    Priority: Medium  . CKD (chronic kidney disease), stage III ([place]) 07/05/2014    Priority: Medium  . Insomnia 06/14/2008    Priority: Medium  . Hyperlipidemia associated with type 2 diabetes mellitus ([place]) 05/15/2007    Priority: Medium  . Hypertension associated with diabetes ([place]) 05/15/2007    Priority: Medium  . Osteoarthritis of both knees 02/17/2018    Priority: Low  . Osteoporosis 12/12/2009    Priority: Low  . INTENTION TREMOR 10/11/2008    Priority: Low  . History of depression 06/14/2008    Priority: Low  . ANEMIA, OTHER UNSPEC 01/03/2008    Priority: Low    Medications- reviewed and updated Current Outpatient Medications  Medication Sig Dispense Refill  . dexamethasone (DECADRON) 1 MG tablet Take 1 mg by mouth once.    . diclofenac sodium (VOLTAREN) 1 % GEL Apply 2 g topically 4 (four) times daily. 100 g 5  . metFORMIN (GLUCOPHAGE) 850 MG tablet Take 1 tablet (850 mg total) by mouth daily with breakfast. 90 tablet 3  . ONETOUCH ULTRA test strip TEST DAILY AS DIRECTED 100 strip 3  . prochlorperazine  (COMPAZINE) 10 MG tablet Take 10 mg by mouth every 6 (six) hours.    . traMADol (ULTRAM) 50 MG tablet Take 50 mg by mouth every 6 (six) hours as needed.    . zolpidem (AMBIEN) 5 MG tablet Take 1 tablet (5 mg total) by mouth at bedtime as needed. for sleep 31 tablet 5   No current facility-administered medications for this visit.     Objective:  BP 122/72 (BP Location: Left Arm, Patient Position: Sitting, Cuff Size: Normal)   Pulse (!) 104   Temp 98.2 F (36.8 C) (Temporal)   Ht 5\' 1"  (1.549 m)   Wt 106 lb 8 oz (48.3 kg)   SpO2 97%   BMI 20.12 kg/m  Gen: NAD, resting comfortably CV: high normal R. 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 Diagnosed as nonkeatinizing squamous cell carcinoma.  MRI brain with invasion into anterior cranial fossa.   Deemed unresectable by ENT with [facility] 05/20/20 and not amenable to curative intent radiation.plan is oncology referral to consider palliative immunotherapy.    Cancelled 2nd line chemo palliative this Wednesday. Plan is for next month around the 24th and repeat imaging.   Still having balance issues and  not improving. Treated for UTI with Dr. Jerline Pain earlier this month- inconclusive results (possible contaminant)-was having some frequency- slightly better after antibiotics A/P: unresectable squamous cell carcinoma of left orbit- son reports was told would only have another MRI if concern for progression and she has MRi scheduled- having some side effects from palliative chemotherapy  -more trouble with eye sight - has had 4 falls as well. Using walker some but not consistently- we discussed using walker consistently- if has falls even with walker  may need to come off ambien which would be hard because does help her at least get some sleep - we will refer to home health to see if we can help with strengthening. She also could certainly benefit from a home health aide if this is covered  # Diabetes with retinopathy S: Medication:metformin 850 mg twice daily--> reduce to daily CBGs- 122 this AM Lab Results  Component Value Date   HGBA1C 6.4 (A) 12/12/2020   A/P: Diabetes-has been stable. We opted to hold off on a1c repeat at next visit- from a palliative approach- minimize sticks   #hyperlipidemia. LDL under 70 goal S: Medication:Simvastatin 40 mg Lab Results  Component Value Date   CHOL 134 05/30/2020   HDL 62 05/30/2020   LDLCALC 56 05/30/2020   LDLDIRECT 61.0 01/03/2015   TRIG 78 05/30/2020   CHOLHDL 2.2 05/30/2020   A/P: has bene well controlled- continue current medications  #hypertension S: medication: off all meds -off losartan 25 mg due to weight loss Prior Amlodipine 5 mg Home readings #s: 118/69 at home this AM BP Readings from Last 3 Encounters:  03/20/21 122/72  03/04/21 117/71  12/12/20 118/66  A/P: good control on repeat- continue to monitor  #Insomnia- control on Ambien 5 mg in the past- not sleeping as well recently. Sleeping 5-6 hours and then waking up and a lot of daytime sleepiness. Son asks about increasing to 10 mg- would like to avoid in particular with fall risk- actually talked about potentially stopping medicine. No recent falls-but we discussed the risk  #PACs-has been an issue her "whole life". PACs on prior EKGs- HR high normal on my exam- did consider repeat EKG and if running this high next visit may repeat   #for quality of life- Holding off with covid shot   Recommended follow up: Return in about 3 months (around 06/20/2021) for follow up- or sooner if needed. physical if we have slot available. . Future Appointments  Date Time Provider [place]  04/24/2021 12:30 PM [place],  Arnoldo Morale, NP [facility] None  06/26/2021  9:20 AM Marin Olp, MD [facility]  02/05/2022  3:15 PM [facility] HEALTH COACH [facility]    Lab/Order associations:   ICD-10-CM   1. Hypertension associated with diabetes ([place])  E11.59    I15.2   2. Type 2 diabetes mellitus with retinopathy, without long-term current use of insulin, macular edema presence unspecified, unspecified laterality, unspecified retinopathy severity ([place])  E11.319   3. Hyperlipidemia associated with type 2 diabetes mellitus ([place])  E11.69    E78.5   4. Squamous cell carcinoma, nonkeratinizing  C44.92 Ambulatory referral to Home Health  5. General weakness  R53.1 Ambulatory referral to [place]  6. Fall, initial encounter  W19.XXXA Ambulatory referral to [place]    No orders of the defined types were placed in this encounter.   Time Spent: 30 minutes of total time (11:27 AM- 11:57 AM) was spent on the date of the encounter performing the  following actions: chart review prior to seeing the patient, obtaining history, performing a medically necessary exam, counseling on the treatment plan, placing orders, and documenting in our EHR.   Return precautions advised.  Garret Reddish, MD

## 2021-03-20 ENCOUNTER — Ambulatory Visit (INDEPENDENT_AMBULATORY_CARE_PROVIDER_SITE_OTHER): Payer: Medicare HMO | Admitting: Family Medicine

## 2021-03-20 ENCOUNTER — Other Ambulatory Visit: Payer: Self-pay

## 2021-03-20 ENCOUNTER — Encounter: Payer: Self-pay | Admitting: Family Medicine

## 2021-03-20 VITALS — BP 122/72 | HR 104 | Temp 98.2°F | Ht 61.0 in | Wt 106.5 lb

## 2021-03-20 DIAGNOSIS — I152 Hypertension secondary to endocrine disorders: Secondary | ICD-10-CM | POA: Diagnosis not present

## 2021-03-20 DIAGNOSIS — E785 Hyperlipidemia, unspecified: Secondary | ICD-10-CM | POA: Diagnosis not present

## 2021-03-20 DIAGNOSIS — E1169 Type 2 diabetes mellitus with other specified complication: Secondary | ICD-10-CM

## 2021-03-20 DIAGNOSIS — N183 Chronic kidney disease, stage 3 unspecified: Secondary | ICD-10-CM

## 2021-03-20 DIAGNOSIS — R531 Weakness: Secondary | ICD-10-CM | POA: Diagnosis not present

## 2021-03-20 DIAGNOSIS — E1159 Type 2 diabetes mellitus with other circulatory complications: Secondary | ICD-10-CM | POA: Diagnosis not present

## 2021-03-20 DIAGNOSIS — W19XXXA Unspecified fall, initial encounter: Secondary | ICD-10-CM

## 2021-03-20 DIAGNOSIS — C4492 Squamous cell carcinoma of skin, unspecified: Secondary | ICD-10-CM

## 2021-03-20 DIAGNOSIS — E11319 Type 2 diabetes mellitus with unspecified diabetic retinopathy without macular edema: Secondary | ICD-10-CM | POA: Diagnosis not present

## 2021-03-28 ENCOUNTER — Emergency Department (HOSPITAL_COMMUNITY): Payer: Medicare HMO

## 2021-03-28 ENCOUNTER — Inpatient Hospital Stay (HOSPITAL_COMMUNITY)
Admission: EM | Admit: 2021-03-28 | Discharge: 2021-03-29 | DRG: 148 | Disposition: A | Discharge status: Hospice | Payer: Medicare HMO | Attending: Internal Medicine | Admitting: Internal Medicine

## 2021-03-28 ENCOUNTER — Encounter (HOSPITAL_COMMUNITY): Payer: Self-pay | Admitting: Internal Medicine

## 2021-03-28 DIAGNOSIS — Z515 Encounter for palliative care: Secondary | ICD-10-CM | POA: Diagnosis not present

## 2021-03-28 DIAGNOSIS — Z7189 Other specified counseling: Secondary | ICD-10-CM

## 2021-03-28 DIAGNOSIS — N1831 Chronic kidney disease, stage 3a: Secondary | ICD-10-CM | POA: Diagnosis present

## 2021-03-28 DIAGNOSIS — D496 Neoplasm of unspecified behavior of brain: Secondary | ICD-10-CM | POA: Diagnosis not present

## 2021-03-28 DIAGNOSIS — E11319 Type 2 diabetes mellitus with unspecified diabetic retinopathy without macular edema: Secondary | ICD-10-CM | POA: Diagnosis not present

## 2021-03-28 DIAGNOSIS — R296 Repeated falls: Secondary | ICD-10-CM | POA: Diagnosis not present

## 2021-03-28 DIAGNOSIS — Z9109 Other allergy status, other than to drugs and biological substances: Secondary | ICD-10-CM

## 2021-03-28 DIAGNOSIS — G40901 Epilepsy, unspecified, not intractable, with status epilepticus: Secondary | ICD-10-CM | POA: Diagnosis present

## 2021-03-28 DIAGNOSIS — Z85840 Personal history of malignant neoplasm of eye: Secondary | ICD-10-CM

## 2021-03-28 DIAGNOSIS — Z7984 Long term (current) use of oral hypoglycemic drugs: Secondary | ICD-10-CM

## 2021-03-28 DIAGNOSIS — Z20822 Contact with and (suspected) exposure to covid-19: Secondary | ICD-10-CM | POA: Diagnosis not present

## 2021-03-28 DIAGNOSIS — R54 Age-related physical debility: Secondary | ICD-10-CM | POA: Diagnosis present

## 2021-03-28 DIAGNOSIS — E785 Hyperlipidemia, unspecified: Secondary | ICD-10-CM | POA: Diagnosis present

## 2021-03-28 DIAGNOSIS — H548 Legal blindness, as defined in USA: Secondary | ICD-10-CM | POA: Diagnosis not present

## 2021-03-28 DIAGNOSIS — Z66 Do not resuscitate: Secondary | ICD-10-CM | POA: Diagnosis present

## 2021-03-28 DIAGNOSIS — R569 Unspecified convulsions: Secondary | ICD-10-CM | POA: Diagnosis not present

## 2021-03-28 DIAGNOSIS — C300 Malignant neoplasm of nasal cavity: Secondary | ICD-10-CM | POA: Diagnosis not present

## 2021-03-28 DIAGNOSIS — Z800 Family history of malignant neoplasm of digestive organs: Secondary | ICD-10-CM | POA: Diagnosis not present

## 2021-03-28 DIAGNOSIS — Z79899 Other long term (current) drug therapy: Secondary | ICD-10-CM

## 2021-03-28 DIAGNOSIS — Z8249 Family history of ischemic heart disease and other diseases of the circulatory system: Secondary | ICD-10-CM | POA: Diagnosis not present

## 2021-03-28 DIAGNOSIS — Z91048 Other nonmedicinal substance allergy status: Secondary | ICD-10-CM

## 2021-03-28 DIAGNOSIS — F32A Depression, unspecified: Secondary | ICD-10-CM | POA: Diagnosis not present

## 2021-03-28 DIAGNOSIS — I129 Hypertensive chronic kidney disease with stage 1 through stage 4 chronic kidney disease, or unspecified chronic kidney disease: Secondary | ICD-10-CM | POA: Diagnosis present

## 2021-03-28 DIAGNOSIS — E1169 Type 2 diabetes mellitus with other specified complication: Secondary | ICD-10-CM | POA: Diagnosis present

## 2021-03-28 DIAGNOSIS — E1122 Type 2 diabetes mellitus with diabetic chronic kidney disease: Secondary | ICD-10-CM | POA: Diagnosis not present

## 2021-03-28 DIAGNOSIS — I10 Essential (primary) hypertension: Secondary | ICD-10-CM | POA: Insufficient documentation

## 2021-03-28 DIAGNOSIS — N183 Chronic kidney disease, stage 3 unspecified: Secondary | ICD-10-CM | POA: Diagnosis present

## 2021-03-28 DIAGNOSIS — Z9849 Cataract extraction status, unspecified eye: Secondary | ICD-10-CM

## 2021-03-28 DIAGNOSIS — Z885 Allergy status to narcotic agent status: Secondary | ICD-10-CM | POA: Diagnosis not present

## 2021-03-28 DIAGNOSIS — R000 Tachycardia, unspecified: Secondary | ICD-10-CM | POA: Diagnosis not present

## 2021-03-28 HISTORY — DX: Malignant neoplasm of nasal cavity: C30.0

## 2021-03-28 LAB — URINALYSIS, ROUTINE W REFLEX MICROSCOPIC
Bilirubin Urine: NEGATIVE
Glucose, UA: >=500 mg/dL — AB
Hgb urine dipstick: NEGATIVE
Ketones, ur: 5 mg/dL — AB
Nitrite: NEGATIVE
Protein, ur: 30 mg/dL — AB
Specific Gravity, Urine: 1.014 (ref 1.005–1.030)
pH: 5 (ref 5.0–8.0)

## 2021-03-28 LAB — COMPREHENSIVE METABOLIC PANEL
ALT: 9 U/L (ref 0–44)
AST: 23 U/L (ref 15–41)
Albumin: 2.9 g/dL — ABNORMAL LOW (ref 3.5–5.0)
Alkaline Phosphatase: 135 U/L — ABNORMAL HIGH (ref 38–126)
Anion gap: 21 — ABNORMAL HIGH (ref 5–15)
BUN: 15 mg/dL (ref 8–23)
CO2: 15 mmol/L — ABNORMAL LOW (ref 22–32)
Calcium: 8.7 mg/dL — ABNORMAL LOW (ref 8.9–10.3)
Chloride: 100 mmol/L (ref 98–111)
Creatinine, Ser: 1.06 mg/dL — ABNORMAL HIGH (ref 0.44–1.00)
GFR, Estimated: 51 mL/min — ABNORMAL LOW (ref >60)
Glucose, Bld: 312 mg/dL — ABNORMAL HIGH (ref 70–99)
Potassium: 3.7 mmol/L (ref 3.5–5.1)
Sodium: 136 mmol/L (ref 135–145)
Total Bilirubin: 0.2 mg/dL — ABNORMAL LOW (ref 0.3–1.2)
Total Protein: 5.9 g/dL — ABNORMAL LOW (ref 6.5–8.1)

## 2021-03-28 LAB — CBC WITH DIFFERENTIAL/PLATELET
Abs Immature Granulocytes: 0.14 10*3/uL — ABNORMAL HIGH (ref 0.00–0.07)
Basophils Absolute: 0.1 10*3/uL (ref 0.0–0.1)
Basophils Relative: 0 %
Eosinophils Absolute: 0 10*3/uL (ref 0.0–0.5)
Eosinophils Relative: 0 %
HCT: 30.9 % — ABNORMAL LOW (ref 36.0–46.0)
Hemoglobin: 9.5 g/dL — ABNORMAL LOW (ref 12.0–15.0)
Immature Granulocytes: 1 %
Lymphocytes Relative: 8 %
Lymphs Abs: 1.4 10*3/uL (ref 0.7–4.0)
MCH: 32.2 pg (ref 26.0–34.0)
MCHC: 30.7 g/dL (ref 30.0–36.0)
MCV: 104.7 fL — ABNORMAL HIGH (ref 80.0–100.0)
Monocytes Absolute: 0.9 10*3/uL (ref 0.1–1.0)
Monocytes Relative: 5 %
Neutro Abs: 15.3 10*3/uL — ABNORMAL HIGH (ref 1.7–7.7)
Neutrophils Relative %: 86 %
Platelets: 338 10*3/uL (ref 150–400)
RBC: 2.95 MIL/uL — ABNORMAL LOW (ref 3.87–5.11)
RDW: 16.6 % — ABNORMAL HIGH (ref 11.5–15.5)
WBC: 17.8 10*3/uL — ABNORMAL HIGH (ref 4.0–10.5)
nRBC: 0.1 % (ref 0.0–0.2)

## 2021-03-28 LAB — CBG MONITORING, ED: Glucose-Capillary: 276 mg/dL — ABNORMAL HIGH (ref 70–99)

## 2021-03-28 LAB — TROPONIN I (HIGH SENSITIVITY)
Troponin I (High Sensitivity): 30 ng/L — ABNORMAL HIGH (ref <18)
Troponin I (High Sensitivity): 139 ng/L (ref <18)

## 2021-03-28 MED ORDER — HALOPERIDOL LACTATE 2 MG/ML PO CONC
0.5000 mg | ORAL | Status: DC | PRN
Start: 2021-03-28 — End: 2021-03-30
  Filled 2021-03-28: qty 0.3

## 2021-03-28 MED ORDER — LORAZEPAM 2 MG/ML PO CONC: 1.0000 mg | ORAL | Status: DC | PRN

## 2021-03-28 MED ORDER — LORAZEPAM 1 MG PO TABS: 1.0000 mg | ORAL_TABLET | ORAL | Status: DC | PRN

## 2021-03-28 MED ORDER — LORAZEPAM 2 MG/ML IJ SOLN: 1.0000 mg | INTRAMUSCULAR | Status: DC | PRN

## 2021-03-28 MED ORDER — GLYCOPYRROLATE 0.2 MG/ML IJ SOLN: 0.2000 mg | INTRAMUSCULAR | Status: DC | PRN

## 2021-03-28 MED ORDER — MORPHINE SULFATE (PF) 2 MG/ML IV SOLN
2.0000 mg | INTRAVENOUS | Status: AC
  Administered 2021-03-28: 2 mg via INTRAVENOUS
  Filled 2021-03-28: qty 1

## 2021-03-28 MED ORDER — ONDANSETRON 4 MG PO TBDP: 4.0000 mg | ORAL_TABLET | Freq: Four times a day (QID) | ORAL | Status: DC | PRN

## 2021-03-28 MED ORDER — DIPHENHYDRAMINE HCL 50 MG/ML IJ SOLN: 12.5000 mg | INTRAMUSCULAR | Status: DC | PRN

## 2021-03-28 MED ORDER — LEVETIRACETAM IN NACL 1000 MG/100ML IV SOLN
1000.0000 mg | Freq: Once | INTRAVENOUS | Status: AC
  Administered 2021-03-28: 1000 mg via INTRAVENOUS
  Filled 2021-03-28: qty 100

## 2021-03-28 MED ORDER — MORPHINE BOLUS VIA INFUSION
2.0000 mg | INTRAVENOUS | Status: DC | PRN
Start: 2021-03-28 — End: 2021-03-29
  Filled 2021-03-28: qty 2

## 2021-03-28 MED ORDER — HALOPERIDOL 0.5 MG PO TABS
0.5000 mg | ORAL_TABLET | ORAL | Status: DC | PRN
  Filled 2021-03-28: qty 1

## 2021-03-28 MED ORDER — POLYVINYL ALCOHOL 1.4 % OP SOLN
1.0000 [drp] | Freq: Four times a day (QID) | OPHTHALMIC | Status: DC | PRN
  Filled 2021-03-28: qty 15

## 2021-03-28 MED ORDER — HALOPERIDOL LACTATE 5 MG/ML IJ SOLN: 0.5000 mg | INTRAMUSCULAR | Status: DC | PRN

## 2021-03-28 MED ORDER — ONDANSETRON HCL 4 MG/2ML IJ SOLN: 4.0000 mg | Freq: Four times a day (QID) | INTRAMUSCULAR | Status: DC | PRN

## 2021-03-28 MED ORDER — ACETAMINOPHEN 650 MG RE SUPP: 650.0000 mg | Freq: Four times a day (QID) | RECTAL | Status: DC | PRN

## 2021-03-28 MED ORDER — SODIUM CHLORIDE 0.9 % IV BOLUS: 1000.0000 mL | Freq: Once | INTRAVENOUS | Status: DC

## 2021-03-28 MED ORDER — BIOTENE DRY MOUTH MT LIQD: 15.0000 mL | OROMUCOSAL | Status: DC | PRN

## 2021-03-28 MED ORDER — MORPHINE 100MG IN NS 100ML (1MG/ML) PREMIX INFUSION
5.0000 mg/h | INTRAVENOUS | Status: DC
  Administered 2021-03-28 – 2021-03-29 (×2): 5 mg/h via INTRAVENOUS
  Filled 2021-03-28 (×2): qty 100

## 2021-03-28 MED ORDER — ACETAMINOPHEN 325 MG PO TABS: 650.0000 mg | ORAL_TABLET | Freq: Four times a day (QID) | ORAL | Status: DC | PRN

## 2021-03-28 MED ORDER — SODIUM CHLORIDE 0.9 % IV SOLN
250.0000 mg | Freq: Two times a day (BID) | INTRAVENOUS | Status: DC
  Administered 2021-03-29: 250 mg via INTRAVENOUS
  Filled 2021-03-28 (×4): qty 2.5

## 2021-03-28 MED ORDER — LORAZEPAM 2 MG/ML IJ SOLN: 1.0000 mg | INTRAMUSCULAR | Status: DC

## 2021-03-28 MED ORDER — GLYCOPYRROLATE 1 MG PO TABS
1.0000 mg | ORAL_TABLET | ORAL | Status: DC | PRN
  Filled 2021-03-28: qty 1

## 2021-03-28 NOTE — ED Triage Notes (Signed)
Pt BIB [facility] for seizure's.  PT has hx of brain cancer with chemo tx through April of this year. Cancer began in nasal cavity, traveled behind eye and into brain. Pt was given 2.5 of versed en route.  Pt has been seizure-free since versed administration.

## 2021-03-28 NOTE — H&P (Signed)
History and Physical    DEVEN AUDI JAS:[id number] DOB: [birth year] DOA: 03/28/2021  PCP: Marin Olp, MD Consultants:  Ready - oncology Patient coming from:  Home - lives with son and daughter-in-law; NOK: Haelie, [place], [phone]  Chief Complaint: seizures  HPI: Laura Salas is a 87 y.o. 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  Her son reports that she moved in with them in 2005.  She was legally blind for several years.  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  She did 2 rounds of Keytruda without response and then changed to an oral chemotherapy.  She stopped taking this in April and has not had any further treatment since.  She lost total vision in her L eye and they feel fairly sure that she cannot see out of the R either.  She has had recent falls and has been failing at home.  She has said multiple times that she doesn't understand why God has not called her home.  Her speech has been failing too.  Her quality of life has been quite poor.  This AM, she ate grits for breakfast but her DIL heard a loud noise and thought she must have fallen again.  She was walking with her walker and ran into the [place] cabinet but did not fall.  She seemed unable to respond or move her feet.  She eventually got to the bedroom and fell asleep.  However, she subsequently developed obvious GTC seizure activity and they called 911.  They report at least 3 seizures at home.  She has not been responsive since her arrival in the ER.  She periodically moves her L hand to scratch her mouth or touch the [place] O2 but otherwise does not appear to have any purposeful movements.    ED Course:  H/o SCC into her brain, nonresectable.  New seizures this AM, never back to baseline.  Got Versed, no further seizure.  Still  obtunded.  Goals of care - heading toward comfort.  Neurology will consult.  Review of Systems: Unable to perform  Ambulatory Status:  Ambulates with a walker  COVID Vaccine Status:  Complete  Past Medical History:  Diagnosis Date  . Depression   . Diabetes mellitus   . Hyperlipidemia   . Hypertension   . Osteoporosis   . Squamous cell carcinoma of nasal cavity [facility])     Past Surgical History:  Procedure Laterality Date  . CARPAL TUNNEL RELEASE     left  . CATARACT EXTRACTION    . CESAREAN SECTION      Social History   Socioeconomic History  . Marital status: Widowed    Spouse name: Not on file  . Number of children: Not on file  . Years of education: Not on file  . Highest education level: Associate degree: academic program  Occupational History    Comment: Location manager   Tobacco Use  . Smoking status: Never Smoker  . Smokeless tobacco: Never Used  Vaping Use  . Vaping Use: Never used  Substance and Sexual Activity  . Alcohol use: No  . Drug use: No  . Sexual activity: Not on file  Other Topics Concern  . Not on file  Social History Narrative   Grew up in [place], moved to [place] around 2005. Widowed 10 years ago  of "emboli". Only son is a Company secretary at [employer] so moved close for that reason.  2 grown grandchildren (grandson is in [place]; granddaughter in grad school for physical therapy)       Lives in home with son but has separate portion of home. Eats dinner together at time.       Hobbies: shopping, movies, [place] visiting 2 sisters   Social Determinants of Health   Financial Resource Strain: Low Risk   . Difficulty of Paying Living Expenses: Not hard at all  Food Insecurity: No Food Insecurity  . Worried About Charity fundraiser in the Last Year: Never true  . Ran Out of Food in the Last Year: Never true  Transportation Needs: No Transportation Needs  . Lack of Transportation (Medical): No  . Lack of  Transportation (Non-Medical): No  Physical Activity: Inactive  . Days of Exercise per Week: 0 days  . Minutes of Exercise per Session: 0 min  Stress: Stress Concern Present  . Feeling of Stress : To some extent  Social Connections: Moderately Isolated  . Frequency of Communication with Friends and Family: More than three times a week  . Frequency of Social Gatherings with Friends and Family: Twice a week  . Attends Religious Services: 1 to 4 times per year  . Active Member of Clubs or Organizations: No  . Attends Archivist Meetings: Never  . Marital Status: Widowed  Intimate Partner Violence: Not At Risk  . Fear of Current or Ex-Partner: No  . Emotionally Abused: No  . Physically Abused: No  . Sexually Abused: No    Allergies  Allergen Reactions  . Tape Other (See Comments)    THE PATIENT'S SKIN IS VERY, VERY THIN- TEARS ANS PEELS OFF EASILY!!  . Alendronate Sodium Nausea Only and Other (See Comments)    Upset stomach, joint and muscle pain  . Codeine Sulfate Other (See Comments)    Dizziness, "put her to bed"  . Ibandronate Sodium Nausea Only and Other (See Comments)    Upset stomach, joint and muscle pain    Family History  Problem Relation Age of Onset  . Cancer Mother        colon  . Heart attack Father   . Heart attack Brother   . Cancer Brother        stomach  . Other Sister        died at 28    Prior to Admission medications   Medication Sig Start Date End Date Taking? Authorizing Provider  dexamethasone (DECADRON) 1 MG tablet Take 1 mg by mouth daily with breakfast. 12/10/20  Yes [provider]  metFORMIN (GLUCOPHAGE) 850 MG tablet Take 1 tablet (850 mg total) by mouth daily with breakfast. Patient taking differently: Take 850 mg by mouth in the morning and at bedtime. 12/12/20  Yes Marin Olp, MD  prochlorperazine (COMPAZINE) 10 MG tablet Take 10 mg by mouth every 6 (six) hours as needed for nausea. 08/29/20  Yes [provider]  traMADol (ULTRAM) 50 MG tablet Take 50-100 mg by mouth every 4 (four) hours as needed (for pain). 10/15/20  Yes [provider]  Vitamin D, Ergocalciferol, (DRISDOL) 1.25 MG (50000 UNIT) CAPS capsule Take 50,000 Units by mouth every Sunday.   Yes [provider]  zolpidem (AMBIEN) 5 MG tablet Take 1 tablet (5 mg total) by mouth at bedtime as needed. for sleep Patient taking differently: Take 5 mg by mouth at  bedtime. for sleep 11/24/20  Yes Marin Olp, MD  diclofenac sodium (VOLTAREN) 1 % GEL Apply 2 g topically 4 (four) times daily. 08/25/18   Marin Olp, MD  [facility] ULTRA test strip TEST DAILY AS DIRECTED 12/29/20   Marin Olp, MD    Physical Exam: Vitals:   03/28/21 1239 03/28/21 1242 03/28/21 1428  BP: 122/64  (!) 106/54  Pulse: (!) 123  (!) 112  Resp: 20  20  Temp: 97.6 F (36.4 C)    TempSrc: Rectal    SpO2: 100% 90% 100%     . General:  Snoring quietly, sedated  . Eyes:  Normal R iris when manually opened; L eye with significant chronic deformity associated with tumor . ENT:  grossly normal lips . Neck:  no LAD, masses or thyromegaly . Cardiovascular:  Irregularly irregular. No LE edema.  Marland Kitchen Respiratory:   CTA bilaterally with no wheezes/rales/rhonchi.  Normal respiratory effort. . Abdomen:  soft, NT, ND . Skin:  Mild scattered ecchymoses . Musculoskeletal:   no bony abnormality . Psychiatric:  Sedated, unresponsive . Neurologic:  Unable to perform    Radiological Exams on Admission: Independently reviewed - see discussion in A/P where applicable  CT HEAD WO CONTRAST  Result Date: 03/28/2021 CLINICAL DATA:  Seizure EXAM: CT HEAD WITHOUT CONTRAST TECHNIQUE: Contiguous axial images were obtained from the base of the skull through the vertex without intravenous contrast. COMPARISON:  Outside CT orbits, 04/22/2020 FINDINGS: Brain: No evidence of acute infarction, hemorrhage, hydrocephalus, extra-axial collection or mass lesion/mass  effect. Periventricular white matter hypodensity. Vascular: No hyperdense vessel or unexpected calcification. Skull: Normal. Negative for fracture or focal lesion. Sinuses/Orbits: Redemonstrated soft tissue mass centered about the medial left orbit and adjacent ethmoid air cells, with substantial mass effect on the left globe. Although not optimally assessed on this examination of the brain, this appears to be slightly enlarged compared to prior outside examination of the orbits dated 04/22/2020. Other: None. IMPRESSION: 1. No acute intracranial pathology. Small-vessel white matter disease. 2. Redemonstrated soft tissue mass centered about the medial left orbit and adjacent ethmoid air cells, with substantial mass effect on the left globe. Although not optimally assessed on this examination of the brain, this appears to be slightly enlarged compared to prior outside examination of the orbits dated 04/22/2020. 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 Electronically Signed   By: Eddie Candle M.D.   On: 03/28/2021 13:07    EKG: Independently reviewed.  Sinus tachycardia with rate 132; RBBB; nonspecific ST changes    Labs on Admission: I have personally reviewed the available labs and imaging studies at the time of the admission.  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)   Admission for end of life care   -Patient presenting with status epilepticus -She does not have a known h/o seizures but has an invasive SCC of the nasal cavity that has invaded her brain and so she is at  increased risk of seizure activity -She was also taking Tramadol for pain control, and this can decrease the seizure  threshold -She has had significant performance decline since her diagnosis and has suffered recent falls, speech difficulty, and diminished quality of life -Family had already requested palliative care consultation and home health assistance; her initial consult was scheduled for July -After long discussion in the ER, family has decided to proceed with comfort care only -She will be admitted to [facility] for comfort care and palliative care consult -Patient is likely to be a candidate for [facility] or other residential hospice if she does not suffer in-hospital death - her reserve is likely quite low given her age and overall frailty -Comfort care order set utilized -Pain control with morphine drip after discussion with family - she has been in pain for a year now and they do not want her to have ongoing pain -Formal neurology consult may not be needed but will defer this decision to neurology, as well as the decision to continue AEDs     DVT prophylaxis: None - comfort measures Code Status: DNR - confirmed with family Family Communication: Husband and daughter-in-law were present throughout evaluation Disposition Plan: Anticipate in-hospital death vs. Possible transition to residential hospice Consults called: Neurology; palliative care Admission status: Admit - It is my clinical opinion that admission to INPATIENT is reasonable and necessary because of the expectation that this patient will require hospital care that crosses at least 2 midnights to treat this condition based on the medical complexity of the problems presented.  Given the aforementioned information, the predictability of an adverse outcome is felt to be significant.    Karmen Bongo MD [facility]   How to contact the [facility] Attending or Consulting provider [place] or covering provider during after hours  [place], for this patient?  1. Check the care team in [facility] and look for a) attending/consulting TR[facility] provider listed and b) the [facility] team listed 2. Log into [email or web address] and use [facility]'s universal password to access. If you do not have the password, please contact the hospital operator. 3. Locate the [facility] provider you are looking for under [facility] and page to a number that you can be directly reached. 4. If you still have difficulty reaching the provider, please page the [facility] (Director on Call) for the Hospitalists listed on amion for assistance.   03/28/2021, 3:44 PM

## 2021-03-28 NOTE — Consult Note (Signed)
Neurology curbside Reason for Consult: Seizure Requesting Physician: Edison Simon  CC: Seizure  History is obtained from: Chart review and primary team  HPI: Laura Salas is a 87 y.o. 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 Duffy Rhody Ready at [facility].  She first noticed swelling of the left eye in May 2021 as well as changes in visual acuity.  Biopsy on 05/06/2020 revealed nonkeratinizing squamous cell carcinoma, p16 positive.  Due to extension into the brain and surgical resection or definitive radiation for cure was not felt to be possible, and she had progression on pembrolizumab and was therefore switched to capecitabine.  She has been receiving palliative chemotherapy (oral capecitabine, 10-day on 18-day off schedule), on which the swelling of the left orbit has been declining  ECOG Performance Status: Grade 1 : Restricted in physically strenuous activity but ambulatory and able to carry out work of a light or sedentary nature, e.g., light house work, office work per oncology note from 02/18/2021  Reports of prior MRI brains: "05/20/20 Brain/orbits TIW:[id number]: Large nasal cavity malignancy with invasion through the anterior skull base into the anterior cranial fossa and also invasion of the left orbit and extension into the left premaxillary subcutaneous fat. There likely is involvement of both pterygopalatine ganglia and possibly the left vidian nerve. 08/14/20 MRI orbits IMPRESSION: Findings consistent with interval progression of disease as evidenced by enlargement of the sinonasal mass with extension to the left orbit, premaxillary soft tissues, intracranial extension, and worsening perineural spread involving the  pterygopalatine ganglia and suspected involvement of trigeminal nerve roots."  Initially full neurology consult was requested, however on further discussion with admitting hospitalist team family and patient elected to transition to full comfort care measures, therefore full consult was not completed   Past Medical History:  Diagnosis Date   Depression    Diabetes mellitus    Hyperlipidemia    Hypertension    Osteoporosis    Past Surgical History:  Procedure Laterality Date   CARPAL TUNNEL RELEASE     left   CATARACT EXTRACTION     CESAREAN SECTION       Current Facility-Administered Medications:    sodium chloride 0.9 % bolus 1,000 mL, 1,000 mL, Intravenous, Once, Gareth Morgan, MD  Current Outpatient Medications:    dexamethasone (DECADRON) 1 MG tablet, Take 1 mg by mouth daily with breakfast., Disp: , Rfl:    metFORMIN (GLUCOPHAGE) 850 MG tablet, Take 1 tablet (850 mg total) by mouth daily with breakfast. (Patient taking differently: Take 850 mg by mouth in the morning and at bedtime.), Disp: 90 tablet, Rfl: 3   prochlorperazine (COMPAZINE) 10 MG tablet, Take 10 mg by mouth every 6 (six) hours as needed for nausea., Disp: , Rfl:    traMADol (ULTRAM) 50 MG tablet, Take 50-100 mg by mouth every 4 (four) hours as needed (for pain)., Disp: , Rfl:    Vitamin D, Ergocalciferol, (DRISDOL) 1.25 MG (50000 UNIT) CAPS capsule, Take 50,000 Units by mouth every Sunday., Disp: , Rfl:    zolpidem (AMBIEN) 5 MG tablet, Take 1 tablet (5 mg total) by mouth at bedtime as needed. for sleep (Patient taking differently: Take 5 mg by mouth at bedtime. for sleep), Disp: 31 tablet, Rfl: 5   diclofenac sodium (VOLTAREN)  1 % GEL, Apply 2 g topically 4 (four) times daily., Disp: 100 g, Rfl: 5   ONETOUCH ULTRA test strip, TEST DAILY AS DIRECTED, Disp: 100 strip, Rfl: 3   Family History  Problem Relation Age of Onset   Cancer Mother        colon   Heart attack Father    Heart attack Brother     Cancer Brother        stomach   Other Sister        died at 61   Social History:  reports that she has never smoked. She has never used smokeless tobacco. She reports that she does not drink alcohol and does not use drugs.  Exam: Current vital signs: BP 122/64 (BP Location: Right Arm)   Pulse (!) 123   Temp 97.6 F (36.4 C) (Rectal)   Resp 20   SpO2 90%  Vital signs in last 24 hours: Temp:  [97.6 F (36.4 C)] 97.6 F (36.4 C) (06/04 1239) Pulse Rate:  [123] 123 (06/04 1239) Resp:  [20] 20 (06/04 1239) BP: (122)/(64) 122/64 (06/04 1239) SpO2:  [90 %-100 %] 90 % (06/04 1242)  I have reviewed labs in epic and the results pertinent to this consultation are: Urine analysis notable for trace leukocytes and rare bacteria, elevated protein and glucose CBC demonstrates leukocytosis to 17.8, anemia to 9.5, increased MCV at 104.7 increased RDW at 16.6 with normal platelet count CMP notable for glucose of 312, creatinine of 1.06 but GFR of 51, bicarb of 15 with an anion gap of 21   I have reviewed the images obtained:  Head CT demonstrates left globe mass extending into the brain without acute intracranial process   Impression: Even in the setting of full comfort care measures, seizures significantly affect quality of life and should be treated.  Therefore recommend Keppra as below (   Recommendations: -Received a 500 mg Keppra load in the ED, recommend 250 mg twice daily for maintenance going forward based on creatinine clearance Estimated Creatinine Clearance: 28.2 mL/min (A) (by C-G formula based on SCr of 1.06 mg/dL (H)).   CrCl 80 to 130 mL/minute/1.73 m2: 500 mg to 1.5 g every 12 hours.  CrCl 50 to <80 mL/minute/1.73 m2: 500 mg to 1 g every 12 hours.  CrCl 30 to <50 mL/minute/1.73 m2: 250 to 750 mg every 12 hours.  CrCl 15 to <30 mL/minute/1.73 m2: 250 to 500 mg every 12 hours.  CrCl <15 mL/minute/1.73 m2: 250 to 500 mg every 24 hours (expert opinion). -Given tramadol can  lower seizure threshold recommend alternative opiate agents for pain control such as oxycodone, hydrocodone, or morphine   Lesleigh Noe MD-PhD [facility] [phone] Available 7 AM to 7 PM, outside these hours please contact Neurologist on call listed on AMION  Discussed with Dr. Karmen Bongo via secure chat

## 2021-03-28 NOTE — ED Notes (Signed)
Monitor on comfort care per palliative NP.Marland Kitchen

## 2021-03-28 NOTE — Consult Note (Signed)
Consultation Note Date: 03/28/2021   Patient Name: Laura Salas  DOB: November 02, 1930  MRN: [id number]  Age / Sex: 87 y.o., female  PCP: Marin Olp, MD Referring Physician: Gareth Morgan, MD  Reason for Consultation: Establishing goals of care and Terminal Care  HPI/Patient Profile: 87 y.o. 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 CT of head shows possible enlarging and mass effect on the left globe.  She has been unresponsive since arrival to the ED. Per discussion with attending team decision was made to transition to comfort measures only. Palliative medicine consulted for assistance with end of life care,.   Clinical Assessment and Goals of Care: Met with patient son, Jeneen Rinks and his spouse.  They shared that patient has had a difficult journey.  Their primary goal of care is that patient will continue to rest peacefully without symptoms until she dies. They shared how their father had colon cancer- but went to bed one evening and didn't wake from there. They are hopeful that the rest of Hiilani's time on this earth can be as peaceful. They have researched and are interested in taking patient to [facility].  There are no spiritual needs. Patient's son is a Theme park manager.     Primary Decision Maker NEXT OF KIN- son- Jeneen Rinks    SUMMARY OF RECOMMENDATIONS -Agree with current comfort measures only -TOC referral for [facility]- will need to evaluate her stability to transfer on a daily basis -Would continue Keppra while admitted for comfort to prevent seizures-will also schedule lorazepam for seizure prophylaxis -D/C nasal cannula- patient has been pulling at it and does not wear oxygen at baseline -D/C cardiac monitoring     Code Status/Advance Care Planning:  DNR   Palliative prognosis:   Hours - Days  Discharge Planning: Hospice facility vs hospital death  Palliative Assessment/Data: PPS: 10%     Thank you for this consult. Palliative medicine will continue to follow and assist as needed.   Time In: 1626 Time Out: 1738 Time Total: 72 minutes Greater than 50%  of this time was spent counseling and coordinating care related to the above assessment and plan.  Signed by: Mariana Kaufman, AGNP-C Palliative Medicine    Please contact Palliative Medicine Team phone at [phone] for questions and concerns.  For individual provider: See Shea Evans

## 2021-03-28 NOTE — ED Provider Notes (Signed)
[facility] EMERGENCY DEPARTMENT Provider Note   CSN: [id number] Arrival date & time: 03/28/21  1206     History Chief complaint: seizures  Laura Salas is a 87 y.o. 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 No prior seizures. Per family, she was not acting out of the ordinary earlier this morning, then around 10AM started having convulsive seizures. This stopped after a few minutes, but recurred intermittently without return to mental baseline for 30-40 minutes until EMS arrived and administered 2.5mg  of Versed. Family denies recent fever, chills, headache out of the usual. She has had declining cognitive function with her tumor, sometimes speaks incoherently but can follow commands and feed herself. They have palliative care evaluation scheduled in July. The patient has expressed that she does not want to linger, family is in agreement, son Jeneen Rinks is the [place]. She is DNR/DNI.    Past Medical History:  Diagnosis Date  . Depression   . Diabetes mellitus   . Hyperlipidemia   . Hypertension   . Osteoporosis     Patient Active Problem List   Diagnosis Date Noted  . Squamous cell carcinoma, nonkeratinizing 05/30/2020  . Osteoarthritis of both knees 02/17/2018  . Diabetic retinopathy ([place]) 07/11/2015  . PAC (premature atrial contraction) 07/11/2015  . CKD (chronic kidney disease), stage III ([place]) 07/05/2014  . Osteoporosis 12/12/2009  . INTENTION TREMOR 10/11/2008  . History of depression 06/14/2008  . Insomnia 06/14/2008  . ANEMIA, OTHER UNSPEC 01/03/2008  . Diabetes Type II with retinopathy 05/15/2007  . Hyperlipidemia associated with type 2 diabetes mellitus ([place]) 05/15/2007  . Hypertension associated with diabetes ([place]) 05/15/2007   Past Surgical History:  Procedure Laterality Date  . CARPAL TUNNEL RELEASE     left  . CATARACT  EXTRACTION    . CESAREAN SECTION       OB History   No obstetric history on file.     Family History  Problem Relation Age of Onset  . Cancer Mother        colon  . Heart attack Father   . Heart attack Brother   . Cancer Brother        stomach  . Other Sister        died at 3    Social History   Tobacco Use  . Smoking status: Never Smoker  . Smokeless tobacco: Never Used  Vaping Use  . Vaping Use: Never used  Substance Use Topics  . Alcohol use: No  . Drug use: No    Home Medications Prior to Admission medications   Medication Sig Start Date End Date Taking? Authorizing Provider  dexamethasone (DECADRON) 1 MG tablet Take 1 mg by mouth daily with breakfast. 12/10/20  Yes [provider]  metFORMIN (GLUCOPHAGE) 850 MG tablet Take 1 tablet (850 mg total) by mouth daily with breakfast. Patient taking differently: Take 850 mg by mouth in the morning and at bedtime. 12/12/20  Yes Marin Olp, MD  prochlorperazine (COMPAZINE) 10 MG tablet Take 10 mg by mouth every 6 (six) hours as needed for nausea. 08/29/20  Yes [provider]  traMADol (ULTRAM) 50 MG tablet Take 50-100 mg by mouth every 4 (four) hours as needed (for pain). 10/15/20  Yes [provider]  Vitamin D, Ergocalciferol, (DRISDOL) 1.25 MG (50000 UNIT) CAPS capsule Take 50,000 Units by mouth every Sunday.   Yes [provider]  zolpidem (AMBIEN) 5 MG tablet Take 1 tablet (5 mg total) by mouth at bedtime as needed. for sleep Patient taking differently: Take 5 mg by mouth at bedtime. for sleep 11/24/20  Yes Marin Olp, MD  diclofenac sodium (VOLTAREN) 1 % GEL Apply 2 g topically 4 (four) times daily. 08/25/18   Marin Olp, MD  [facility] ULTRA test strip TEST DAILY AS DIRECTED 12/29/20   Marin Olp, MD     Allergies    Tape, Alendronate sodium, Codeine sulfate, and Ibandronate sodium  Review of Systems   Review of Systems  Unable to perform ROS: Patient  nonverbal  Constitutional: Negative for chills and fever.  Neurological: Positive for seizures. Negative for headaches.    Physical Exam Updated Vital Signs BP 122/64 (BP Location: Right Arm)   Pulse (!) 123   Temp 97.6 F (36.4 C) (Rectal)   Resp 20   SpO2 90%   Physical Exam Vitals and nursing note reviewed.  Constitutional:      Comments: Unresponsive to verbal or painful stimuli  HENT:     Head: Normocephalic and atraumatic.     Right Ear: External ear normal.     Left Ear: External ear normal.     Nose: Nose normal.  Eyes:     Comments: Swelling of L eye in setting of squamous cell of L eye  Cardiovascular:     Rate and Rhythm: Tachycardia present. Rhythm irregular.     Heart sounds: Normal heart sounds.  Pulmonary:     Effort: Pulmonary effort is normal.     Breath sounds: Normal breath sounds.  Abdominal:     General: Abdomen is flat.     Palpations: Abdomen is soft.  Musculoskeletal:        General: No deformity.     Cervical back: No rigidity.  Skin:    General: Skin is warm and dry.  Neurological:     Comments: No active convulsions Unable to follow any commands     ED Results / Procedures / Treatments   Labs (all labs ordered are listed, but only abnormal results are displayed) Labs Reviewed  COMPREHENSIVE METABOLIC PANEL - Abnormal; Notable for the following components:      Result Value   CO2 15 (*)    Glucose, Bld 312 (*)    Creatinine, Ser 1.06 (*)    Calcium 8.7 (*)    Total Protein 5.9 (*)    Albumin 2.9 (*)    Alkaline Phosphatase 135 (*)    Total Bilirubin 0.2 (*)    GFR, Estimated 51 (*)    Anion gap 21 (*)    All other components within normal limits  CBC WITH DIFFERENTIAL/PLATELET - Abnormal; Notable for the following components:   WBC 17.8 (*)    RBC 2.95 (*)    Hemoglobin 9.5 (*)    HCT 30.9 (*)    MCV 104.7 (*)    RDW 16.6 (*)    Neutro Abs 15.3 (*)    Abs Immature Granulocytes 0.14 (*)    All other components within  normal limits  URINALYSIS, ROUTINE W REFLEX MICROSCOPIC - Abnormal; Notable for the following components:   Glucose, UA >=500 (*)    Ketones, ur 5 (*)    Protein, ur 30 (*)    Leukocytes,Ua TRACE (*)    Bacteria, UA RARE (*)    All other components within normal limits  CBG MONITORING, ED - Abnormal; Notable for the following  components:   Glucose-Capillary 276 (*)    All other components within normal limits  TROPONIN I (HIGH SENSITIVITY) - Abnormal; Notable for the following components:   Troponin I (High Sensitivity) 30 (*)    All other components within normal limits  TROPONIN I (HIGH SENSITIVITY)    EKG EKG Interpretation  Date/Time:  Saturday March 28 2021 12:22:50 EDT Ventricular Rate:  132 PR Interval:  151 QRS Duration: 117 QT Interval:  323 QTC Calculation: 479 R Axis:   240 Text Interpretation: Sinus tachycardia with irregular rate Right bundle branch block Anterolateral infarct, old PACs Sinus arrythmia Since prior ECG, rate has increased Confirmed by Gareth Morgan [phone]) on 03/28/2021 12:54:36 PM   Radiology CT HEAD WO CONTRAST  Result Date: 03/28/2021 CLINICAL DATA:  Seizure EXAM: CT HEAD WITHOUT CONTRAST TECHNIQUE: Contiguous axial images were obtained from the base of the skull through the vertex without intravenous contrast. COMPARISON:  Outside CT orbits, 04/22/2020 FINDINGS: Brain: No evidence of acute infarction, hemorrhage, hydrocephalus, extra-axial collection or mass lesion/mass effect. Periventricular white matter hypodensity. Vascular: No hyperdense vessel or unexpected calcification. Skull: Normal. Negative for fracture or focal lesion. Sinuses/Orbits: Redemonstrated soft tissue mass centered about the medial left orbit and adjacent ethmoid air cells, with substantial mass effect on the left globe. Although not optimally assessed on this examination of the brain, this appears to be slightly enlarged compared to prior outside examination of the orbits dated  04/22/2020. Other: None. IMPRESSION: 1. No acute intracranial pathology. Small-vessel white matter disease. 2. Redemonstrated soft tissue mass centered about the medial left orbit and adjacent ethmoid air cells, with substantial mass effect on the left globe. Although not optimally assessed on this examination of the brain, this appears to be slightly enlarged compared to prior outside examination of the orbits dated 04/22/2020. 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 Electronically Signed   By: Eddie Candle M.D.   On: 03/28/2021 13:07    Procedures Procedures   Medications Ordered in ED Medications  sodium chloride 0.9 % bolus 1,000 mL (has no administration in time range)  levETIRAcetam (KEPPRA) IVPB 1000 mg/100 mL premix (0 mg Intravenous Stopped 03/28/21 1411)    ED Course  I have reviewed the triage vital signs and the nursing notes.  Pertinent labs & imaging results that were available during my care of the patient were reviewed by me and considered in my medical decision making (see chart for details).    MDM Rules/Calculators/A&P                          Patient presents obtunded after report consistent with status epilepticus this morning, in the setting of terminal squamous cell carcinoma with spread to the brain. Not hypoglycemic. EMS administered 2.5mg  of Versed, convulsions have stopped. She remained obtunded until around 2pm, now localizes to pain. Ongoing encephalopathy could be due to postictal state vs Versed-induced vs subclinical seizures.   Discussed goals of care with family. Her son is the HCPOA. They have been dealing with this cancer for about a year now, and they patient has stated that she does not want to suffer or to linger. She is DNR/DNI. They would like whatever interventions would make her comfortable. Agreeable to further workup for seizures and treatments within reason, but would not want invasive procedures.  CT scan without active bleeding or  other acute findings. Demonstrates the tumor which is slightly larger compared to prior. WBC of 17.8, macrocytic anemia.  Neurology consulted, pending recommendations.  Spoke with hospitalist who will evaluate the patient for admission.   Final Clinical Impression(s) / ED Diagnoses Final diagnoses:  Brain tumor [facility])  Status epilepticus (HCC)      Andrew Au, MD 03/28/21 1441    Gareth Morgan, MD 03/28/21 [phone]

## 2021-03-29 DIAGNOSIS — E11319 Type 2 diabetes mellitus with unspecified diabetic retinopathy without macular edema: Secondary | ICD-10-CM | POA: Diagnosis not present

## 2021-03-29 DIAGNOSIS — E785 Hyperlipidemia, unspecified: Secondary | ICD-10-CM | POA: Diagnosis present

## 2021-03-29 DIAGNOSIS — E1169 Type 2 diabetes mellitus with other specified complication: Secondary | ICD-10-CM | POA: Diagnosis present

## 2021-03-29 DIAGNOSIS — I129 Hypertensive chronic kidney disease with stage 1 through stage 4 chronic kidney disease, or unspecified chronic kidney disease: Secondary | ICD-10-CM | POA: Diagnosis not present

## 2021-03-29 DIAGNOSIS — Z7984 Long term (current) use of oral hypoglycemic drugs: Secondary | ICD-10-CM | POA: Diagnosis not present

## 2021-03-29 DIAGNOSIS — G40901 Epilepsy, unspecified, not intractable, with status epilepticus: Secondary | ICD-10-CM | POA: Diagnosis not present

## 2021-03-29 DIAGNOSIS — E1122 Type 2 diabetes mellitus with diabetic chronic kidney disease: Secondary | ICD-10-CM | POA: Diagnosis not present

## 2021-03-29 DIAGNOSIS — F32A Depression, unspecified: Secondary | ICD-10-CM | POA: Diagnosis present

## 2021-03-29 DIAGNOSIS — Z9849 Cataract extraction status, unspecified eye: Secondary | ICD-10-CM | POA: Diagnosis not present

## 2021-03-29 DIAGNOSIS — Z66 Do not resuscitate: Secondary | ICD-10-CM | POA: Diagnosis not present

## 2021-03-29 DIAGNOSIS — D496 Neoplasm of unspecified behavior of brain: Secondary | ICD-10-CM | POA: Diagnosis not present

## 2021-03-29 DIAGNOSIS — Z91048 Other nonmedicinal substance allergy status: Secondary | ICD-10-CM | POA: Diagnosis not present

## 2021-03-29 DIAGNOSIS — N1831 Chronic kidney disease, stage 3a: Secondary | ICD-10-CM | POA: Diagnosis present

## 2021-03-29 DIAGNOSIS — Z515 Encounter for palliative care: Secondary | ICD-10-CM | POA: Diagnosis not present

## 2021-03-29 DIAGNOSIS — C300 Malignant neoplasm of nasal cavity: Secondary | ICD-10-CM | POA: Diagnosis not present

## 2021-03-29 DIAGNOSIS — Z800 Family history of malignant neoplasm of digestive organs: Secondary | ICD-10-CM | POA: Diagnosis not present

## 2021-03-29 DIAGNOSIS — Z85840 Personal history of malignant neoplasm of eye: Secondary | ICD-10-CM | POA: Diagnosis not present

## 2021-03-29 DIAGNOSIS — Z20822 Contact with and (suspected) exposure to covid-19: Secondary | ICD-10-CM | POA: Diagnosis not present

## 2021-03-29 DIAGNOSIS — H548 Legal blindness, as defined in USA: Secondary | ICD-10-CM | POA: Diagnosis present

## 2021-03-29 DIAGNOSIS — Z79899 Other long term (current) drug therapy: Secondary | ICD-10-CM | POA: Diagnosis not present

## 2021-03-29 DIAGNOSIS — Z9109 Other allergy status, other than to drugs and biological substances: Secondary | ICD-10-CM | POA: Diagnosis not present

## 2021-03-29 DIAGNOSIS — N183 Chronic kidney disease, stage 3 unspecified: Secondary | ICD-10-CM | POA: Diagnosis not present

## 2021-03-29 DIAGNOSIS — R569 Unspecified convulsions: Secondary | ICD-10-CM | POA: Diagnosis not present

## 2021-03-29 DIAGNOSIS — R000 Tachycardia, unspecified: Secondary | ICD-10-CM | POA: Diagnosis not present

## 2021-03-29 DIAGNOSIS — Z8249 Family history of ischemic heart disease and other diseases of the circulatory system: Secondary | ICD-10-CM | POA: Diagnosis not present

## 2021-03-29 DIAGNOSIS — R296 Repeated falls: Secondary | ICD-10-CM | POA: Diagnosis present

## 2021-03-29 DIAGNOSIS — Z885 Allergy status to narcotic agent status: Secondary | ICD-10-CM | POA: Diagnosis not present

## 2021-03-29 LAB — SARS CORONAVIRUS 2 (TAT 6-24 HRS): SARS Coronavirus 2: NEGATIVE

## 2021-03-29 MED ORDER — POLYVINYL ALCOHOL 1.4 % OP SOLN: 1.0000 [drp] | Freq: Four times a day (QID) | OPHTHALMIC | 0 refills | Status: AC | PRN

## 2021-03-29 MED ORDER — HYDROMORPHONE BOLUS VIA INFUSION
0.2000 mg | INTRAVENOUS | Status: DC | PRN
  Filled 2021-03-29: qty 1

## 2021-03-29 MED ORDER — SODIUM CHLORIDE 0.9 % IV SOLN: 250.0000 mg | Freq: Two times a day (BID) | INTRAVENOUS | Status: AC

## 2021-03-29 MED ORDER — HALOPERIDOL LACTATE 2 MG/ML PO CONC: 1.0000 mg | ORAL | 0 refills | Status: AC | PRN

## 2021-03-29 MED ORDER — SODIUM CHLORIDE 0.9 % IV SOLN: 0.5000 mg/h | INTRAVENOUS | 0 refills | Status: AC

## 2021-03-29 MED ORDER — SODIUM CHLORIDE 0.9 % IV SOLN
0.5000 mg/h | INTRAVENOUS | Status: DC
  Administered 2021-03-29: 0.5 mg/h via INTRAVENOUS
  Filled 2021-03-29: qty 2.5

## 2021-03-29 MED ORDER — LORAZEPAM 2 MG/ML PO CONC: 1.0000 mg | ORAL | 0 refills | Status: AC | PRN

## 2021-03-29 MED ORDER — GLYCOPYRROLATE 1 MG PO TABS: 1.0000 mg | ORAL_TABLET | ORAL | Status: AC | PRN

## 2021-03-29 NOTE — ED Notes (Signed)
This RN called lab and confirmed covid test is in process.

## 2021-03-29 NOTE — TOC Initial Note (Signed)
Transition of Care [facility]) - Initial/Assessment Note    Patient Details  Name: Laura Salas MRN: [id number] Date of Birth: [birth year]  [facility]) CM/SW Contact:    Verdell Carmine, RN Phone Number: 03/29/2021, 9:27 AM  Clinical Narrative:                 Reviewed notes and consult. [facility] notified, Haynes Dage, will follow up with son about needs, wishes and [employer].         Patient Goals and CMS Choice    [place], Comfort Care    Expected Discharge Plan and Services                                                Prior Living Arrangements/Services                       Activities of Daily Living      Permission Sought/Granted                  Emotional Assessment              Admission diagnosis:  Squamous cell carcinoma of nasal cavity ([place]) [C30.0] Patient Active Problem List   Diagnosis Date Noted  . Status epilepticus ([place]) 03/28/2021  . Admission for end of life care 03/28/2021  . Squamous cell carcinoma of nasal cavity ([place])   . Hypertension   . Hyperlipidemia   . Squamous cell carcinoma, nonkeratinizing 05/30/2020  . Osteoarthritis of both knees 02/17/2018  . Diabetic retinopathy ([place]) 07/11/2015  . PAC (premature atrial contraction) 07/11/2015  . CKD (chronic kidney disease), stage III ([place]) 07/05/2014  . Osteoporosis 12/12/2009  . INTENTION TREMOR 10/11/2008  . History of depression 06/14/2008  . Insomnia 06/14/2008  . ANEMIA, OTHER UNSPEC 01/03/2008  . Diabetes Type II with retinopathy 05/15/2007  . Hyperlipidemia associated with type 2 diabetes mellitus ([place]) 05/15/2007  . Hypertension associated with diabetes ([place]) 05/15/2007   PCP:  Marin Olp, MD Pharmacy:   [facility], [place] [address] Phone: [phone] Fax: [phone]  [id number] - [place], [place]. AT [address] [place] [place] [address] Phone: [phone] Fax: [phone]  [place], [place] [address] Phone: [phone] Fax: [phone]     Social Determinants of Health (SDOH) Interventions    Readmission Risk Interventions No flowsheet data found.

## 2021-03-29 NOTE — ED Notes (Signed)
The pts son remains at  The bedside

## 2021-03-29 NOTE — Discharge Summary (Signed)
Physician Discharge Summary   Patient ID: Laura Salas MRN: [id number] DOB/AGE: 85-Mar-1932 85 y.o.  Admit date: 03/28/2021 Discharge date: 03/29/2021  Primary Care Physician:  Marin Olp, MD   Recommendations for Outpatient Follow-up:  1. Comfort care  Home Health: None Equipment/Devices:   Discharge Condition: Guarded CODE STATUS: DNR Diet recommendation: Comfort   Discharge Diagnoses:    . Status epilepticus ([place]) . Diabetes Type II with retinopathy . Hyperlipidemia associated with type 2 diabetes mellitus ([place]) . CKD (chronic kidney disease), stage III ([place]) . Squamous cell carcinoma of nasal cavity (HCC)   Consults: Palliative medicine    Allergies:   Allergies  Allergen Reactions  . Tape Other (See Comments)    THE PATIENT'S SKIN IS VERY, VERY THIN- TEARS ANS PEELS OFF EASILY!!  . Alendronate Sodium Nausea Only and Other (See Comments)    Upset stomach, joint and muscle pain  . Codeine Sulfate Other (See Comments)    Dizziness, "put her to bed"  . Ibandronate Sodium Nausea Only and Other (See Comments)    Upset stomach, joint and muscle pain     DISCHARGE MEDICATIONS: Allergies as of 03/29/2021      Reactions   Tape Other (See Comments)   THE PATIENT'S SKIN IS VERY, VERY THIN- TEARS ANS PEELS OFF EASILY!!   Alendronate Sodium Nausea Only, Other (See Comments)   Upset stomach, joint and muscle pain   Codeine Sulfate Other (See Comments)   Dizziness, "put her to bed"   Ibandronate Sodium Nausea Only, Other (See Comments)   Upset stomach, joint and muscle pain      Medication List    STOP taking these medications   dexamethasone 1 MG tablet Commonly known as: DECADRON   diclofenac sodium 1 % Gel Commonly known as: VOLTAREN   metFORMIN 850 MG tablet Commonly known as: GLUCOPHAGE   OneTouch Ultra test strip Generic drug: glucose blood   prochlorperazine 10 MG tablet Commonly known as: COMPAZINE   traMADol 50 MG tablet Commonly  known as: ULTRAM   Vitamin D (Ergocalciferol) 1.25 MG (50000 UNIT) Caps capsule Commonly known as: DRISDOL   zolpidem 5 MG tablet Commonly known as: AMBIEN     TAKE these medications   glycopyrrolate 1 MG tablet Commonly known as: ROBINUL Take 1 tablet (1 mg total) by mouth every 4 (four) hours as needed (excessive secretions).   haloperidol 2 MG/ML solution Commonly known as: HALDOL Place 0.5 mLs (1 mg total) under the tongue every 4 (four) hours as needed for agitation (or delirium).   HYDROmorphone 25 mg in sodium chloride 0.9 % 47.5 mL Inject 0.5 mg/hr into the vein continuous.   levETIRAcetam 250 mg in sodium chloride 0.9 % 100 mL Inject 250 mg into the vein every 12 (twelve) hours.   LORazepam 2 MG/ML concentrated solution Commonly known as: ATIVAN Place 0.5 mLs (1 mg total) under the tongue every 4 (four) hours as needed for anxiety.   polyvinyl alcohol 1.4 % ophthalmic solution Commonly known as: LIQUIFILM TEARS Place 1 drop into both eyes 4 (four) times daily as needed for dry eyes.        Brief H and P: For complete details please refer to admission H and P, but in brief Patient is 87 year old female with history of hypertension, hyperlipidemia, diabetes, depression, carcinoma of the nasal cavity with brain cavity invasion (nonresectable, on palliative chemo) presented with seizures.  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 On the morning of admission, patient ate grits for breakfast and her daughter-in-law heard a loud noise.  Patient was walking with her walker, ran into the [place] cabinet but did not fall.  She seemed unable to respond or move her feet.  Patient eventually got to the bedroom and fell  asleep.  However she subsequently developed generalized tonic-clonic seizure activity and EMS was called.  Reportedly had 3 seizures at home.  She has not been responsive since her arrival in the ER.  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  Palliative medicine was consulted for goals of care. Patient's family requested comfort care status.   Hospital Course:   Status epilepticus [facility]) in the setting of invasive SCC of the nasal cavity that has invaded her brain -Patient had recently suffered falls, speech difficulty, diminished quality of life.  Family had already requested palliative care consultation which was scheduled for July. 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 -Chemotherapy had been stopped in April, on no further treatment since -Continue comfort care    Day of Discharge S: Unresponsive, family at the bedside  BP 110/63   Pulse 95   Temp 97.6 F (36.4 C) (Rectal)   Resp (!) 9   SpO2 (!) 87%   Physical Exam: General: Unresponsive, appears comfortable CVS: S1-S2 clear no murmur rubs or gallops Chest: CTA B anteriorly Abdomen: soft nontender, nondistended, normal bowel sounds Extremities: no edema noted bilaterally Neuro: does not follow verbal commands      The results of significant diagnostics from this hospitalization (including imaging, microbiology, ancillary and laboratory) are listed below for reference.      Procedures/Studies:  CT HEAD WO CONTRAST  Result Date: 03/28/2021 CLINICAL DATA:  Seizure EXAM: CT  HEAD WITHOUT CONTRAST TECHNIQUE: Contiguous axial images were obtained from the base of the skull through the vertex without intravenous contrast. COMPARISON:  Outside CT orbits, 04/22/2020 FINDINGS: Brain: No evidence of acute infarction, hemorrhage, hydrocephalus, extra-axial collection or mass lesion/mass effect. Periventricular white matter hypodensity. Vascular: No hyperdense vessel or unexpected calcification. Skull: Normal. Negative for fracture or focal lesion. Sinuses/Orbits: Redemonstrated soft tissue mass centered about the medial left orbit and adjacent ethmoid air cells, with substantial mass effect on the left globe. Although not optimally assessed on this examination of the brain, this appears to be slightly enlarged compared to prior outside examination of the orbits dated 04/22/2020. Other: None. IMPRESSION: 1. No acute intracranial pathology. Small-vessel white matter disease. 2. Redemonstrated soft tissue mass centered about the medial left orbit and adjacent ethmoid air cells, with substantial mass effect on the left globe. Although not optimally assessed on this examination of the brain, this appears to be slightly enlarged compared to prior outside examination of the orbits dated 04/22/2020. 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 Electronically Signed   By: Dorna Bloom.D.  On: 03/28/2021 13:07      LAB RESULTS: Basic Metabolic Panel: Recent Labs  Lab 03/28/21 1209  NA 136  K 3.7  CL 100  CO2 15*  GLUCOSE 312*  BUN 15  CREATININE 1.06*  CALCIUM 8.7*   Liver Function Tests: Recent Labs  Lab 03/28/21 1209  AST 23  ALT 9  ALKPHOS 135*  BILITOT 0.2*  PROT 5.9*  ALBUMIN 2.9*   No results for input(s): LIPASE, AMYLASE in the last 168 hours. No results for input(s): AMMONIA in the last 168 hours. CBC: Recent Labs  Lab 03/28/21 1209  WBC 17.8*  NEUTROABS 15.3*  HGB 9.5*  HCT 30.9*  MCV 104.7*  PLT 338   Cardiac Enzymes: No results for  input(s): CKTOTAL, CKMB, CKMBINDEX, TROPONINI in the last 168 hours. BNP: Invalid input(s): POCBNP CBG: Recent Labs  Lab 03/28/21 1223  GLUCAP 276*       Disposition and Follow-up:    DISPOSITION: Patient is being discharged to residential hospice, [place]    Time coordinating discharge:  25 minutes  Signed:   Estill Cotta M.D. [facility] 03/29/2021, 6:30 PM

## 2021-03-29 NOTE — ED Notes (Signed)
Coffee provided to family

## 2021-03-29 NOTE — Progress Notes (Signed)
Palliative:   Laura Salas is lying quietly on the stretcher in her ED room. She has been transitioned to comfort care and has been accepted to [facility] for comfort and dignity at end of life.  She appears acutely/chronically ill and frail, but comfortable.   She has no meaningful response to voice or touch.  She is unable to make er needs known.  Daughter in law is at bedside.    We talk about residential hospice timeframe, likely this afternoon.  We also talk about symptom management, Morphine for pain breathlessness.  Daughter in law also asks about, and we discuss, signs and symptoms that would indicate Laura Salas is nearing end of life.  Support given.   Conference with attending, bedisde nursing staff and TOC related to patient condition, needs, disposition.   Plan:   FULL COMFORT care. Residential Hospice with [facility] for comfort and dignity at end of life.   90 minutes Quinn Axe, NP Palliative Medicine Team  Team, Phone [phone] Greater than 50% of this time was spent counseling and coordinating care related to the above assessment and plan.

## 2021-03-29 NOTE — Progress Notes (Signed)
[facility]                                                                              Patient Demographics  Laura Salas, is a 87 y.o. 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 On the morning of admission, patient ate grits for breakfast and her daughter-in-law heard a loud noise.  Patient was walking with her walker, ran into the [place] cabinet but did not fall.  She seemed unable to respond or move her feet.  Patient eventually got to the bedroom and fell asleep.  However she subsequently developed generalized tonic-clonic seizure activity and EMS was called.  Reportedly had 3 seizures at home.  She has not been responsive since her arrival in the ER.  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  Palliative medicine was consulted for goals of care. Patient's family requested comfort care status, referral to [facility] has been placed  Assessment & Plan    Principal Problem:   Status epilepticus ([place]) in the setting of invasive SCC of the nasal cavity that has invaded her  brain -Patient had recently suffered falls, speech difficulty, diminished quality of life.  Family had already requested palliative care consultation which was scheduled for July. 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 -Chemotherapy had been stopped in April, on no further treatment since -Continue comfort care status, morphine drip, awaiting [facility]  Code Status: DNR DVT Prophylaxis:  Comfort care   Level of Care: Level of care: Palliative Care Family Communication: Discussed all imaging results, lab results, explained to the patient's daughter-in-law at the bedside   Disposition Plan:     Status is: Inpatient  Remains inpatient appropriate because:Inpatient level of care appropriate due to severity of illness   Dispo: The patient is from: Home              Anticipated d/c is to: Residential hospice  Patient currently is not medically stable to d/c.   Difficult to place patient No      Time Spent in minutes 25 minutes  Procedures:  None  Consultants:   Palliative medicine  Antimicrobials:   Anti-infectives (From admission, onward)   None          Medications  Scheduled Meds: . LORazepam  1 mg Intravenous Q4H   Continuous Infusions: . levETIRAcetam Stopped (03/29/21 0025)  . morphine 5 mg/hr (03/29/21 0758)   PRN Meds:.acetaminophen **OR** acetaminophen, antiseptic oral rinse, diphenhydrAMINE, glycopyrrolate **OR** glycopyrrolate **OR** glycopyrrolate, haloperidol **OR** haloperidol **OR** haloperidol lactate, LORazepam **OR** LORazepam **OR** LORazepam, morphine, ondansetron **OR** ondansetron (ZOFRAN)  IV, polyvinyl alcohol      Subjective:   Laura Salas was seen and examined today.  Currently unresponsive, appears to be comfortable, on morphine drip.  Daughter-in-law at the bedside  Objective:   Vitals:   03/29/21 0500 03/29/21 0530 03/29/21 0600 03/29/21 0708  BP:      Pulse: 82 93 87 100  Resp: 11 10 14 15   Temp:      TempSrc:      SpO2: (!) 88% (!) 83% 90% (!) 85%   No intake or output data in the 24 hours ending 03/29/21 0953   Wt Readings from Last 3 Encounters:  03/20/21 48.3 kg  03/04/21 47.6 kg  12/12/20 50.6 kg     Exam  General: Unresponsive, appears to be comfortable  Cardiovascular: S1 S2 auscultated, RRR  Respiratory: CTA B anteriorly  Gastrointestinal: Soft, ND, NBS  Ext: no pedal edema bilaterally  Neuro: unresponsive on morphine drip   Data Reviewed:  I have personally reviewed following labs and imaging studies  Micro Results No results found for this or any previous visit (from the past 240 hour(s)).  Radiology Reports CT HEAD WO CONTRAST  Result Date: 03/28/2021 CLINICAL DATA:  Seizure EXAM: CT HEAD WITHOUT CONTRAST TECHNIQUE: Contiguous axial images were obtained from the base of the skull through the vertex without intravenous contrast. COMPARISON:  Outside CT orbits, 04/22/2020 FINDINGS: Brain: No evidence of acute infarction, hemorrhage, hydrocephalus, extra-axial collection or mass lesion/mass effect. Periventricular white matter hypodensity. Vascular: No hyperdense vessel or unexpected calcification. Skull: Normal. Negative for fracture or focal lesion. Sinuses/Orbits: Redemonstrated soft tissue mass centered about the medial left orbit and adjacent ethmoid air cells, with substantial mass effect on the left globe. Although not optimally assessed on this examination of the brain, this appears to be slightly enlarged compared to prior outside examination of the orbits dated 04/22/2020. Other: None. IMPRESSION: 1. No acute intracranial  pathology. Small-vessel white matter disease. 2. Redemonstrated soft tissue mass centered about the medial left orbit and adjacent ethmoid air cells, with substantial mass effect on the left globe. Although not optimally assessed on this examination of the brain, this appears to be slightly enlarged compared to prior outside examination of the orbits dated 04/22/2020. 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 Electronically Signed   By: Eddie Candle M.D.   On: 03/28/2021 13:07    Lab Data:  CBC: Recent Labs  Lab 03/28/21 1209  WBC 17.8*  NEUTROABS 15.3*  HGB 9.5*  HCT 30.9*  MCV 104.7*  PLT 144   Basic Metabolic Panel: Recent Labs  Lab 03/28/21 1209  NA 136  K 3.7  CL 100  CO2 15*  GLUCOSE 312*  BUN 15  CREATININE 1.06*  CALCIUM 8.7*   GFR: Estimated Creatinine Clearance: 28.2 mL/min (A) (by C-G formula based on SCr  of 1.06 mg/dL (H)). Liver Function Tests: Recent Labs  Lab 03/28/21 1209  AST 23  ALT 9  ALKPHOS 135*  BILITOT 0.2*  PROT 5.9*  ALBUMIN 2.9*   No results for input(s): LIPASE, AMYLASE in the last 168 hours. No results for input(s): AMMONIA in the last 168 hours. Coagulation Profile: No results for input(s): INR, PROTIME in the last 168 hours. Cardiac Enzymes: No results for input(s): CKTOTAL, CKMB, CKMBINDEX, TROPONINI in the last 168 hours. BNP (last 3 results) No results for input(s): PROBNP in the last 8760 hours. HbA1C: No results for input(s): HGBA1C in the last 72 hours. CBG: Recent Labs  Lab 03/28/21 1223  GLUCAP 276*   Lipid Profile: No results for input(s): CHOL, HDL, LDLCALC, TRIG, CHOLHDL, LDLDIRECT in the last 72 hours. Thyroid Function Tests: No results for input(s): TSH, T4TOTAL, FREET4, T3FREE, THYROIDAB in the last 72 hours. Anemia Panel: No results for input(s): VITAMINB12, FOLATE, FERRITIN, TIBC, IRON, RETICCTPCT in the last 72 hours. Urine analysis:    Component Value Date/Time   COLORURINE YELLOW  03/28/2021 1209   APPEARANCEUR CLEAR 03/28/2021 1209   LABSPEC 1.014 03/28/2021 1209   PHURINE 5.0 03/28/2021 1209   GLUCOSEU >=500 (A) 03/28/2021 1209   HGBUR NEGATIVE 03/28/2021 1209   HGBUR 1+ 05/29/2010 1121   BILIRUBINUR NEGATIVE 03/28/2021 1209   BILIRUBINUR negative 03/04/2021 1011   KETONESUR 5 (A) 03/28/2021 1209   PROTEINUR 30 (A) 03/28/2021 1209   UROBILINOGEN 0.2 03/04/2021 1011   UROBILINOGEN 1.0 06/24/2013 0943   NITRITE NEGATIVE 03/28/2021 1209   LEUKOCYTESUR TRACE (A) 03/28/2021 1209     Brycelyn Gambino M.D. [facility] 03/29/2021, 9:53 AM  Available via Epic secure chat 7am-7pm After 7 pm, please refer to night coverage provider listed on amion.

## 2021-03-29 NOTE — ED Notes (Signed)
The pts sats areslowly  Decreasing.  She now has a vein in her neck on the right that is pulsating more

## 2021-03-29 NOTE — ED Notes (Signed)
Breakfast Ordered 

## 2021-03-29 NOTE — ED Notes (Signed)
Advised by RN to not take BP in order to not disturb patient

## 2021-03-29 NOTE — ED Notes (Signed)
Pt's son came to nurses station to ask about why the covid test isn't back yet. Explained to pt's son that the test can take anywhere between 42-24hrs to come back. Pt's son voiced concern about losing bed status at [facility]. Primary RN explained that [employer] knows that we are waiting on the covid results and that they are holding the bed. Pt's son visibly upset.

## 2021-03-29 NOTE — Progress Notes (Signed)
Manufacturing engineer [facility]) Hospital Liaison note.     This patient is approved to transfer to [facility] today pending negative Covid test.   Consents are complete and transfer can be set up as soon as negative Covid test results are confirmed.  RN please call report to [phone].   Thank you,     Farrel Gordon, RN, [facility] Liaison  [phone]

## 2021-03-29 NOTE — ED Notes (Signed)
Her son remains at  The bedside.   The son remarks that the pt keeps putting her hand up rubbing the rt side of her faced

## 2021-03-29 NOTE — ED Notes (Signed)
The pt making more sounds

## 2021-03-29 NOTE — ED Notes (Signed)
This RN called facility to update that patient is being transported. Facility aware that IV is in place during transport and saline locked.

## 2021-04-01 ENCOUNTER — Telehealth: Payer: Self-pay | Admitting: Family Medicine

## 2021-04-01 NOTE — Telephone Encounter (Signed)
I tried to reach patient and/or son-was not able to leave voicemail.  Wanted to check in as she has transitioned to [place]

## 2021-04-10 ENCOUNTER — Other Ambulatory Visit: Payer: Self-pay | Admitting: Nurse Practitioner

## 2021-04-24 ENCOUNTER — Other Ambulatory Visit: Payer: Self-pay | Admitting: Nurse Practitioner

## 2021-04-24 DEATH — deceased

## 2021-06-26 ENCOUNTER — Ambulatory Visit: Payer: Medicare HMO | Admitting: Family Medicine

## 2022-01-14 IMAGING — CT CT HEAD W/O CM
4 series · 17 of 47 positions shown, 19 images · non-contrast
Comparison: Outside CT orbits, 04/22/2020

CLINICAL DATA: Seizure

EXAM:
CT HEAD WITHOUT CONTRAST
TECHNIQUE: Contiguous axial images were obtained from the base of the skull
through the vertex without intravenous contrast.

[Series 3: head wo · axial · 0.44mm/px · z∈[-160,-40]mm · 7 of 34 slices shown, 9 images]
[im 5/34  brain]
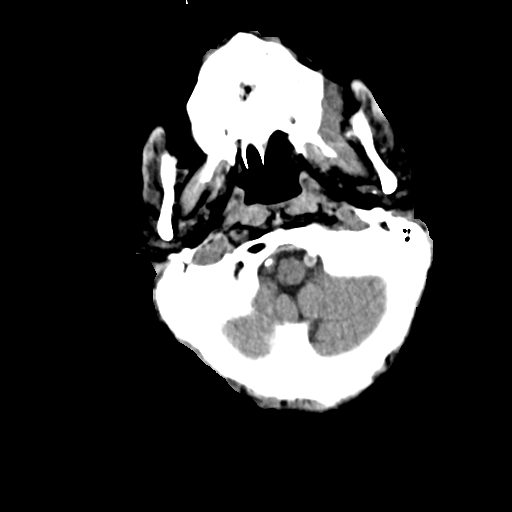
[im 5/34  bone]
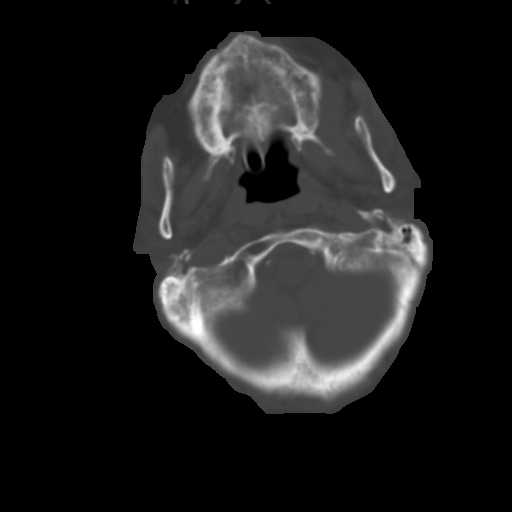
[im 9/34  brain]
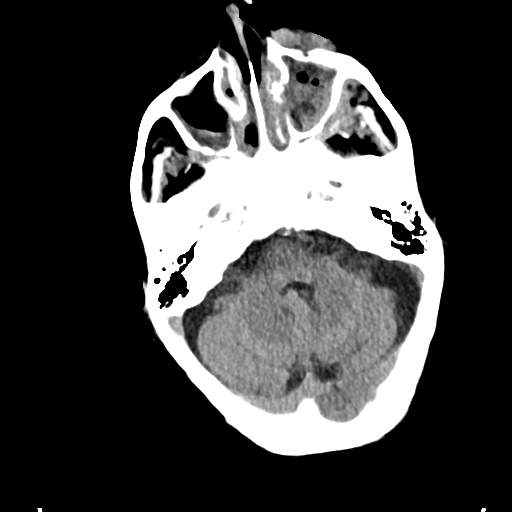
[im 13/34  brain]
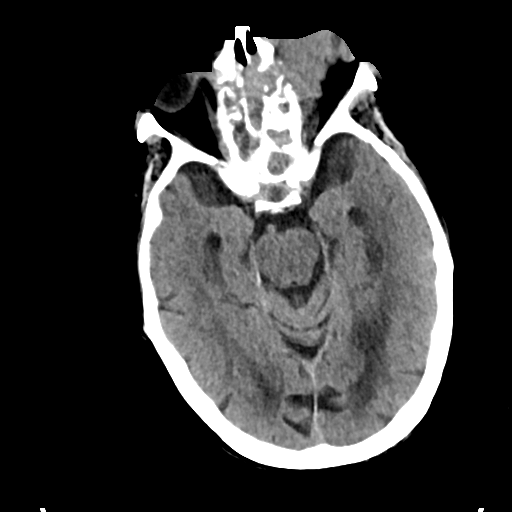
[im 17/34  brain]
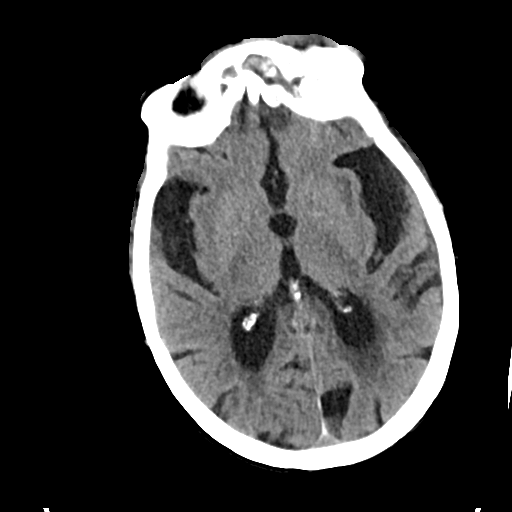
[im 21/34  brain]
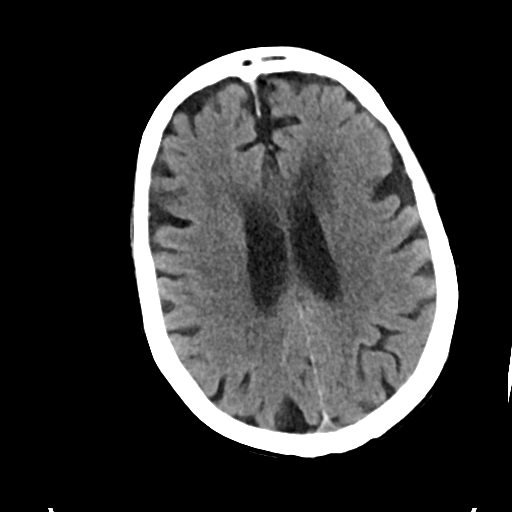
[im 21/34  bone]
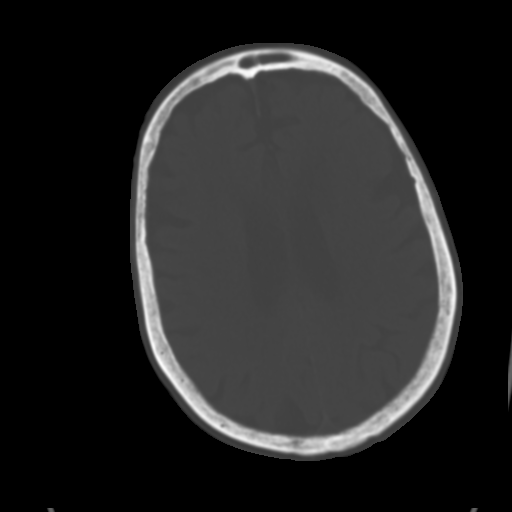
[im 25/34  brain]
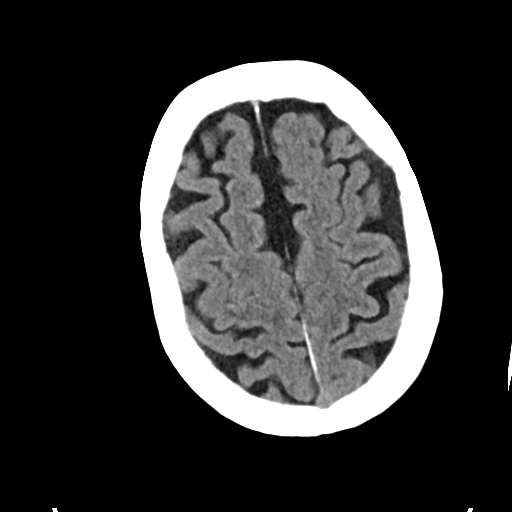
[im 29/34  brain]
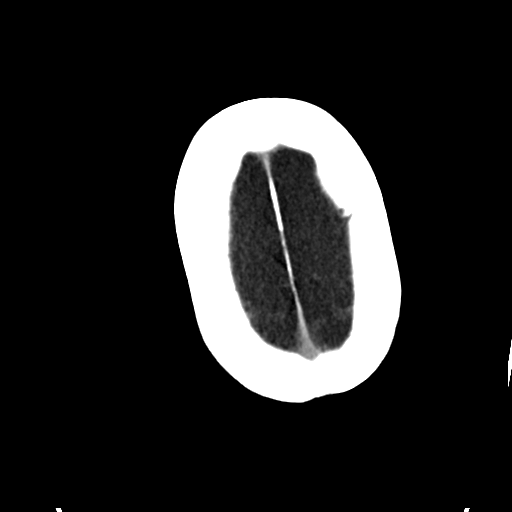

[Series 4: head bone · axial · 0.44mm/px · z∈[-164,-106]mm · 4 of 84 slices shown]
[im 9/84  bone]
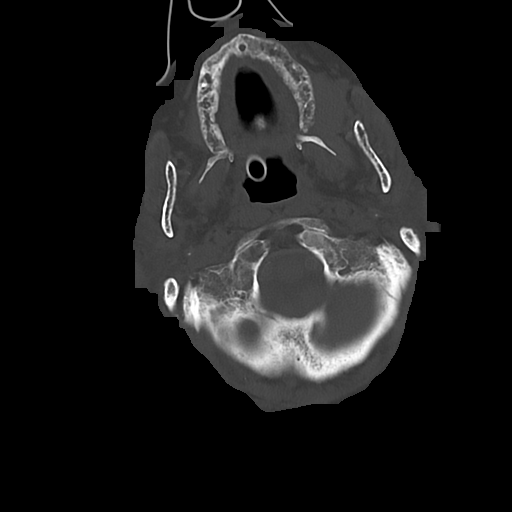
[im 17/84  bone]
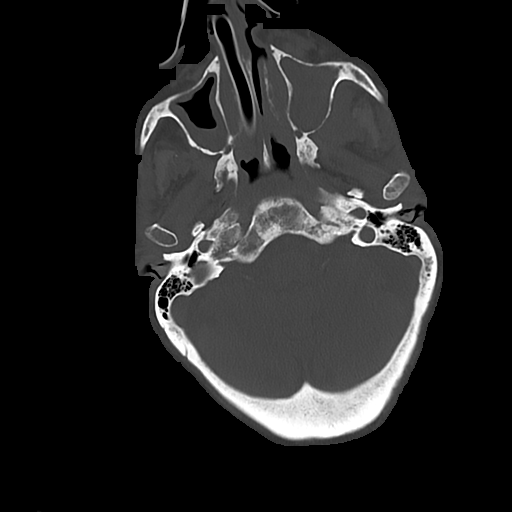
[im 25/84  bone]
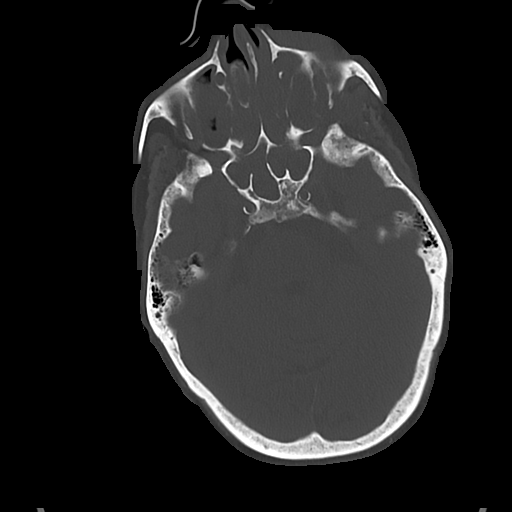
[im 38/84  bone]
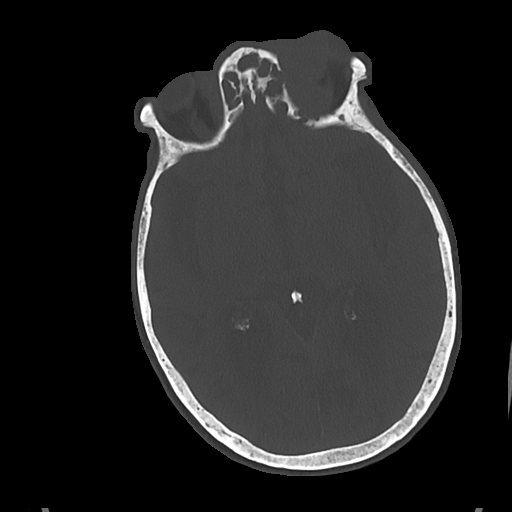

[Series 5: cor soft · coronal · 0.33mm/px · 3 of 74 slices shown]
[im 28/74  brain]
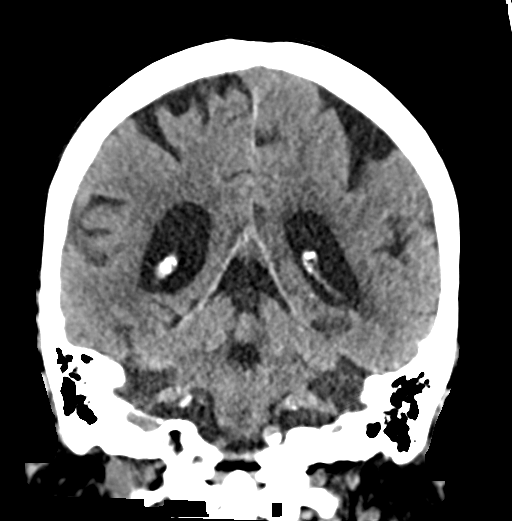
[im 34/74  brain]
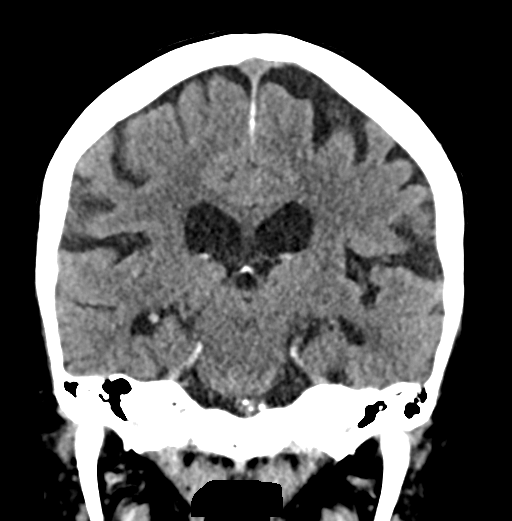
[im 40/74  brain]
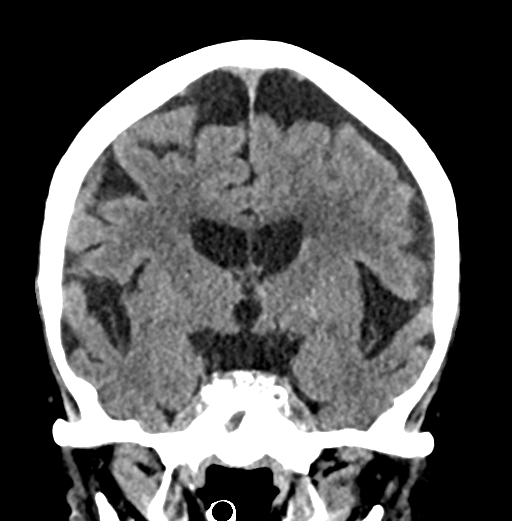

[Series 6: sag soft · sagittal · 0.37mm/px · 3 of 57 slices shown]
[im 19/57  brain]
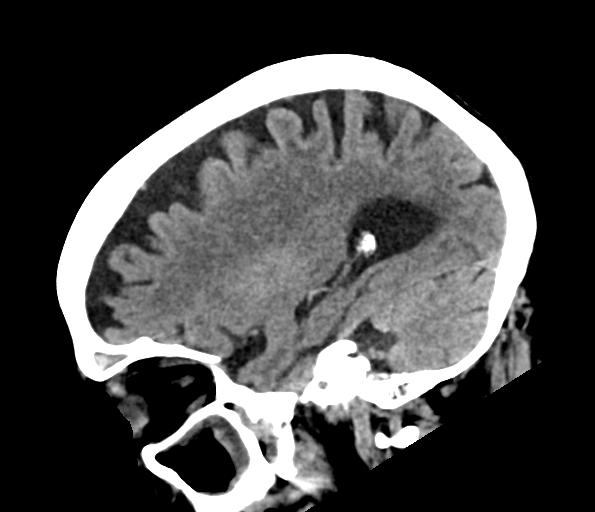
[im 29/57  brain]
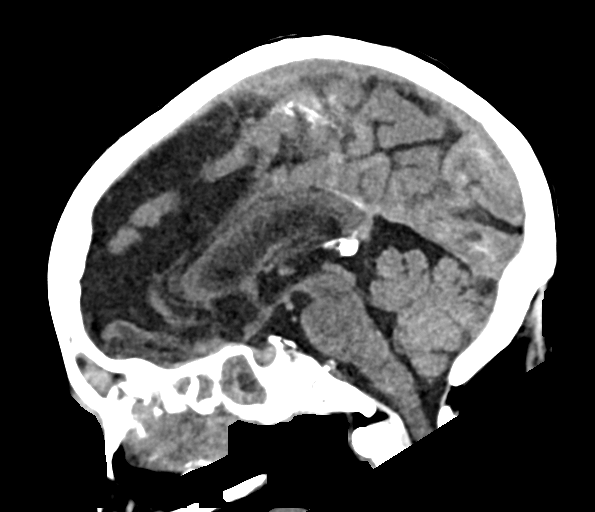
[im 38/57  brain]
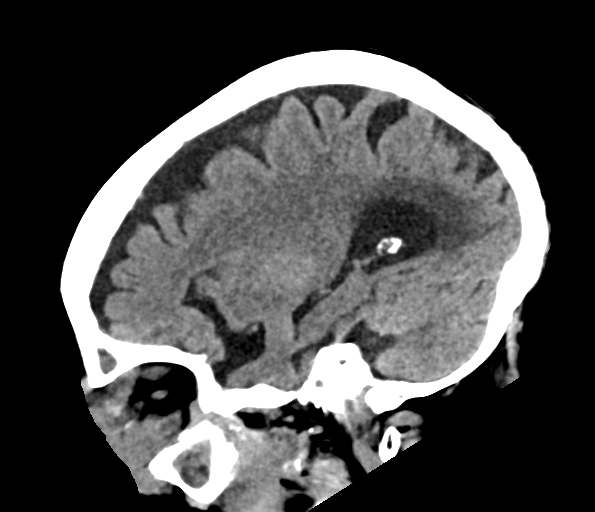

[17 of 47 positions shown; findings below may reference images not displayed]

FINDINGS: Brain: No evidence of acute infarction, hemorrhage, hydrocephalus,
extra-axial collection or mass lesion/mass effect. Periventricular
white matter hypodensity.

Vascular: No hyperdense vessel or unexpected calcification.

Skull: Normal. Negative for fracture or focal lesion.

Sinuses/Orbits: Redemonstrated soft tissue mass centered about the
medial left orbit and adjacent ethmoid air cells, with substantial
mass effect on the left globe. Although not optimally assessed on
this examination of the brain, this appears to be slightly enlarged
compared to prior outside examination of the orbits dated
04/22/2020.

Other: None.
IMPRESSION: 1. No acute intracranial pathology. Small-vessel white matter
disease.
2. Redemonstrated soft tissue mass centered about the medial left
orbit and adjacent ethmoid air cells, with substantial mass effect
on the left globe. Although not optimally assessed on this
examination of the brain, this appears to be slightly enlarged
compared to prior outside examination of the orbits dated
04/22/2020. This may reflect nasopharyngeal carcinoma or lymphoma.

## 2022-02-05 ENCOUNTER — Ambulatory Visit: Payer: Medicare HMO
